# Patient Record
Sex: Female | Born: 1935 | Race: White | Hispanic: No | Smoking: Never smoker
Health system: Southern US, Community
[De-identification: ages and names within clinical notes are randomized; demographics above are authoritative.]

## PROBLEM LIST (undated history)

## (undated) DIAGNOSIS — M810 Age-related osteoporosis without current pathological fracture: Secondary | ICD-10-CM

## (undated) DIAGNOSIS — K219 Gastro-esophageal reflux disease without esophagitis: Secondary | ICD-10-CM

## (undated) DIAGNOSIS — Z974 Presence of external hearing-aid: Secondary | ICD-10-CM

## (undated) DIAGNOSIS — I5031 Acute diastolic (congestive) heart failure: Secondary | ICD-10-CM

## (undated) DIAGNOSIS — N289 Disorder of kidney and ureter, unspecified: Secondary | ICD-10-CM

## (undated) DIAGNOSIS — R42 Dizziness and giddiness: Secondary | ICD-10-CM

## (undated) DIAGNOSIS — H5316 Psychophysical visual disturbances: Secondary | ICD-10-CM

## (undated) DIAGNOSIS — C55 Malignant neoplasm of uterus, part unspecified: Secondary | ICD-10-CM

## (undated) DIAGNOSIS — I10 Essential (primary) hypertension: Secondary | ICD-10-CM

## (undated) DIAGNOSIS — Z9889 Other specified postprocedural states: Secondary | ICD-10-CM

## (undated) DIAGNOSIS — H3530 Unspecified macular degeneration: Secondary | ICD-10-CM

## (undated) DIAGNOSIS — H409 Unspecified glaucoma: Secondary | ICD-10-CM

## (undated) DIAGNOSIS — M4150 Other secondary scoliosis, site unspecified: Secondary | ICD-10-CM

## (undated) DIAGNOSIS — E875 Hyperkalemia: Secondary | ICD-10-CM

## (undated) DIAGNOSIS — I773 Arterial fibromuscular dysplasia: Secondary | ICD-10-CM

## (undated) DIAGNOSIS — E7800 Pure hypercholesterolemia, unspecified: Secondary | ICD-10-CM

## (undated) HISTORY — DX: Hyperkalemia: E87.5

## (undated) HISTORY — DX: Disorder of kidney and ureter, unspecified: N28.9

## (undated) HISTORY — DX: Unspecified macular degeneration: H35.30

## (undated) HISTORY — DX: Pure hypercholesterolemia, unspecified: E78.00

## (undated) HISTORY — DX: Other secondary scoliosis, site unspecified: M41.50

## (undated) HISTORY — DX: Age-related osteoporosis without current pathological fracture: M81.0

## (undated) HISTORY — PX: TOTAL ABDOMINAL HYSTERECTOMY W/ BILATERAL SALPINGOOPHORECTOMY: SHX83

## (undated) HISTORY — PX: REFRACTIVE SURGERY: SHX103

## (undated) HISTORY — DX: Malignant neoplasm of uterus, part unspecified: C55

## (undated) HISTORY — DX: Unspecified glaucoma: H40.9

## (undated) HISTORY — DX: Gastro-esophageal reflux disease without esophagitis: K21.9

## (undated) HISTORY — DX: Essential (primary) hypertension: I10

## (undated) HISTORY — DX: Other specified postprocedural states: Z98.890

## (undated) HISTORY — DX: Hypercalcemia: E83.52

## (undated) HISTORY — DX: Dizziness and giddiness: R42

## (undated) HISTORY — PX: CARDIAC CATHETERIZATION: SHX172

## (undated) HISTORY — DX: Acute diastolic (congestive) heart failure: I50.31

## (undated) HISTORY — DX: Arterial fibromuscular dysplasia: I77.3

---

## 2001-04-02 ENCOUNTER — Encounter: Admission: RE | Admit: 2001-04-02 | Discharge: 2001-04-02 | Payer: Self-pay | Admitting: Internal Medicine

## 2001-04-02 ENCOUNTER — Encounter: Payer: Self-pay | Admitting: Internal Medicine

## 2002-04-10 ENCOUNTER — Encounter: Payer: Self-pay | Admitting: Internal Medicine

## 2002-04-10 ENCOUNTER — Encounter: Admission: RE | Admit: 2002-04-10 | Discharge: 2002-04-10 | Payer: Self-pay | Admitting: Internal Medicine

## 2002-05-28 DIAGNOSIS — Z9889 Other specified postprocedural states: Secondary | ICD-10-CM

## 2002-05-28 HISTORY — DX: Other specified postprocedural states: Z98.890

## 2002-05-29 ENCOUNTER — Ambulatory Visit (HOSPITAL_COMMUNITY): Admission: RE | Admit: 2002-05-29 | Discharge: 2002-05-29 | Payer: Self-pay | Admitting: Cardiology

## 2002-05-29 ENCOUNTER — Encounter: Payer: Self-pay | Admitting: Cardiology

## 2002-06-11 ENCOUNTER — Ambulatory Visit (HOSPITAL_COMMUNITY): Admission: RE | Admit: 2002-06-11 | Discharge: 2002-06-11 | Payer: Self-pay | Admitting: Cardiology

## 2003-04-15 ENCOUNTER — Encounter: Payer: Self-pay | Admitting: Internal Medicine

## 2003-04-15 ENCOUNTER — Encounter: Admission: RE | Admit: 2003-04-15 | Discharge: 2003-04-15 | Payer: Self-pay | Admitting: *Deleted

## 2004-04-14 ENCOUNTER — Ambulatory Visit (HOSPITAL_COMMUNITY): Admission: RE | Admit: 2004-04-14 | Discharge: 2004-04-14 | Payer: Self-pay | Admitting: Neurosurgery

## 2004-06-17 ENCOUNTER — Encounter: Admission: RE | Admit: 2004-06-17 | Discharge: 2004-06-17 | Payer: Self-pay | Admitting: Internal Medicine

## 2004-10-11 ENCOUNTER — Ambulatory Visit: Payer: Self-pay | Admitting: Internal Medicine

## 2004-10-28 ENCOUNTER — Ambulatory Visit: Payer: Self-pay | Admitting: Internal Medicine

## 2005-06-20 ENCOUNTER — Encounter: Admission: RE | Admit: 2005-06-20 | Discharge: 2005-06-20 | Payer: Self-pay | Admitting: Internal Medicine

## 2005-10-17 ENCOUNTER — Ambulatory Visit: Payer: Self-pay | Admitting: Internal Medicine

## 2005-10-28 ENCOUNTER — Ambulatory Visit: Payer: Self-pay | Admitting: Internal Medicine

## 2006-06-30 ENCOUNTER — Ambulatory Visit (HOSPITAL_COMMUNITY): Admission: RE | Admit: 2006-06-30 | Discharge: 2006-06-30 | Payer: Self-pay | Admitting: Neurosurgery

## 2006-07-04 ENCOUNTER — Encounter: Admission: RE | Admit: 2006-07-04 | Discharge: 2006-07-04 | Payer: Self-pay | Admitting: Internal Medicine

## 2006-08-09 ENCOUNTER — Emergency Department: Payer: Self-pay | Admitting: Emergency Medicine

## 2006-08-09 ENCOUNTER — Other Ambulatory Visit: Payer: Self-pay

## 2006-08-23 ENCOUNTER — Ambulatory Visit: Payer: Self-pay | Admitting: Internal Medicine

## 2006-09-13 ENCOUNTER — Encounter: Payer: Self-pay | Admitting: Cardiology

## 2007-04-03 ENCOUNTER — Ambulatory Visit: Payer: Self-pay | Admitting: Gynecologic Oncology

## 2007-04-11 ENCOUNTER — Encounter: Payer: Self-pay | Admitting: Cardiology

## 2007-04-12 ENCOUNTER — Ambulatory Visit: Payer: Self-pay | Admitting: Obstetrics and Gynecology

## 2007-04-12 ENCOUNTER — Ambulatory Visit: Payer: Self-pay | Admitting: Internal Medicine

## 2007-04-13 ENCOUNTER — Inpatient Hospital Stay: Payer: Self-pay | Admitting: Obstetrics and Gynecology

## 2007-05-01 ENCOUNTER — Ambulatory Visit: Payer: Self-pay | Admitting: Gynecologic Oncology

## 2007-05-01 ENCOUNTER — Ambulatory Visit: Payer: Self-pay | Admitting: Radiation Oncology

## 2007-05-29 ENCOUNTER — Ambulatory Visit: Payer: Self-pay | Admitting: Radiation Oncology

## 2007-05-29 ENCOUNTER — Ambulatory Visit: Payer: Self-pay | Admitting: Gynecologic Oncology

## 2007-06-29 ENCOUNTER — Ambulatory Visit: Payer: Self-pay | Admitting: Radiation Oncology

## 2007-07-09 ENCOUNTER — Encounter: Admission: RE | Admit: 2007-07-09 | Discharge: 2007-07-09 | Payer: Self-pay | Admitting: Internal Medicine

## 2007-09-25 ENCOUNTER — Encounter: Payer: Self-pay | Admitting: Cardiology

## 2007-12-11 ENCOUNTER — Ambulatory Visit: Payer: Self-pay | Admitting: Radiation Oncology

## 2007-12-30 ENCOUNTER — Ambulatory Visit: Payer: Self-pay | Admitting: Radiation Oncology

## 2008-02-27 ENCOUNTER — Ambulatory Visit: Payer: Self-pay | Admitting: Gynecologic Oncology

## 2008-03-20 ENCOUNTER — Ambulatory Visit: Payer: Self-pay | Admitting: Obstetrics and Gynecology

## 2008-04-01 ENCOUNTER — Ambulatory Visit: Payer: Self-pay | Admitting: Obstetrics and Gynecology

## 2008-04-28 ENCOUNTER — Ambulatory Visit: Payer: Self-pay | Admitting: Radiation Oncology

## 2008-05-26 ENCOUNTER — Ambulatory Visit: Payer: Self-pay | Admitting: Radiation Oncology

## 2008-05-28 ENCOUNTER — Ambulatory Visit: Payer: Self-pay | Admitting: Radiation Oncology

## 2008-07-16 ENCOUNTER — Ambulatory Visit: Payer: Self-pay | Admitting: Obstetrics and Gynecology

## 2008-07-17 ENCOUNTER — Encounter: Admission: RE | Admit: 2008-07-17 | Discharge: 2008-07-17 | Payer: Self-pay | Admitting: Internal Medicine

## 2008-07-31 ENCOUNTER — Ambulatory Visit: Payer: Self-pay | Admitting: Internal Medicine

## 2008-09-25 ENCOUNTER — Encounter: Payer: Self-pay | Admitting: Cardiology

## 2008-10-16 ENCOUNTER — Encounter: Payer: Self-pay | Admitting: Cardiology

## 2008-10-17 ENCOUNTER — Encounter: Payer: Self-pay | Admitting: Cardiology

## 2008-10-21 ENCOUNTER — Encounter: Payer: Self-pay | Admitting: Cardiology

## 2008-12-11 ENCOUNTER — Ambulatory Visit: Payer: Self-pay | Admitting: Internal Medicine

## 2008-12-18 ENCOUNTER — Ambulatory Visit: Payer: Self-pay | Admitting: Internal Medicine

## 2009-02-17 ENCOUNTER — Encounter: Admission: RE | Admit: 2009-02-17 | Discharge: 2009-02-17 | Payer: Self-pay | Admitting: Neurosurgery

## 2009-03-19 ENCOUNTER — Ambulatory Visit: Payer: Self-pay | Admitting: Obstetrics and Gynecology

## 2009-06-17 ENCOUNTER — Encounter (INDEPENDENT_AMBULATORY_CARE_PROVIDER_SITE_OTHER): Payer: Self-pay | Admitting: *Deleted

## 2009-06-17 LAB — CONVERTED CEMR LAB
Albumin: 4.1 g/dL
Alkaline Phosphatase: 57 units/L
BUN: 34 mg/dL
CO2: 26.4 meq/L
Calcium: 9.9 mg/dL
Cholesterol: 221 mg/dL
Creatinine, Ser: 1.3 mg/dL
HCT: 39.9 %
Hemoglobin: 12.8 g/dL
MCV: 97.8 fL
Platelets: 210 10*3/uL
RBC: 4.08 M/uL
RDW: 13.6 %
Sodium: 139.8 meq/L
Total Bilirubin: 0.8 mg/dL
Total Protein: 7.2 g/dL

## 2009-06-19 ENCOUNTER — Encounter: Payer: Self-pay | Admitting: Cardiology

## 2009-06-23 ENCOUNTER — Other Ambulatory Visit: Payer: Self-pay | Admitting: Internal Medicine

## 2009-07-06 ENCOUNTER — Encounter: Payer: Self-pay | Admitting: Cardiology

## 2009-07-07 ENCOUNTER — Ambulatory Visit: Payer: Self-pay | Admitting: Cardiology

## 2009-07-07 DIAGNOSIS — R0602 Shortness of breath: Secondary | ICD-10-CM | POA: Insufficient documentation

## 2009-07-07 DIAGNOSIS — I5032 Chronic diastolic (congestive) heart failure: Secondary | ICD-10-CM

## 2009-07-08 ENCOUNTER — Encounter (INDEPENDENT_AMBULATORY_CARE_PROVIDER_SITE_OTHER): Payer: Self-pay | Admitting: *Deleted

## 2009-07-09 ENCOUNTER — Encounter: Payer: Self-pay | Admitting: Cardiology

## 2009-07-10 ENCOUNTER — Encounter: Payer: Self-pay | Admitting: Cardiology

## 2009-07-14 ENCOUNTER — Encounter: Payer: Self-pay | Admitting: Cardiology

## 2009-07-14 ENCOUNTER — Ambulatory Visit: Payer: Self-pay

## 2009-07-19 ENCOUNTER — Emergency Department: Payer: Self-pay | Admitting: Emergency Medicine

## 2009-07-20 ENCOUNTER — Telehealth (INDEPENDENT_AMBULATORY_CARE_PROVIDER_SITE_OTHER): Payer: Self-pay | Admitting: *Deleted

## 2009-07-22 ENCOUNTER — Ambulatory Visit: Payer: Self-pay | Admitting: Internal Medicine

## 2009-07-23 ENCOUNTER — Telehealth (INDEPENDENT_AMBULATORY_CARE_PROVIDER_SITE_OTHER): Payer: Self-pay | Admitting: *Deleted

## 2009-07-27 ENCOUNTER — Encounter: Payer: Self-pay | Admitting: Cardiology

## 2009-07-27 ENCOUNTER — Telehealth: Payer: Self-pay | Admitting: Cardiology

## 2009-08-05 ENCOUNTER — Telehealth (INDEPENDENT_AMBULATORY_CARE_PROVIDER_SITE_OTHER): Payer: Self-pay | Admitting: *Deleted

## 2009-08-16 ENCOUNTER — Encounter: Payer: Self-pay | Admitting: Cardiology

## 2009-09-02 ENCOUNTER — Telehealth (INDEPENDENT_AMBULATORY_CARE_PROVIDER_SITE_OTHER): Payer: Self-pay | Admitting: Physician Assistant

## 2009-09-08 ENCOUNTER — Ambulatory Visit: Payer: Self-pay | Admitting: Cardiovascular Disease

## 2009-09-08 ENCOUNTER — Telehealth: Payer: Self-pay | Admitting: Cardiology

## 2009-09-08 DIAGNOSIS — R079 Chest pain, unspecified: Secondary | ICD-10-CM

## 2009-09-16 ENCOUNTER — Ambulatory Visit: Payer: Self-pay

## 2009-09-16 ENCOUNTER — Encounter: Payer: Self-pay | Admitting: Cardiovascular Disease

## 2009-09-16 ENCOUNTER — Ambulatory Visit: Payer: Self-pay | Admitting: Cardiology

## 2009-09-17 ENCOUNTER — Telehealth: Payer: Self-pay | Admitting: Cardiology

## 2009-09-17 ENCOUNTER — Encounter: Payer: Self-pay | Admitting: Cardiology

## 2009-09-22 ENCOUNTER — Ambulatory Visit: Payer: Self-pay | Admitting: Cardiology

## 2009-09-22 DIAGNOSIS — E7800 Pure hypercholesterolemia, unspecified: Secondary | ICD-10-CM

## 2009-10-30 ENCOUNTER — Ambulatory Visit: Payer: Self-pay | Admitting: Cardiology

## 2009-10-30 DIAGNOSIS — R609 Edema, unspecified: Secondary | ICD-10-CM | POA: Insufficient documentation

## 2009-11-03 ENCOUNTER — Ambulatory Visit: Payer: Self-pay

## 2009-11-03 ENCOUNTER — Encounter: Payer: Self-pay | Admitting: Cardiology

## 2009-11-05 ENCOUNTER — Encounter: Payer: Self-pay | Admitting: Cardiology

## 2009-11-05 ENCOUNTER — Ambulatory Visit: Payer: Self-pay | Admitting: Cardiology

## 2009-11-09 LAB — CONVERTED CEMR LAB
BUN: 44 mg/dL — ABNORMAL HIGH (ref 6–23)
CO2: 22 meq/L (ref 19–32)
Calcium: 10.1 mg/dL (ref 8.4–10.5)
Glucose, Bld: 102 mg/dL — ABNORMAL HIGH (ref 70–99)
Sodium: 134 meq/L — ABNORMAL LOW (ref 135–145)

## 2009-11-24 ENCOUNTER — Ambulatory Visit: Payer: Self-pay | Admitting: Cardiology

## 2009-11-30 LAB — CONVERTED CEMR LAB
CO2: 17 meq/L — ABNORMAL LOW (ref 19–32)
Calcium: 10.3 mg/dL (ref 8.4–10.5)
Creatinine, Ser: 1.37 mg/dL — ABNORMAL HIGH (ref 0.40–1.20)
Glucose, Bld: 88 mg/dL (ref 70–99)
Sodium: 139 meq/L (ref 135–145)

## 2010-01-12 ENCOUNTER — Encounter: Payer: Self-pay | Admitting: Cardiovascular Disease

## 2010-02-01 ENCOUNTER — Encounter: Payer: Self-pay | Admitting: Cardiovascular Disease

## 2010-02-01 LAB — CONVERTED CEMR LAB
CO2: 22 meq/L
Calcium: 9.7 mg/dL
Creatinine, Ser: 1.32 mg/dL
Glucose, Bld: 87 mg/dL
Sodium: 137 meq/L

## 2010-02-03 ENCOUNTER — Ambulatory Visit: Payer: Self-pay | Admitting: Cardiovascular Disease

## 2010-02-04 ENCOUNTER — Telehealth: Payer: Self-pay | Admitting: Cardiovascular Disease

## 2010-02-05 ENCOUNTER — Encounter: Payer: Self-pay | Admitting: Cardiovascular Disease

## 2010-03-16 ENCOUNTER — Telehealth: Payer: Self-pay | Admitting: Cardiovascular Disease

## 2010-05-18 ENCOUNTER — Other Ambulatory Visit: Payer: Self-pay | Admitting: Internal Medicine

## 2010-05-20 ENCOUNTER — Ambulatory Visit: Payer: Self-pay | Admitting: Internal Medicine

## 2010-06-01 ENCOUNTER — Other Ambulatory Visit: Payer: Self-pay | Admitting: Internal Medicine

## 2010-06-04 ENCOUNTER — Ambulatory Visit: Payer: Self-pay | Admitting: Surgery

## 2010-06-08 ENCOUNTER — Encounter: Payer: Self-pay | Admitting: Cardiovascular Disease

## 2010-06-15 ENCOUNTER — Encounter: Payer: Self-pay | Admitting: Cardiovascular Disease

## 2010-06-16 ENCOUNTER — Ambulatory Visit: Payer: Self-pay | Admitting: Cardiovascular Disease

## 2010-09-02 ENCOUNTER — Ambulatory Visit: Payer: Self-pay | Admitting: Obstetrics and Gynecology

## 2010-10-12 ENCOUNTER — Ambulatory Visit: Payer: Self-pay | Admitting: Nephrology

## 2010-12-20 ENCOUNTER — Encounter: Payer: Self-pay | Admitting: Neurosurgery

## 2010-12-28 NOTE — Progress Notes (Signed)
Summary: BP PROBLEMS  Phone Note Call from Patient Call back at Home Phone [phone]   Caller: SELF Call For: GOLLAN Summary of Call: PT IS HAVING BP PROBLEMS-LOSARTIN HAS REPLACED AVAPRO-KIDNEY DR UPPED FUROSEMIDE TO 40 MG IN THE MORNING-TAKING BYSTOLLIC 2 X A DAY-BP STARTED 629/52 AND PULSE 66 NOW IT IS 154/73 WITH A PULSE OF 58 Initial call taken by: Harlon Flor,  March 16, 2010 10:41 AM  Follow-up for Phone Call        pt instructed to use hydralazine as needed as previously ordered.  pt will take a 1/2 tab now and will call back if BP has not improved (but only after taking a full pill).  Follow-up by: Charlena Cross, RN, BSN,  March 16, 2010 11:01 AM

## 2010-12-28 NOTE — Progress Notes (Signed)
Summary: MEDS  Phone Note Call from Patient Call back at Home Phone [phone]   Caller: HUSBAND Call For: [facility] Summary of Call: QUESTIONS ABOUT MEDS THAT WERE PICKED UP YESTERDAY #[phone] Initial call taken by: Harlon Flor,  February 04, 2010 8:41 AM  Follow-up for Phone Call        pts husband called to clarify medications- instructed pt that there was no generic for Avapro so she was placed on cozaar (losartan) 100 mg daily.  Pt was under the impression that she would be taking 50 mg twice a day, instructed pt that she was able to cut medication in half if that would be better for her.  Instructed her on dosing instructions for hydralazine as they did not wait after appt yesterday.  will call pt back later once we rec labs from kidney MD in regards to lasix dose. Follow-up by: Charlena Cross, RN, BSN,  February 04, 2010 9:46 AM

## 2010-12-28 NOTE — Assessment & Plan Note (Signed)
Summary: rov   Visit Type:  rov Referring Provider:  Shirlee Latch Primary Provider:  Dale   CC:  here due to insurance not wanting to pay for avapro. had a cold for 2 weeks and took lots of zinc cough drops about a week. Jill Castro  History of Present Illness: 77 yo WF with history of HTN and diastolic CHF presents for followup.  catheterization in 2003 showing luminal irregularities, negative stress test in October 2010 with no ischemia, echocardiogram August 2010 showing normal systolic function, mild to moderate tricuspid regurgitation, mildly elevated right ventricular systolic pressures consistent with mild pulmonary hypertension, ejection fraction 60%.  She states that she had a terrible fall late last year and call significant injury to her right lower leg below the knee with severe bruising. She's been able to do some treadmill though continues to have pain in the lower leg as well as the right ankle.  Her blood pressures have been mildly elevated with systolics in the 140s to 150s. She checks them 4 times a day with her husband's help.  Labs (8/10): LDL 130, HDL 66, TSH normal, creatinine 1.3  Current Problems (verified): 1)  Edema  (ICD-782.3) 2)  Hyperlipidemia-mixed  (ICD-272.4) 3)  Chest Pain  (ICD-786.50) 4)  Hypertension, Unspecified  (ICD-401.9) 5)  Diastolic Heart Failure, Chronic  (ICD-428.32) 6)  Shortness of Breath  (ICD-786.05)  Current Medications (verified): 1)  Dorzolamide Hcl 2 % Soln (Dorzolamide Hcl) .Jill Castro.. 1 Drop in Each Eye Morning & Evening 2)  Travatan Z 0.004 % Soln (Travoprost) .Jill Castro.. 1 Drop in Each Eye At Bedtime 3)  Omeprazole 20 Mg Tbec (Omeprazole) .Jill Castro.. 1-2 Tab Once Daily 4)  Avapro 150 Mg Tabs (Irbesartan) .Jill Castro.. 1 Tab Two Times A Day 5)  Furosemide 20 Mg Tabs (Furosemide) .... 2 By Mouth Once Daily 6)  Multivitamins   Tabs (Multiple Vitamin) .Jill Castro.. 1 Tab Once Daily 7)  Aspirin 81 Mg Tbec (Aspirin) .... Take One Tablet By Mouth Daily 8)  Vitamin C 500 Mg  Tabs (Ascorbic Acid) .Jill Castro.. 1 Tab Once Daily 9)  Vitamin D 400 Unit Caps (Cholecalciferol) .Jill Castro.. 1 Cap Once Daily 10)  B-Complex/b-12  Tabs (B Complex Vitamins) .Jill Castro.. 1 Tab Once Daily 11)  Magnesium 500 Mg Tabs (Magnesium) .Jill Castro.. 1 Tab Once Daily 12)  Glucosamine-Chondroitin 1500-1200 Mg/60ml Liqd (Glucosamine-Chondroitin) .Jill Castro.. 1 Tab  Once Daily 13)  Docusate Sodium 100 Mg Caps (Docusate Sodium) .Jill Castro.. 1 Cap At Bedtime 14)  Bystolic 2.5 Mg Tabs (Nebivolol Hcl) .Jill Castro.. 1 Tab By Mouth  Two Times A Day 15)  Simvastatin 10 Mg Tabs (Simvastatin) .... Take One Tablet By Mouth Daily At Bedtime 16)  Miralax  Powd (Polyethylene Glycol 3350) .... Once Daily 17)  Vicodin 5-500 Mg Tabs (Hydrocodone-Acetaminophen) .Jill Castro.. 1 Tablet Every 4-6 Hours As Needed For Pain (From Hemmroid Procedure)  Allergies (verified): 1)  ! Pcn  Past History:  Past Medical History: Last updated: 09/22/2009 1.  LHC (7/03): luminal irregularities only in the coronaries.  EF 55%.  There was some irregularity in the right renal artery suggestive of possible fibromuscular dysplasia.   Myoview in 5/08 showed no ischemia or infarction.  Lexiscan myoview at [facility] (10/10) showed EF 55%, normal wall motion, no evidence for ischemia or infarction.  2.  Diastolic CHF: Echo (8/10) with EF 60-65%, mild LVH, mild to moderate TR, PASP 40 mmHg.  3.  HTN 4.  Dizziness thought to be side effect of Norvasc.  5.  Hypercalcemia on HCTZ 6.  Scoliosis with chronic  back pain.  7.  GERD 8.  Uterine cancer s/p hysterectomy in 2008 9.  Hyperkalemia  Past Surgical History: Last updated: 07/07/2009 noncontributory  Family History: Last updated: 07/07/2009 noncontributory  Social History: Last updated: 07/07/2009 Lives with husband in [place].  Nonsmoker, no ETOH.    Review of Systems       The patient complains of peripheral edema and difficulty walking.  The patient denies fever, weight loss, weight gain, vision loss, decreased hearing, hoarseness,  chest pain, syncope, prolonged cough, abdominal pain, incontinence, muscle weakness, depression, and enlarged lymph nodes.         right leg pain,   Vital Signs:  Patient profile:   77 year old female Height:      65 inches Weight:      151.25 pounds BMI:     25.26 Pulse rate:   64 / minute Pulse rhythm:   regular BP sitting:   172 / 68  (left arm) Cuff size:   regular  Vitals Entered By: Mercer Pod (February 03, 2010 11:52 AM)  Physical Exam  General:  Elderly woman in no apparent distress, neck is supple with no JVP or carotid bruits, heart sounds are regular with normal S1-S2 and no murmurs appreciated, lungs are clear to auscultation with no wheezes or rales, abdominal exam is benign, no significant lower extremity edema, neurologic exam is grossly nonfocal, skin is warm and dry   Impression & Recommendations:  Problem # 1:  HYPERTENSION, UNSPECIFIED (ICD-401.9) Blood pressure continues to be elevated with systolics in the 140s to 150s. Most notably it is elevated in the morning. We have started hydralazine for her to take in the evenings. We have suggested that she try 25 mg tablet, one half a dose in the evening to start and titrate this up to a full tablet for blood pressure. She can also take this during the daytime if her systolics are greater than 150 at rest.  She has had problems with amlodipine with dizziness and has had hypercalcemia with HCTZ. We will change her Avapro to losartan 100 mg daily as she states her insurance company wants her to try a generic.  The following medications were removed from the medication list:    Avapro 150 Mg Tabs (Irbesartan) .Jill Castro... 1 tab two times a day Her updated medication list for this problem includes:    Furosemide 20 Mg Tabs (Furosemide) .Jill Castro... 2 by mouth once daily    Aspirin 81 Mg Tbec (Aspirin) .Jill Castro... Take one tablet by mouth daily    Bystolic 2.5 Mg Tabs (Nebivolol hcl) .Jill Castro... 1 tab by mouth  two times a day    Hydralazine  Hcl 25 Mg Tabs (Hydralazine hcl) .Jill Castro... Take one tablet by mouth two times a day as needed for hypertension    Cozaar 100 Mg Tabs (Losartan potassium) .Jill Castro... 1 tab by mouth every morning  Problem # 2:  HYPERLIPIDEMIA-MIXED (ICD-272.4) She is currently taking simvastatin and tolerating this well. in October 2010, total cholesterol was 160, LDL 72 , HDL greater than 60.She also takes aspirin 81 mg daily.  Her updated medication list for this problem includes:    Simvastatin 10 Mg Tabs (Simvastatin) .Jill Castro... Take one tablet by mouth daily at bedtime  Problem # 3:  DIASTOLIC HEART FAILURE, CHRONIC (ICD-428.32) She does have a history of diastolic dysfunction and mild pulmonary hypertension. She is currently on Lasix 20 mg b.i.d. She had lab work recently by her primary care physician and we will try  to evaluate this his visit is faxed to our office. Currently with no signs of heart failure.  The following medications were removed from the medication list:    Avapro 150 Mg Tabs (Irbesartan) .Jill Castro... 1 tab two times a day Her updated medication list for this problem includes:    Furosemide 20 Mg Tabs (Furosemide) .Jill Castro... 2 by mouth once daily    Aspirin 81 Mg Tbec (Aspirin) .Jill Castro... Take one tablet by mouth daily    Bystolic 2.5 Mg Tabs (Nebivolol hcl) .Jill Castro... 1 tab by mouth  two times a day    Cozaar 100 Mg Tabs (Losartan potassium) .Jill Castro... 1 tab by mouth every morning  Patient Instructions: 1)  Your physician recommends that you schedule a follow-up appointment in: 6 months 2)  Your physician has recommended you make the following change in your medication: stop avapro, start losartan 100 mg every morning, start hydralazine 25 mg twice a day as needed for hypertenison Prescriptions: COZAAR 100 MG TABS (LOSARTAN POTASSIUM) 1 tab by mouth every morning  #30 x 3   Entered by:   Charlena Cross, RN, BSN   Authorized by:   Dossie Arbour MD   Signed by:   Charlena Cross, RN, BSN on 02/03/2010   Method used:    Electronically to        [facility] [address] (retail)       [address] [place], [place]  [address]       Ph: [phone]       Fax: [phone]   RxID:   [id number] HYDRALAZINE HCL 25 MG TABS (HYDRALAZINE HCL) Take one tablet by mouth two times a day as needed for hypertension  #60 x 3   Entered by:   Charlena Cross, RN, BSN   Authorized by:   Dossie Arbour MD   Signed by:   Charlena Cross, RN, BSN on 02/03/2010   Method used:   Electronically to        [facility] [address] (retail)       [address] [place], [place]  [address]       Ph: [phone]       Fax: [phone]   RxID:   [id number]

## 2010-12-28 NOTE — Progress Notes (Signed)
Summary: Office Visit  Office Visit   Imported By: Frazier Butt Chriscoe 06/16/2010 14:20:15  _____________________________________________________________________  External Attachment:    Type:   Image     Comment:   External Document

## 2010-12-28 NOTE — Miscellaneous (Signed)
Summary: labs from [facility]  Clinical Lists Changes  Observations: Added new observation of CALCIUM: 9.7 mg/dL (81/19/1478 29:56) Added new observation of CO2 PLSM/SER: 22 meq/L (02/01/2010 12:51) Added new observation of CL SERUM: 101 meq/L (02/01/2010 12:51) Added new observation of K SERUM: 3.9 meq/L (02/01/2010 12:51) Added new observation of NA: 137 meq/L (02/01/2010 12:51) Added new observation of CREATININE: 1.32 mg/dL (21/30/8657 84:69) Added new observation of BUN: 34 mg/dL (62/95/2841 32:44) Added new observation of BG RANDOM: 87 mg/dL (11/30/7251 66:44)      -  Date:  02/01/2010    BG Random: 87    BUN: 34    Creatinine: 1.32    Sodium: 137    Potassium: 3.9    Chloride: 101    CO2 Total: 22    Calcium: 9.7

## 2010-12-28 NOTE — Assessment & Plan Note (Signed)
Summary: ROV/GLC   Visit Type:  Follow-up Referring Provider:  Shirlee Latch Primary Provider:  Dale [place]  CC:  c/o swelling in legs and feet and ankles..  History of Present Illness: 77 yo WF with history of HTN and diastolic CHF presents for followup.   Ms. Gudgel has been doing very well. She presents her blood pressures over the past several weeks and they are under excellent control with systolics typically in the 120s to 130s. She has no dizzy spells. She is working out on her treadmill 10 minutes per day though does have chronic back pain. She does have edema in her left lower extremity and has been taking diuretic daily. She was taking 40 mg daily her creatinine climbed. Since she cut back on her diuretic, her creatinine has improved to 1.4 last week.  catheterization in 2003 showing luminal irregularities,   negative stress test in October 2010 with no ischemia,   echocardiogram August 2010 showing normal systolic function, mild to moderate tricuspid regurgitation, mildly elevated right ventricular systolic pressures consistent with mild pulmonary hypertension, ejection fraction 60%.  Labs (8/10): LDL 130, HDL 66, TSH normal, creatinine 1.3  Current Medications (verified): 1)  Dorzolamide Hcl 2 % Soln (Dorzolamide Hcl) .Marland Kitchen.. 1 Drop in Each Eye Morning & Evening 2)  Travatan Z 0.004 % Soln (Travoprost) .Marland Kitchen.. 1 Drop in Each Eye At Bedtime 3)  Omeprazole 20 Mg Tbec (Omeprazole) .Marland Kitchen.. 1-2 Tab Once Daily 4)  Furosemide 20 Mg Tabs (Furosemide) .... 2 By Mouth Once Daily 5)  Multivitamins   Tabs (Multiple Vitamin) .Marland Kitchen.. 1 Tab Once Daily 6)  Aspirin 81 Mg Tbec (Aspirin) .... Take One Tablet By Mouth Daily 7)  Vitamin C 500 Mg Tabs (Ascorbic Acid) .Marland Kitchen.. 1 Tab Once Daily 8)  Vitamin D 400 Unit Caps (Cholecalciferol) .Marland Kitchen.. 1 Cap Once Daily 9)  B-Complex/b-12  Tabs (B Complex Vitamins) .Marland Kitchen.. 1 Tab Once Daily 10)  Magnesium 500 Mg Tabs (Magnesium) .Marland Kitchen.. 1 Tab Once Daily 11)   Glucosamine-Chondroitin 1500-1200 Mg/67ml Liqd (Glucosamine-Chondroitin) .Marland Kitchen.. 1 Tab  Once Daily 12)  Bystolic 2.5 Mg Tabs (Nebivolol Hcl) .Marland Kitchen.. 1 Tab By Mouth  Two Times A Day 13)  Simvastatin 10 Mg Tabs (Simvastatin) .... Take One Tablet By Mouth Daily At Bedtime 14)  Hydralazine Hcl 25 Mg Tabs (Hydralazine Hcl) .... Take One Tablet By Mouth Two Times A Day As Needed For Hypertension 15)  Cozaar 50 Mg Tabs (Losartan Potassium) .Marland Kitchen.. 1 Once Daily  Allergies (verified): 1)  ! Pcn  Past History:  Past Medical History: Last updated: 09/22/2009 1.  LHC (7/03): luminal irregularities only in the coronaries.  EF 55%.  There was some irregularity in the right renal artery suggestive of possible fibromuscular dysplasia.   Myoview in 5/08 showed no ischemia or infarction.  Lexiscan myoview at [facility] (10/10) showed EF 55%, normal wall motion, no evidence for ischemia or infarction.  2.  Diastolic CHF: Echo (8/10) with EF 60-65%, mild LVH, mild to moderate TR, PASP 40 mmHg.  3.  HTN 4.  Dizziness thought to be side effect of Norvasc.  5.  Hypercalcemia on HCTZ 6.  Scoliosis with chronic back pain.  7.  GERD 8.  Uterine cancer s/p hysterectomy in 2008 9.  Hyperkalemia  Past Surgical History: Last updated: 07/07/2009 noncontributory  Family History: Last updated: 07/07/2009 noncontributory  Social History: Last updated: 07/07/2009 Lives with husband in [place].  Nonsmoker, no ETOH.    Review of Systems       The patient  complains of difficulty walking.  The patient denies fever, weight loss, weight gain, vision loss, decreased hearing, hoarseness, chest pain, syncope, dyspnea on exertion, peripheral edema, prolonged cough, abdominal pain, incontinence, muscle weakness, depression, and enlarged lymph nodes.    Vital Signs:  Patient profile:   77 year old female Height:      65 inches Weight:      144 pounds Pulse rate:   61 / minute BP sitting:   176 / 102  (right arm) Cuff size:    regular  Vitals Entered By: Bishop Dublin, CMA (June 16, 2010 10:38 AM)  Physical Exam  General:  Well developed, well nourished, in no acute distress. Head:  normocephalic and atraumatic Neck:  Neck supple, no JVD. No masses, thyromegaly or abnormal cervical nodes. Lungs:  Clear bilaterally to auscultation and percussion. Heart:  Non-displaced PMI, chest non-tender; regular rate and rhythm, S1, S2 without murmurs, rubs or gallops. Carotid upstroke normal, no bruit.  Pedals normal pulses. No edema, no varicosities. Abdomen:  Bowel sounds positive; abdomen soft and non-tender without masses Msk:  Back normal, normal gait. Muscle strength and tone normal. Pulses:  pulses normal in all 4 extremities Extremities:  No clubbing or cyanosis. Neurologic:  Alert and oriented x 3. Skin:  Intact without lesions or rashes. Psych:  Normal affect.   Impression & Recommendations:  Problem # 1:  HYPERTENSION, UNSPECIFIED (ICD-401.9) blood pressure is well controlled on Cozaar and low-dose beta blocker. She does take hydralazine p.r.n. for hypertension with systolics greater than 140. No changes made to his regimen.  Her updated medication list for this problem includes:    Furosemide 20 Mg Tabs (Furosemide) .Marland Kitchen... 2 by mouth once daily    Aspirin 81 Mg Tbec (Aspirin) .Marland Kitchen... Take one tablet by mouth daily    Bystolic 2.5 Mg Tabs (Nebivolol hcl) .Marland Kitchen... 1 tab by mouth  two times a day    Hydralazine Hcl 25 Mg Tabs (Hydralazine hcl) .Marland Kitchen... Take one tablet by mouth two times a day as needed for hypertension    Cozaar 50 Mg Tabs (Losartan potassium) .Marland Kitchen... 1 once daily  Problem # 2:  EDEMA (ICD-782.3) on echocardiogram, she does have a history of mild pulmonary hypertension. I have suggested that she could take her Lasix as needed for shortness of breath or edema. On Lasix daily, her creatinine is borderline elevated.  Problem # 3:  DIASTOLIC HEART FAILURE, CHRONIC (ICD-428.32) well-controlled with Lasix  and good blood pressure control. No signs of heart failure. No further medication changes made.  Her updated medication list for this problem includes:    Furosemide 20 Mg Tabs (Furosemide) .Marland Kitchen... 2 by mouth once daily    Aspirin 81 Mg Tbec (Aspirin) .Marland Kitchen... Take one tablet by mouth daily    Bystolic 2.5 Mg Tabs (Nebivolol hcl) .Marland Kitchen... 1 tab by mouth  two times a day    Cozaar 50 Mg Tabs (Losartan potassium) .Marland Kitchen... 1 once daily  Orders: EKG w/ Interpretation (93000)  Patient Instructions: 1)  Your physician recommends that you continue on your current medications as directed. Please refer to the Current Medication list given to you today. 2)  Your physician wants you to follow-up in:   1 year You will receive a reminder letter in the mail two months in advance. If you don't receive a letter, please call our office to schedule the follow-up appointment.  Appended Document: ROV/GLC EKG shows normal sinus rhythm with rate 65 beats per minute, no significant ST or T  wave changes.

## 2011-01-06 ENCOUNTER — Telehealth: Payer: Self-pay | Admitting: Cardiovascular Disease

## 2011-01-13 NOTE — Progress Notes (Signed)
Summary: BP running higher than usual  Phone Note Call from Patient Call back at [facility] Phone [phone]   Caller: Self Call For: Derrell Milanes Summary of Call: BP is running higher than usual in the am-pt has been taking medication in the afternoon.  Pt has been taking 1/2 of a Losartan and would like to know if she should increase the dosage. Initial call taken by: Harlon Flor,  January 06, 2011 10:27 AM  Follow-up for Phone Call        Spoke to pt, she states her BP has been running higher lately.  Today it was 138/70 HR63 after taking am dose of losartan 100mg  1/2 tablet. Later BP was 158/79 HR 60, pt took as needed dose of Hydralazine 25mg  and BP still remains 151/75 HR 59. Informed pt that she can take another 1/2 tablet of losartan since Rx reads that she can have 1/2 - 1 tablet once daily. Advised pt to monitor BP after doing so, and pt will take Losartan 100mg  1 tablet once daily and still continue to take Hydralazine as needed. Pt will call me on Monday after documenting BP's through weekend to notify with any changes. If BP still remains elevated will see if Dr. Mariah Milling wants to see pt or if he wants to change any medications. Pt ok with this. Follow-up by: Lanny Hurst RN,  January 06, 2011 11:43 AM

## 2011-04-15 NOTE — Cardiovascular Report (Signed)
[place]. [facility]  Patient:    TALISE, SLIGH Visit Number: [id number] MRN: [id number]          Service Type: CAT Location: [facility] [id number] Attending Physician:  Ronaldo Miyamoto Dictated by:   Arturo Morton Riley Kill, M.D. [facility] Proc. Date: 05/29/02 Admit Date:  05/29/2002 Discharge Date: 05/29/2002   CC:         Dale Groveton, M.D., [facility], [place]. Jens Som, M.D. [facility]  Arnell Sieving, M.D.   Cardiac Catheterization  INDICATIONS:  Ms. Castronova is a 77 year old [place] who presents with an episode of rapid heart rate in the emergency room.  She was subsequently seen by Dr. Lorin Picket and underwent an echocardiogram that was done as a stress study. The baseline echocardiogram read by Dr. Valetta Close revealed normal LV systolic function, peak stress images revealed anterior, anteroseptal, and inferoseptal hypokinesis.  Apparently an echocardiogram done in February of 2002 which revealed mild to moderate TR.  She was seen by Dr. Jens Som and subsequently referred for diagnostic cardiac catheterization.  PROCEDURE: 1. Left heart catheterization. 2. Selective coronary arteriography. 3. Selective left ventriculography. 4. Distal aortography without runoff.  DESCRIPTION OF PROCEDURE:  The procedure was performed from the right femoral artery using 6 French catheters.  She tolerated the procedure without complications.  Prior to starting we obtained an ISTAT creatinine which was 1.2.  We therefore, tried to conserve on contrast use.  Multiple images were obtained.  We eventually went into the review area and reviewed the studies with her husband.  She was taken to the holding area in satisfactory clinical condition.  HEMODYNAMIC DATA: 1. Central aortic pressure 150/76. 2. Left ventricle 158/18. 3. No gradient on pullback across the aortic valve.  ANGIOGRAPHIC DATA: 1. Ventriculography was performed in the RAO  projection.  Because of    ventricular ectope, it was difficult to calculate ejection fraction,    however, systolic function appeared to be well preserved with an ejection    fraction in excess of 55%.  No segmental abnormalities or contraction were    identified. The aortic leaflets appeared to open well.  There did not    appear to be significant mitral regurgitation. 2. Distal aortography revealed marked tortuosity of the aorta.  There was mild    luminal irregularities of the aorta and also the right iliac.  No high    grade areas of stenosis were noted.  The right renal artery does    demonstrate lumpy bumpy distal irregularity, and this would raise the    question of fibromuscular dysplasia.  I did review this with Dr. Jens Som. 3. The left main coronary artery was free of critical disease. 4. The left anterior descending artery coursed to the apex.  It was a large    caliber vessel that was more or less smooth throughout its entire course.    There was one major diagonal branch.  The LAD proper appeared to be free of    significant disease. 5. The circumflex provided a marginal branch which bifurcated and then an    AV circumflex which provided a codominant system for the inferior wall.    There was perhaps 20% narrowing in the distal circumflex system. 6. The right coronary artery was a large caliber vessel that provided a    posterior descending and small posterolateral system.  There was perhaps    20% mid narrowing, but no high grade areas of disease.  CONCLUSION: 1. Normal  left ventricular function. 2. No critical disease of the coronary arterial tree, other than the minor    luminal irregularities as noted above. 3. Lumpy bumpy irregularity of the distal right renal artery, question    fibromuscular dysplasia. 4. Borderline abnormal chest x-ray with question of basal nodule suggesting    a repeat study.  PLAN: 1. I have asked the patient to return to Dr. Jens Som.  I  have also asked the    patients husband to try to obtain a copy of their old x-ray and possibly    bring this to [facility] to review with Dr. Lyman Bishop. 2. Follow up with Madolyn Frieze. Jens Som, M.D. [facility] 3. Consideration may be given to getting urinary studies to rule out    theochromocytoma. 4. Further plans per Dr. Jens Som. Dictated by:   Arturo Morton Riley Kill, M.D. [facility] Attending Physician:  Ronaldo Miyamoto DD:  05/29/02 TD:  06/02/02 Job: [id number]

## 2011-05-09 ENCOUNTER — Encounter: Payer: Self-pay | Admitting: Cardiovascular Disease

## 2011-05-17 ENCOUNTER — Ambulatory Visit: Payer: Self-pay | Admitting: Physical Medicine and Rehabilitation

## 2011-05-30 ENCOUNTER — Encounter: Payer: Self-pay | Admitting: Cardiovascular Disease

## 2011-05-30 ENCOUNTER — Ambulatory Visit (INDEPENDENT_AMBULATORY_CARE_PROVIDER_SITE_OTHER): Payer: Medicare Other | Admitting: Cardiovascular Disease

## 2011-05-30 DIAGNOSIS — I5032 Chronic diastolic (congestive) heart failure: Secondary | ICD-10-CM

## 2011-05-30 DIAGNOSIS — R0602 Shortness of breath: Secondary | ICD-10-CM

## 2011-05-30 DIAGNOSIS — I10 Essential (primary) hypertension: Secondary | ICD-10-CM

## 2011-05-30 DIAGNOSIS — I509 Heart failure, unspecified: Secondary | ICD-10-CM

## 2011-05-30 DIAGNOSIS — E785 Hyperlipidemia, unspecified: Secondary | ICD-10-CM

## 2011-05-30 DIAGNOSIS — R609 Edema, unspecified: Secondary | ICD-10-CM

## 2011-05-30 MED ORDER — HYDRALAZINE HCL 25 MG PO TABS: 25.0000 mg | ORAL_TABLET | Freq: Two times a day (BID) | ORAL | Status: DC

## 2011-05-30 NOTE — Assessment & Plan Note (Signed)
We have suggested that she start hydralazine 25 mg b.i.d. For her blood pressure. Her husband is very concerned about hypertension and underlying renal dysfunction. They can take hydralazine on a regular basis as well as as needed. We could increase the losartan to 100 mg daily. There was some discussion between the patient and Mr. Beaudoin as to whether the renal physician had decreased the losartan. We will keep it at 50 mg for now.

## 2011-05-30 NOTE — Assessment & Plan Note (Signed)
Cholesterol is at goal on the current lipid regimen. No changes to the medications were made.  

## 2011-05-30 NOTE — Assessment & Plan Note (Signed)
No signs of diastolic dysfunction. She appears euvolemic. Lungs are clear, no significant shortness of breath. Continue Lasix daily.

## 2011-05-30 NOTE — Progress Notes (Signed)
   [name], female    DOB: 08/27/1927, 77 y.o.   MRN: [id number]  HPI Comments: 77 yo WF with history of HTN and diastolic CHF presents for followup.  She has severe scoliosis and chronic back pain and requires pain medication.   catheterization in 2003 showing luminal irregularities, negative stress test in October 2010 with no ischemia, echocardiogram August 2010 showing normal systolic function, mild to moderate tricuspid regurgitation, mildly elevated right ventricular systolic pressures consistent with mild pulmonary hypertension, ejection fraction 60%.   She reports that her back continues to bother her to a significant degree. She has not been able to exercise secondary to her back. She reports her blood pressures meticulously, 5 times per day. Recent blood pressures have shown occasional 140 and 150 systolic. Prior to that, pressures were typically in the 120s to 130s. She is uncertain if this is secondary to increased pain recently  EKG shows normal sinus rhythm with rate 63 beats per minute, first degree AV block, no significant ST or T wave changes. LVH by voltage criteria        Review of Systems  Constitutional: Negative.   HENT: Negative.   Eyes: Negative.   Respiratory: Negative.   Cardiovascular: Negative.   Gastrointestinal: Negative.   Musculoskeletal: Positive for back pain.  Skin: Negative.   Neurological: Negative.   Hematological: Negative.   Psychiatric/Behavioral: Negative.   All other systems reviewed and are negative.    BP 142/90  Pulse 63  Ht 5\' 3"  (1.6 m)  Wt 146 lb (66.225 kg)  BMI 25.86 kg/m2  Physical Exam  Nursing note and vitals reviewed. Constitutional: She is oriented to person, place, and time. She appears well-developed and well-nourished.  HENT:  Head: Normocephalic.  Nose: Nose normal.  Mouth/Throat: Oropharynx is clear and moist.  Eyes: Conjunctivae are normal. Pupils are equal, round, and reactive to light.    Neck: Normal range of motion. Neck supple. No JVD present.  Cardiovascular: Normal rate, regular rhythm, S1 normal, S2 normal, normal heart sounds and intact distal pulses.  Exam reveals no gallop and no friction rub.   No murmur heard.      Nonpitting edema, worse on the left below the knee  Pulmonary/Chest: Effort normal and breath sounds normal. No respiratory distress. She has no wheezes. She has no rales. She exhibits no tenderness.  Abdominal: Soft. Bowel sounds are normal. She exhibits no distension. There is no tenderness.  Musculoskeletal: Normal range of motion. She exhibits no edema and no tenderness.       Severe scoliosis  Lymphadenopathy:    She has no cervical adenopathy.  Neurological: She is alert and oriented to person, place, and time. Coordination normal.  Skin: Skin is warm and dry. No rash noted. No erythema.  Psychiatric: She has a normal mood and affect. Her behavior is normal. Judgment and thought content normal.         Assessment and Plan

## 2011-05-30 NOTE — Assessment & Plan Note (Signed)
Edema on the left greater than the right. Likely secondary to venous insufficiency. We have recommended TED hose if the edema gets worse.

## 2011-05-30 NOTE — Patient Instructions (Signed)
You are doing well. Please start hydralazine 25 mg twice a day. Hold the hydralazine for blood pressure less than 125. Please call us if you have new issues that need to be addressed before your next appt.  We will call you for a follow up Appt. In 6 months

## 2011-06-30 ENCOUNTER — Telehealth: Payer: Self-pay

## 2011-06-30 DIAGNOSIS — I10 Essential (primary) hypertension: Secondary | ICD-10-CM

## 2011-06-30 MED ORDER — HYDRALAZINE HCL 25 MG PO TABS: 25.0000 mg | ORAL_TABLET | Freq: Two times a day (BID) | ORAL | Status: DC

## 2011-06-30 NOTE — Telephone Encounter (Signed)
Refill sent on hydralazine hcl 25 mg take one tablet twice a day.

## 2011-08-05 ENCOUNTER — Other Ambulatory Visit: Payer: Self-pay | Admitting: *Deleted

## 2011-08-05 MED ORDER — LOSARTAN POTASSIUM 50 MG PO TABS: 50.0000 mg | ORAL_TABLET | Freq: Every day | ORAL | Status: DC

## 2011-08-18 ENCOUNTER — Ambulatory Visit: Payer: Self-pay | Admitting: Internal Medicine

## 2011-08-29 ENCOUNTER — Other Ambulatory Visit: Payer: Self-pay | Admitting: *Deleted

## 2011-08-29 MED ORDER — NEBIVOLOL HCL 2.5 MG PO TABS: 2.5000 mg | ORAL_TABLET | Freq: Two times a day (BID) | ORAL | Status: DC

## 2011-09-05 ENCOUNTER — Telehealth: Payer: Self-pay

## 2011-09-05 MED ORDER — LOSARTAN POTASSIUM 50 MG PO TABS: 50.0000 mg | ORAL_TABLET | Freq: Every day | ORAL | Status: DC

## 2011-09-05 NOTE — Telephone Encounter (Signed)
Refill sent for losartan potassium 50 mg take one tablet daily. 

## 2011-09-23 ENCOUNTER — Telehealth: Payer: Self-pay | Admitting: *Deleted

## 2011-09-23 NOTE — Telephone Encounter (Signed)
Error

## 2011-10-03 ENCOUNTER — Telehealth: Payer: Self-pay

## 2011-10-03 NOTE — Telephone Encounter (Signed)
The patient called and stated renal physician states patient can now increase the losartan to 100 mg one tablet daily now.  Per Dr. Mariah Milling okay to send in new Rx for losartan 100 mg one tablet daily.

## 2011-10-05 ENCOUNTER — Telehealth: Payer: Self-pay

## 2011-10-05 MED ORDER — NEBIVOLOL HCL 2.5 MG PO TABS: 2.5000 mg | ORAL_TABLET | Freq: Two times a day (BID) | ORAL | Status: DC

## 2011-10-05 NOTE — Telephone Encounter (Signed)
Refill sent for bystolic 2.5 mg take one tablet bid.

## 2011-10-07 ENCOUNTER — Telehealth: Payer: Self-pay | Admitting: *Deleted

## 2011-10-07 NOTE — Telephone Encounter (Signed)
Pt's husband called stating that pt's BP for past 2 weeks has been very elevated, normally in 160s-170 systolic. After she takes AM BP meds it will go only as low as 140, never below. Medlist is up to date, I told pt per last ov note that pt can take hydralazine 25mg  PRN along with BID. So atleast 2 more doses per day for SBP >140. Advised pt try this over weekend and call me back Monday with results. If still high, then will see if Dr. Mariah Milling wants to incr dose of any BP meds prior to her f/u on 11/23. Pt and husband ok with this.

## 2011-10-10 ENCOUNTER — Telehealth: Payer: Self-pay | Admitting: *Deleted

## 2011-10-10 NOTE — Telephone Encounter (Signed)
Please see phone note 11/9. Pt's husband Leonette Most) called back this AM stating after adding extra hydralazine 50mg  for SBP >140, her BP has not changed much. Please review BP meds and advise. She is taking up to max of hydralazine 50mg  QID (regular BID) takes additional for SBP >140. Husband says Saturday SBP 160, Sun <150, today 150, took hydralazine at 11am, 1 hr later SBP still 148. He wants to know what pt needs to do.

## 2011-10-10 NOTE — Telephone Encounter (Signed)
We could try clonidine 0.1 mg BID She could start a 1/2 tab BID This is not as needed. It would have to be on a regular basis. If BP runs low, would decrease hydralazine to lower dose (after after BP runs lower)

## 2011-10-11 MED ORDER — CLONIDINE HCL 0.1 MG PO TABS: 0.1000 mg | ORAL_TABLET | Freq: Two times a day (BID) | ORAL | Status: DC

## 2011-10-11 NOTE — Telephone Encounter (Signed)
Spoke to [place], notified of msg below. Pt will start Clonidine 0.1 BID (1/2 BID for first week), she will continue Hydralazine 25mg  BID and also PRN for continued BP >140 (no more than 4 times/day). Pt will monitor BP in meantime and will call if too low. Pt will bring BP numbers to her ov 11/23.

## 2011-10-21 ENCOUNTER — Encounter: Payer: Self-pay | Admitting: Cardiovascular Disease

## 2011-10-21 ENCOUNTER — Ambulatory Visit (INDEPENDENT_AMBULATORY_CARE_PROVIDER_SITE_OTHER): Payer: Medicare Other | Admitting: Cardiovascular Disease

## 2011-10-21 DIAGNOSIS — I158 Other secondary hypertension: Secondary | ICD-10-CM

## 2011-10-21 DIAGNOSIS — I10 Essential (primary) hypertension: Secondary | ICD-10-CM

## 2011-10-21 DIAGNOSIS — I5032 Chronic diastolic (congestive) heart failure: Secondary | ICD-10-CM

## 2011-10-21 DIAGNOSIS — E785 Hyperlipidemia, unspecified: Secondary | ICD-10-CM

## 2011-10-21 DIAGNOSIS — R609 Edema, unspecified: Secondary | ICD-10-CM

## 2011-10-21 DIAGNOSIS — I509 Heart failure, unspecified: Secondary | ICD-10-CM

## 2011-10-21 NOTE — Assessment & Plan Note (Signed)
Blood pressures have  been drifting higher. They would like to move away from hydralazine which has frequent dosing. We will try clonidine 0.1 b.i.d. Cut in 1/2 to start with slow titration upwards to a full pill as needed. The hope would be to titrate up the clonidine and wean off the hydralazine for ease of use. If she has side effects on the clonidine, we could increase the hydralazine to 50 mg q.i.d..

## 2011-10-21 NOTE — Patient Instructions (Signed)
You are doing well. Please try the clonidine 1/2 pil twice a day Monitor blood pressure. If blood pressure is still high after one week, increase the clonidine to a full pill.  If the blood pressure is well controlled on a 1/2 clonidine, then stop the hydralazine, monitor blood pressure If the blood pressure increases with hydralazine then go to a full clonidine pill  Please call us if you have new issues that need to be addressed before your next appt.  The office will contact you for a follow up Appt. In 6 months

## 2011-10-21 NOTE — Assessment & Plan Note (Signed)
Edema of the left lower extremity is likely secondary to venous insufficiency. We have suggested she be cautious with the diuretic intake is possibly no more than every other day.

## 2011-10-21 NOTE — Assessment & Plan Note (Signed)
We have suggested she continue on her simvastatin. Do not have a recent lipid panel for our review.

## 2011-10-21 NOTE — Progress Notes (Signed)
[name], female    DOB: 12/07/1926, 77 y.o.   MRN: [id number]  HPI Comments: 77 yo WF with history of HTN and diastolic CHF presents for followup.  She has severe scoliosis and chronic back pain and requires pain medication.   She reports that she was doing very well but continues to be in chronic pain from her back. She was tried on pain medications prescribed by the pain clinic who started having hallucinations and now she takes Tylenol q.i.d. With other pain rubs. Her blood pressure has been increasing recently and she is concerned with systolic pressures in the 160 range. Uncertain if this is secondary to pain though as her pain is chronic and her blood pressure was well-controlled previously, she and her husband believe they need medication titration.  She continues to take Lasix at least 5 times per week for edema of the left lower extremity. No significant edema of the right lower extremity. Uncertain if the Lasix helps with the edema apart from to a mild extent.  catheterization in 2003 showing luminal irregularities,  negative stress test in October 2010 with no ischemia, echocardiogram August 2010 showing normal systolic function, mild to moderate tricuspid regurgitation, mildly elevated right ventricular systolic pressures consistent with mild pulmonary hypertension, ejection fraction 60%.  Blood work from July 2012 shows creatinine 1.67, BUN 32  EKG shows normal sinus rhythm with rate 67 beats per minute,  no significant ST or T wave changes. LVH by voltage criteria      Outpatient Encounter Prescriptions as of 10/21/2011  Medication Sig Dispense Refill  . aspirin 81 MG EC tablet Take 81 mg by mouth daily.        . B Complex Vitamins (PA B-COMPLEX WITH B-12) TABS Take 1 tablet by mouth daily.        . Cholecalciferol (VITAMIN D) 400 UNITS capsule Take 400 Units by mouth daily.        . dorzolamide (TRUSOPT) 2 % ophthalmic solution Place 1 drop into both  eyes 2 (two) times daily in the am and at bedtime..        . furosemide (LASIX) 20 MG tablet Take 40 mg by mouth daily. 4 to 5 times a week      . Glucosamine-Chondroitin 1500-1200 MG/30ML LIQD Take 1 tablet by mouth daily.        . hydrALAZINE (APRESOLINE) 25 MG tablet Take 25 mg by mouth 4 (four) times daily.        Marland Kitchen losartan (COZAAR) 100 MG tablet Take 1 tablet (100 mg total) by mouth daily.      . magnesium gluconate (MAGONATE) 500 MG tablet Take 500 mg by mouth daily.        . multivitamin (THERAGRAN) per tablet Take 1 tablet by mouth daily.        . nebivolol (BYSTOLIC) 2.5 MG tablet Take 1 tablet (2.5 mg total) by mouth 2 (two) times daily.  60 tablet  0  . omeprazole (PRILOSEC OTC) 20 MG tablet Take 20 mg by mouth daily.        . Polyethylene Glycol 3350 (MIRALAX PO) Take by mouth at bedtime.        . simvastatin (ZOCOR) 10 MG tablet Take 10 mg by mouth at bedtime.        . travoprost, benzalkonium, (TRAVATAN) 0.004 % ophthalmic solution Place 1 drop into both eyes at bedtime.        . vitamin C (ASCORBIC ACID) 500 MG  tablet Take 500 mg by mouth daily.           Review of Systems  Constitutional: Negative.   HENT: Negative.   Eyes: Negative.   Respiratory: Negative.   Cardiovascular: Negative.   Gastrointestinal: Negative.   Musculoskeletal: Positive for back pain.  Skin: Negative.   Neurological: Negative.   Hematological: Negative.   Psychiatric/Behavioral: Negative.   All other systems reviewed and are negative.    BP 138/82  Ht 5\' 5"  (1.651 m)  Wt 146 lb 1.9 oz (66.28 kg)  BMI 24.32 kg/m2  Physical Exam  Nursing note and vitals reviewed. Constitutional: She is oriented to person, place, and time. She appears well-developed and well-nourished.  HENT:  Head: Normocephalic.  Nose: Nose normal.  Mouth/Throat: Oropharynx is clear and moist.  Eyes: Conjunctivae are normal. Pupils are equal, round, and reactive to light.  Neck: Normal range of motion. Neck supple.  No JVD present.  Cardiovascular: Normal rate, regular rhythm, S1 normal, S2 normal, normal heart sounds and intact distal pulses.  Exam reveals no gallop and no friction rub.   No murmur heard.      Nonpitting edema, worse on the left below the knee  Pulmonary/Chest: Effort normal and breath sounds normal. No respiratory distress. She has no wheezes. She has no rales. She exhibits no tenderness.  Abdominal: Soft. Bowel sounds are normal. She exhibits no distension. There is no tenderness.  Musculoskeletal: Normal range of motion. She exhibits no edema and no tenderness.       Severe scoliosis and kyphosis  Lymphadenopathy:    She has no cervical adenopathy.  Neurological: She is alert and oriented to person, place, and time. Coordination normal.  Skin: Skin is warm and dry. No rash noted. No erythema.  Psychiatric: She has a normal mood and affect. Her behavior is normal. Judgment and thought content normal.         Assessment and Plan

## 2011-10-21 NOTE — Assessment & Plan Note (Signed)
Lungs are clear, clinically he does not appear to have fluid overload. We have suggested she use Lasix no more than every other day. Creatinine earlier this year showed elevation with a climb in her BUN concerning for hypovolemia.

## 2011-10-24 ENCOUNTER — Telehealth: Payer: Self-pay

## 2011-10-24 MED ORDER — HYDRALAZINE HCL 25 MG PO TABS: 25.0000 mg | ORAL_TABLET | Freq: Four times a day (QID) | ORAL | Status: DC

## 2011-10-24 NOTE — Telephone Encounter (Signed)
New Rx sent for hydralazine HCL 25 mg take one tablet four times a day.

## 2011-10-24 NOTE — Telephone Encounter (Signed)
Notified Pharmacist Molly Maduro) of new Rx.

## 2011-11-04 ENCOUNTER — Telehealth: Payer: Self-pay | Admitting: *Deleted

## 2011-11-04 NOTE — Telephone Encounter (Signed)
Pt's son dropped off pt's BP readings. She recently saw Dr. Thedore Mins and had wt gain of 5 lbs due to fluid in both legs,he had her resume lasix (need to clarify w/ son how often--this is per BP note); per Thedore Mins her creat much improved, will request these labs. She is now taking Clonidine 0.1 full tablet BID, still needing hydralazine 25 QID (per last note plan was to back off hydralazine once she was taking full dose of clonidine.) She also takes bystolic 2.5 bid and losartan 100mg  qd.  Very detailed list, in summary: BP every AM ranging 140-156/75-85; PM running better at 120s-130s/60s; some high during day 150s-160s SBP 2-5pm. HR ranging low 60s-75 usually. Do you want her to incr bystolic to 5 so not having to cut in half, and possibly incr clonidine so she can come off of hydralazine? Please advise.

## 2011-11-07 ENCOUNTER — Telehealth: Payer: Self-pay

## 2011-11-07 NOTE — Telephone Encounter (Signed)
Lets do one change at a time. Lets increase clonidine to 0.2 mg BID and hold hydralazine If BP runs high, increase bystolic to 5 mg daily

## 2011-11-07 NOTE — Telephone Encounter (Signed)
Notified patient per Dr. Mariah Milling need to take clonidine 0.2 mg bid, hold the hydralazine. If blood pressure runs high then she will increase the bystolic to 5 mg daily.

## 2011-11-07 NOTE — Telephone Encounter (Signed)
Patient will take clonidine 0.2 mg bid and if blood pressure high she will take bystolic 5 mg daily.

## 2011-11-08 ENCOUNTER — Telehealth: Payer: Self-pay

## 2011-11-08 ENCOUNTER — Other Ambulatory Visit: Payer: Self-pay | Admitting: Cardiology

## 2011-11-08 MED ORDER — CLONIDINE HCL 0.2 MG PO TABS: 0.2000 mg | ORAL_TABLET | Freq: Two times a day (BID) | ORAL | Status: DC

## 2011-11-08 NOTE — Telephone Encounter (Signed)
New Rx sent for clonidine 0.2 mg take one tablet bid.

## 2011-11-17 ENCOUNTER — Telehealth: Payer: Self-pay | Admitting: *Deleted

## 2011-11-17 NOTE — Telephone Encounter (Signed)
Pt's husband dropped off BP numbers earlier this week after pt incr clonidine to 0.2mg  daily. Long list, BPs still high in AM 150-160 SBP, throughout daytime BP varies sometimes 118-120s, other times back up at 150-160s. Pt currently taking Bystolic 2.5 BID, per Dr. Mariah Milling, pt advised to change this to 5mg  in evening to help with high AM pressures. Husband will have pt do this and will send BP recordings in 1 week.

## 2011-12-02 ENCOUNTER — Telehealth: Payer: Self-pay

## 2011-12-02 MED ORDER — NEBIVOLOL HCL 5 MG PO TABS: ORAL_TABLET | ORAL | Status: DC

## 2011-12-02 NOTE — Telephone Encounter (Signed)
Refill sent for bystolic 

## 2012-02-02 ENCOUNTER — Telehealth: Payer: Self-pay | Admitting: Cardiovascular Disease

## 2012-02-02 NOTE — Telephone Encounter (Signed)
Pt husband calling states that her BP is running high @ 9:00- 138/69, 172/87,  Feet cold, numbness in arms, no chest pain, stomach ache this am

## 2012-02-02 NOTE — Telephone Encounter (Signed)
Talked to pt's spouse.  He says that the patient is cold and her feet have been cold since Dr. Mariah Milling changed her blood pressure meds.  In addition he states that her blood pressure is high today at 138/69.  Advised pt to call PCP and schedule appt.

## 2012-02-08 ENCOUNTER — Other Ambulatory Visit: Payer: Self-pay | Admitting: Cardiovascular Disease

## 2012-03-27 ENCOUNTER — Other Ambulatory Visit: Payer: Self-pay | Admitting: Cardiovascular Disease

## 2012-03-27 NOTE — Telephone Encounter (Signed)
Refilled Losartan

## 2012-04-19 ENCOUNTER — Encounter: Payer: Self-pay | Admitting: Cardiovascular Disease

## 2012-04-19 ENCOUNTER — Ambulatory Visit (INDEPENDENT_AMBULATORY_CARE_PROVIDER_SITE_OTHER): Payer: Medicare Other | Admitting: Cardiovascular Disease

## 2012-04-19 VITALS — BP 130/70 | HR 61 | Ht 64.0 in | Wt 150.5 lb

## 2012-04-19 DIAGNOSIS — R609 Edema, unspecified: Secondary | ICD-10-CM

## 2012-04-19 DIAGNOSIS — E785 Hyperlipidemia, unspecified: Secondary | ICD-10-CM

## 2012-04-19 DIAGNOSIS — I5032 Chronic diastolic (congestive) heart failure: Secondary | ICD-10-CM

## 2012-04-19 DIAGNOSIS — I509 Heart failure, unspecified: Secondary | ICD-10-CM

## 2012-04-19 DIAGNOSIS — I10 Essential (primary) hypertension: Secondary | ICD-10-CM

## 2012-04-19 NOTE — Patient Instructions (Signed)
You are doing well. No medication changes were made.  Please call us if you have new issues that need to be addressed before your next appt.  Your physician wants you to follow-up in: 6 months.  You will receive a reminder letter in the mail two months in advance. If you don't receive a letter, please call our office to schedule the follow-up appointment.   

## 2012-04-19 NOTE — Assessment & Plan Note (Signed)
We have suggested she stay on her statin. 

## 2012-04-19 NOTE — Assessment & Plan Note (Signed)
Blood pressure has been relatively well controlled on her current medication regimen. No changes made.

## 2012-04-19 NOTE — Assessment & Plan Note (Signed)
No significant shortness of breath. She appears relatively euvolemic. We have encouraged her to stay on her Lasix, holding a dose as needed as she is currently doing.

## 2012-04-19 NOTE — Assessment & Plan Note (Signed)
Left greater than right lower from edema, likely secondary to venous insufficiency. Currently wearing compression hose

## 2012-04-19 NOTE — Progress Notes (Signed)
[name], female    DOB: 1927/11/09, 78 y.o.   MRN: [id number]  HPI Comments: 78 yo WF with history of HTN and diastolic CHF presents for followup.  She has severe scoliosis and chronic back pain and requires pain medication.   She reports that she was doing very well but continues to be in chronic pain from her back.  She has been dealing with an eye problem, macular degeneration. She is scheduled to have a shot to the eye in the near future. She continues to take Lasix 40 mg daily. She reports renal function has been relatively stable. She takes hydralazine twice a day, sometimes 3 times a day. Blood pressure has been relatively well controlled with rare systolic pressure of 150, mostly 120-140.  catheterization in 2003 showing luminal irregularities,  negative stress test in October 2010 with no ischemia, echocardiogram August 2010 showing normal systolic function, mild to moderate tricuspid regurgitation, mildly elevated right ventricular systolic pressures consistent with mild pulmonary hypertension, ejection fraction 60%.  EKG shows normal sinus rhythm with rate 61 beats per minute,  no significant ST or T wave changes. LVH by voltage criteria      Outpatient Encounter Prescriptions as of 04/19/2012  Medication Sig Dispense Refill  . aspirin 81 MG EC tablet Take 81 mg by mouth daily.        . B Complex Vitamins (PA B-COMPLEX WITH B-12) TABS Take 1 tablet by mouth daily.        . brimonidine (ALPHAGAN P) 0.1 % SOLN Place 1 drop into the left eye 2 (two) times daily.      . Cholecalciferol (VITAMIN D) 400 UNITS capsule Take 400 Units by mouth daily.        . dorzolamide (TRUSOPT) 2 % ophthalmic solution Place 1 drop into both eyes 2 (two) times daily in the am and at bedtime..        . furosemide (LASIX) 20 MG tablet Take 40 mg by mouth daily.       . Glucosamine-Chondroitin 1500-1200 MG/30ML LIQD Take 1 tablet by mouth daily.        . hydrALAZINE (APRESOLINE) 25  MG tablet Take 25 mg by mouth 4 (four) times daily as needed.      Marland Kitchen losartan (COZAAR) 100 MG tablet TAKE 1 TABLET DAILY  30 tablet  5  . magnesium gluconate (MAGONATE) 500 MG tablet Take 500 mg by mouth daily.        . multivitamin (THERAGRAN) per tablet Take 1 tablet by mouth daily.        . nebivolol (BYSTOLIC) 5 MG tablet Take 2.5 mg am & 5 mg in the pm       . omeprazole (PRILOSEC OTC) 20 MG tablet Take 20 mg by mouth 2 (two) times daily.       . Polyethylene Glycol 3350 (MIRALAX PO) Take by mouth at bedtime.        . simvastatin (ZOCOR) 10 MG tablet Take 10 mg by mouth at bedtime.        . travoprost, benzalkonium, (TRAVATAN) 0.004 % ophthalmic solution Place 1 drop into both eyes at bedtime.        . vitamin C (ASCORBIC ACID) 500 MG tablet Take 500 mg by mouth daily.        Marland Kitchen DISCONTD: cloNIDine (CATAPRES) 0.2 MG tablet TAKE 1 TABLET  BY MOUTH 2 TIMES DAILY.  60 tablet  2   Review of Systems  Constitutional: Negative.  HENT: Negative.   Eyes: Negative.   Respiratory: Negative.   Cardiovascular: Negative.   Gastrointestinal: Negative.   Musculoskeletal: Positive for back pain.  Skin: Negative.   Neurological: Negative.   Hematological: Negative.   Psychiatric/Behavioral: Negative.   All other systems reviewed and are negative.    BP 130/70  Pulse 61  Ht 5\' 4"  (1.626 m)  Wt 150 lb 8 oz (68.266 kg)  BMI 25.83 kg/m2  Physical Exam  Nursing note and vitals reviewed. Constitutional: She is oriented to person, place, and time. She appears well-developed and well-nourished.  HENT:  Head: Normocephalic.  Nose: Nose normal.  Mouth/Throat: Oropharynx is clear and moist.  Eyes: Conjunctivae are normal. Pupils are equal, round, and reactive to light.  Neck: Normal range of motion. Neck supple. No JVD present.  Cardiovascular: Normal rate, regular rhythm, S1 normal, S2 normal, normal heart sounds and intact distal pulses.  Exam reveals no gallop and no friction rub.   No murmur  heard.      Nonpitting edema, worse on the left below the knee  Pulmonary/Chest: Effort normal and breath sounds normal. No respiratory distress. She has no wheezes. She has no rales. She exhibits no tenderness.  Abdominal: Soft. Bowel sounds are normal. She exhibits no distension. There is no tenderness.  Musculoskeletal: Normal range of motion. She exhibits no edema and no tenderness.       Severe scoliosis and kyphosis  Lymphadenopathy:    She has no cervical adenopathy.  Neurological: She is alert and oriented to person, place, and time. Coordination normal.  Skin: Skin is warm and dry. No rash noted. No erythema.  Psychiatric: She has a normal mood and affect. Her behavior is normal. Judgment and thought content normal.         Assessment and Plan

## 2012-05-29 ENCOUNTER — Other Ambulatory Visit: Payer: Self-pay | Admitting: *Deleted

## 2012-05-29 MED ORDER — NEBIVOLOL HCL 5 MG PO TABS: ORAL_TABLET | ORAL | Status: DC

## 2012-05-29 NOTE — Telephone Encounter (Signed)
Refilled Bystolic

## 2012-06-05 ENCOUNTER — Telehealth: Payer: Self-pay | Admitting: Cardiovascular Disease

## 2012-06-05 NOTE — Telephone Encounter (Signed)
Pain started in her back and has moved to the front around her heart left side. BP is 169/86 HR 66 159/82 Hr 59. No vomiting, sweating or SOB.

## 2012-06-05 NOTE — Telephone Encounter (Signed)
Pt says she awoke this morning with back pain in upper back that radiated to left shoulder.  BP=147/83, 159/82 She took her meds this am as usual Pain has now radiated around to the front left chest Denies sob She describes discomfort as a "catch" in her chest.  She feels if she turns a certain way, it can change the pain. I had her hold while I discussed with Dr. Mariah Milling  Discussed with Dr. Mariah Milling.  He feels, in the presence of a negative cath in 2003 and negative stress test in 2010, the pain she is having is most likely r/t her chronic back pain/musculoskeletal causes.  He asks that we offer to draw stat cardiac enzymes if pt is concerned.    I explained this to pt and gave her reassurance that her pain is most likely r/t back/musculoskeletal issues versus cardiac cause.  I offered to have her come in for stat cardiac enzymes to give her some reassurance.  She denies labs at this time and is willing to continue to monitor the pain.  If the symptoms get worse/change in any way she will call us back ASAP.  She asks if we should increase her BP meds.  I advised against this at this time, since the readings she is getting are while she is in more pain than usual. I advised her to continue to monitor BP BID and keep a log of these.  She will call us with results in a few days.

## 2012-06-26 ENCOUNTER — Other Ambulatory Visit: Payer: Self-pay | Admitting: Cardiovascular Disease

## 2012-07-05 ENCOUNTER — Telehealth: Payer: Self-pay

## 2012-07-05 NOTE — Telephone Encounter (Signed)
Not bad, Some numbers up a little She could either continue to watch numbers, Or increase bystolic to 5 mg BID Or increase hydralazine to 50 mg as needed, continue 25 mg otherwise

## 2012-07-05 NOTE — Telephone Encounter (Signed)
Pt dropped off BP log for Dr. Mariah Milling to review: 145/70 157/75 150/72 130/64 128/66 152/80 112/52 156/80 152/79 160/78 124/66

## 2012-07-06 ENCOUNTER — Other Ambulatory Visit: Payer: Self-pay

## 2012-07-06 MED ORDER — NEBIVOLOL HCL 5 MG PO TABS: 5.0000 mg | ORAL_TABLET | Freq: Two times a day (BID) | ORAL | Status: DC

## 2012-07-06 NOTE — Telephone Encounter (Signed)
Pt informed. She wishes to try increasing Bystolic to 5 mg BID. She will continue to keep log of BPs and will drop this off next week after increasing Bystolic.

## 2012-07-19 ENCOUNTER — Other Ambulatory Visit: Payer: Self-pay | Admitting: Cardiovascular Disease

## 2012-07-19 MED ORDER — NEBIVOLOL HCL 5 MG PO TABS: 5.0000 mg | ORAL_TABLET | Freq: Two times a day (BID) | ORAL | Status: DC

## 2012-07-19 NOTE — Telephone Encounter (Signed)
Refilled Bystolic

## 2012-07-19 NOTE — Telephone Encounter (Signed)
Pt takes 5mg  (2) TIMES DAILY

## 2012-09-24 ENCOUNTER — Other Ambulatory Visit: Payer: Self-pay | Admitting: Cardiovascular Disease

## 2012-09-25 ENCOUNTER — Other Ambulatory Visit: Payer: Self-pay | Admitting: *Deleted

## 2012-09-25 MED ORDER — LOSARTAN POTASSIUM 100 MG PO TABS: 100.0000 mg | ORAL_TABLET | Freq: Every day | ORAL | Status: DC

## 2012-09-25 NOTE — Telephone Encounter (Signed)
Refilled Losartan

## 2012-10-16 ENCOUNTER — Ambulatory Visit (INDEPENDENT_AMBULATORY_CARE_PROVIDER_SITE_OTHER): Payer: Medicare Other | Admitting: Cardiovascular Disease

## 2012-10-16 ENCOUNTER — Encounter: Payer: Self-pay | Admitting: Cardiovascular Disease

## 2012-10-16 VITALS — BP 158/72 | HR 63 | Ht 64.0 in | Wt 156.0 lb

## 2012-10-16 DIAGNOSIS — I10 Essential (primary) hypertension: Secondary | ICD-10-CM

## 2012-10-16 DIAGNOSIS — R609 Edema, unspecified: Secondary | ICD-10-CM

## 2012-10-16 MED ORDER — HYDRALAZINE HCL 50 MG PO TABS: 50.0000 mg | ORAL_TABLET | Freq: Four times a day (QID) | ORAL | Status: DC

## 2012-10-16 MED ORDER — FUROSEMIDE 40 MG PO TABS: 40.0000 mg | ORAL_TABLET | Freq: Every day | ORAL | Status: DC

## 2012-10-16 NOTE — Assessment & Plan Note (Signed)
For her blood pressure which continues to run high, we have suggested she slowly increase her hydralazine up to 50 mg 4 times a day if needed. She continues to have systolic pressure in the 140, 150 range, rarely if ever less than 120. 

## 2012-10-16 NOTE — Patient Instructions (Addendum)
You are doing well. Please increase the hydralazine up to 50 mg four times a day for high blood pressure  Talk with Dr. Lorin Picket about lidoderm patch  Please call us if you have new issues that need to be addressed before your next appt.  Your physician wants you to follow-up in: 6 months.  You will receive a reminder letter in the mail two months in advance. If you don't receive a letter, please call our office to schedule the follow-up appointment.

## 2012-10-16 NOTE — Assessment & Plan Note (Deleted)
For her blood pressure which continues to run high, we have suggested she slowly increase her hydralazine up to 50 mg 4 times a day if needed. She continues to have systolic pressure in the 140, 811 range, rarely if ever less than 120.

## 2012-10-16 NOTE — Progress Notes (Signed)
[name], female    DOB: 1927/10/11, 78 y.o.   MRN: [id number]  HPI Comments: 78 yo WF with history of HTN and diastolic CHF, chronic lower extremity edema, presents for followup.  She has severe scoliosis and chronic back pain and requires pain medication.   She reports that she was doing very well but continues to be in chronic pain from her back, Also dealing with an eye problem, macular degeneration. She had evaluation for her back at [facility]. No surgery or shots was recommended. She continues to take Lasix 40 mg daily, presumably for lower extremity edema, history of diastolic CHF. Creatinine and BUN have been elevated. She is followed by Dr. Thedore Mins. Previous creatinine 1.8, BUN 41 , which is above her baseline She takes hydralazine 3 times a day, Blood pressure has been elevated with systolic pressure typically 140-150, occasional 130.    catheterization in 2003 showing luminal irregularities,  negative stress test in October 2010 with no ischemia, echocardiogram August 2010 showing normal systolic function, mild to moderate tricuspid regurgitation, mildly elevated right ventricular systolic pressures consistent with mild pulmonary hypertension, ejection fraction 60%.  EKG shows normal sinus rhythm with rate   63 beats per minute,  no significant ST or T wave changes. LVH by voltage criteria      Outpatient Encounter Prescriptions as of 10/16/2012  Medication Sig Dispense Refill  . aspirin 81 MG EC tablet Take 81 mg by mouth daily.        . B Complex Vitamins (PA B-COMPLEX WITH B-12) TABS Take 1 tablet by mouth daily.        . brimonidine (ALPHAGAN P) 0.1 % SOLN Place 1 drop into the left eye 2 (two) times daily.      . Calcium Carbonate-Vitamin D (CALCIUM-VITAMIN D) 600-200 MG-UNIT CAPS Take by mouth. Takes 1/2 tablet daily.      . dorzolamide (TRUSOPT) 2 % ophthalmic solution Place 1 drop into both eyes 2 (two) times daily in the am and at bedtime..        .  furosemide (LASIX) 40 MG tablet Take 1 tablet (40 mg total) by mouth daily.  30 tablet  11  . Glucosamine-Chondroitin 1500-1200 MG/30ML LIQD Take by mouth. Takes 1/2 tablet daily.      . hydrALAZINE (APRESOLINE) 50 MG tablet Take 1 tablet (50 mg total) by mouth 4 (four) times daily.  120 tablet  11  . losartan (COZAAR) 100 MG tablet Take 1 tablet (100 mg total) by mouth daily.  30 tablet  5  . magnesium gluconate (MAGONATE) 500 MG tablet Take 500 mg by mouth daily.        . multivitamin (THERAGRAN) per tablet Take 1 tablet by mouth daily.        . nebivolol (BYSTOLIC) 5 MG tablet Take 1 tablet (5 mg total) by mouth 2 (two) times daily.  45 tablet  5  . NON FORMULARY Lantanoprost eye drops takes one drop both eyes daily.      Marland Kitchen omeprazole (PRILOSEC OTC) 20 MG tablet Take 20 mg by mouth 2 (two) times daily.       . Polyethylene Glycol 3350 (MIRALAX PO) Take by mouth at bedtime.        . simvastatin (ZOCOR) 10 MG tablet Take 10 mg by mouth at bedtime.        . vitamin C (ASCORBIC ACID) 500 MG tablet Take 500 mg by mouth daily.  Review of Systems  Constitutional: Negative.   HENT: Negative.   Eyes: Negative.   Respiratory: Negative.   Cardiovascular: Positive for leg swelling.  Gastrointestinal: Negative.   Musculoskeletal: Positive for back pain.  Skin: Negative.   Neurological: Negative.   Hematological: Negative.   Psychiatric/Behavioral: Negative.   All other systems reviewed and are negative.    BP 158/72  Pulse 63  Ht 5\' 4"  (1.626 m)  Wt 156 lb (70.761 kg)  BMI 26.78 kg/m2  Physical Exam  Nursing note and vitals reviewed. Constitutional: She is oriented to person, place, and time. She appears well-developed and well-nourished.  HENT:  Head: Normocephalic.  Nose: Nose normal.  Mouth/Throat: Oropharynx is clear and moist.  Eyes: Conjunctivae normal are normal. Pupils are equal, round, and reactive to light.  Neck: Normal range of motion. Neck supple. No JVD  present.  Cardiovascular: Normal rate, regular rhythm, S1 normal, S2 normal, normal heart sounds and intact distal pulses.  Exam reveals no gallop and no friction rub.   No murmur heard.      Nonpitting edema, worse on the left below the knee  Pulmonary/Chest: Effort normal and breath sounds normal. No respiratory distress. She has no wheezes. She has no rales. She exhibits no tenderness.  Abdominal: Soft. Bowel sounds are normal. She exhibits no distension. There is no tenderness.  Musculoskeletal: Normal range of motion. She exhibits no edema and no tenderness.       Severe scoliosis and kyphosis  Lymphadenopathy:    She has no cervical adenopathy.  Neurological: She is alert and oriented to person, place, and time. Coordination normal.  Skin: Skin is warm and dry. No rash noted. No erythema.  Psychiatric: She has a normal mood and affect. Her behavior is normal. Judgment and thought content normal.         Assessment and Plan

## 2012-10-16 NOTE — Assessment & Plan Note (Addendum)
Suspect edema is secondary to venous insufficiency or lymphedema, less likely active diastolic heart failure. With Lasix 40 mg daily, creatinine and BUN have been climbing. We did suggest decreasing the Lasix to 20 mg daily. Her husband believes elevated BUN and creatinine is because she did not drink anything from 8:00 the night before. I suspect elevated renal function and low GFR is from underlying kidney disease from chronic hypertension, unable to exclude mild dehydration.

## 2012-10-17 ENCOUNTER — Telehealth: Payer: Self-pay | Admitting: *Deleted

## 2012-10-17 NOTE — Telephone Encounter (Signed)
Date:  BP:  HR: 10/01/12 145/73  65 10/01/12 128/69  70 10/01/12 161/75  63 10/02/12 159/73  64 10/02/12 139/73  74 10/03/12 135/71  63 10/03/12 159/79  67 10/04/12 116/54  67 10/04/12 162/79  66 10/05/12 161/77  66 10/05/12 141/73  61 10/06/12 140/71  63 10/06/12 166/78  63  10/07/12 125/60  66 10/07/12 156/77  60 10/08/12 127/65  70 10/08/12 167/80  68 10/10/12 147/72  66 10/10/12 171/86  68 10/12/12 156/84  70 10/12/12 159/80  71

## 2012-10-18 ENCOUNTER — Ambulatory Visit (INDEPENDENT_AMBULATORY_CARE_PROVIDER_SITE_OTHER): Payer: Medicare Other | Admitting: Internal Medicine

## 2012-10-18 ENCOUNTER — Encounter: Payer: Self-pay | Admitting: Internal Medicine

## 2012-10-18 VITALS — BP 153/87 | HR 59 | Temp 97.8°F | Ht 65.0 in | Wt 156.0 lb

## 2012-10-18 DIAGNOSIS — C541 Malignant neoplasm of endometrium: Secondary | ICD-10-CM

## 2012-10-18 DIAGNOSIS — Z139 Encounter for screening, unspecified: Secondary | ICD-10-CM

## 2012-10-18 DIAGNOSIS — I10 Essential (primary) hypertension: Secondary | ICD-10-CM

## 2012-10-18 DIAGNOSIS — M549 Dorsalgia, unspecified: Secondary | ICD-10-CM | POA: Insufficient documentation

## 2012-10-18 DIAGNOSIS — E785 Hyperlipidemia, unspecified: Secondary | ICD-10-CM

## 2012-10-18 DIAGNOSIS — N183 Chronic kidney disease, stage 3 unspecified: Secondary | ICD-10-CM

## 2012-10-18 DIAGNOSIS — E7800 Pure hypercholesterolemia, unspecified: Secondary | ICD-10-CM

## 2012-10-18 DIAGNOSIS — M810 Age-related osteoporosis without current pathological fracture: Secondary | ICD-10-CM | POA: Insufficient documentation

## 2012-10-18 DIAGNOSIS — E875 Hyperkalemia: Secondary | ICD-10-CM

## 2012-10-18 DIAGNOSIS — G8929 Other chronic pain: Secondary | ICD-10-CM

## 2012-10-18 DIAGNOSIS — R079 Chest pain, unspecified: Secondary | ICD-10-CM

## 2012-10-18 DIAGNOSIS — C549 Malignant neoplasm of corpus uteri, unspecified: Secondary | ICD-10-CM

## 2012-10-18 DIAGNOSIS — Z8542 Personal history of malignant neoplasm of other parts of uterus: Secondary | ICD-10-CM | POA: Insufficient documentation

## 2012-10-18 MED ORDER — LIDOCAINE 5 % EX PTCH: 1.0000 | MEDICATED_PATCH | CUTANEOUS | Status: DC

## 2012-10-18 NOTE — Patient Instructions (Addendum)
It was nice seeing you today.  I am glad things are stable.  We will try the lidoderm patch - as we discussed.  Let me know if any problems.

## 2012-10-18 NOTE — Assessment & Plan Note (Signed)
Just saw Dr Mariah Milling 10/16/12.  Recommended increasing hydralazine to 50mg  qid.

## 2012-10-18 NOTE — Assessment & Plan Note (Signed)
Previous vitamin D level wnl.  She desire no further medication.  Unable to take bisphosphonates secondary to her chronic kidney disease.  Follow.

## 2012-10-18 NOTE — Assessment & Plan Note (Signed)
Sees Dr Channing Mutters.  Has scoliosis.  Not felt to be a surgical candidate.  Unable to take antiinflammatories secondary to her renal insufficiency and GI problems.  No able to tolerate narcotics.  Had hallucinations with tramadol.  Saw Dr Yves Dill.

## 2012-10-18 NOTE — Assessment & Plan Note (Signed)
Being followed by Dr Mariah Milling.  No chest pain now.  Stress test 09/16/09 revealed normal LV funciton with no evidence of ischemia.  Continue risk factor modification.

## 2012-10-18 NOTE — Assessment & Plan Note (Signed)
Most recent checks have been wnl.  She can maintain a normal range if she watches her diet.

## 2012-10-18 NOTE — Assessment & Plan Note (Signed)
Right kidney smaller than left.  Has mild proteinuria.  Being followed by [facility].  Cr varies between 1.4 and 1.8.

## 2012-10-18 NOTE — Assessment & Plan Note (Signed)
On simvastatin.  Low cholesterol diet.  Check lipid panel and liver function with next labs.

## 2012-10-18 NOTE — Assessment & Plan Note (Signed)
Continues follow up with dr Feliberto Gottron.  Last seen 02/22/12.

## 2012-10-21 ENCOUNTER — Encounter: Payer: Self-pay | Admitting: Internal Medicine

## 2012-10-21 NOTE — Assessment & Plan Note (Signed)
Saw Dr Mariah Milling yesterday.  He increased the hydralazine to 50mg  qid.  Follow pressures and metabolic panel.

## 2012-10-21 NOTE — Assessment & Plan Note (Signed)
Followed by Dr Singh.  Cr has been stable at 1.4-1.8.  Follow.    

## 2012-10-21 NOTE — Progress Notes (Signed)
Subjective:    [name], female    DOB: 05-08-1927, 78 y.o.   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  She states she is still having increased back pain.  This is her main complaint.  She saw Dr Mariah Milling yesterday and he suggested her trying pain patches.  She is unable to take narcotic pain meds, antiinflammatories and did not tolerate ultram.  Tylenol does not control her pain.  She has seen surgery and is not a surgical candidate.  Dr Mariah Milling increased her hydralazine yesterday.  She just started the increased dose last night.  Blood pressures have been averaging 140-150 systolic readings.  No chest pain or tightness.   Past Medical History  Diagnosis Date  . S/P cardiac catheterization 05/2002    Luminal irregularities only in the coronaries. EF 55%. There was some irregulatrity in the right renal artery suggestive of possible fibromuscular dysplasia. Myoview in 5/08 showed no ischemia or infarction. Lexiscan myoview at [facility] (10/10) showed EF 55%, normal wall motion, no evience ofor ischemia or infarction.  . Diastolic CHF, acute     Echo (4/09) with EF 60-65%, mild LVH, mild to moderate TR, PASP 40 mmHg.  Marland Kitchen HTN (hypertension)   . Dizziness     Thought to be side effect of Norvasc  . Hypercalcemia     On HCTZ  . Scoliosis associated with other condition     Chronic back pain  . GERD (gastroesophageal reflux disease)   . Uterine cancer     S/P hypsterectomy in 2008  . Hyperkalemia   . Macular degeneration   . Renal insufficiency   . Hypercholesterolemia   . Osteoporosis   . Renal fibromuscular dysplasia   . Glaucoma     Review of Systems Patient denies any headache, lightheadedness or dizziness.  No sinus or allergy symptoms.  No chest pain,  tightness or palpitations.  No increased shortness of breath, cough or congestion.  No nausea or vomiting.  No abdominal pain or cramping.  No bowel change, such as diarrhea, constipation, BRBPR or melana.  No urine change.        Objective:   Physical Exam Filed Vitals:   10/18/12 0832  BP: 153/87  Pulse: 59  Temp: 97.8 F (73.16 C)   78 year old female in no acute distress.   HEENT:  Nares - clear.  OP- without lesions or erythema.  NECK:  Supple, nontender.  No audiblebruit.   HEART:  Appears to be regular. LUNGS:  Without crackles or wheezing audible.  Respirations even and unlabored.   RADIAL PULSE:  Equal bilaterally.  BREASTS:  No nipple discharge.  Could not appreciate any distinct nodule or axillary adenopathy.   ABDOMEN:  Soft, nontender.  Audible abdominal bruit.   EXTREMITIES:  No increased edema to be present.  Stable.                    Assessment & Plan:  CARDIOVASCULAR.  Sees Dr Mariah Milling.  09/16/09 stress test revealed normal LV function with no evidence of ischemia.    GI.  Colonoscopy 06/04/10 revealed marked tortuosity.  90% of the colon was evaluated.  Did not identify the cecum. No polyps.  Did have diverticulosis.     ABDOMINAL BRUIT.  Previous aortic ultrasound negative  for aneurysm.  Follow.   HISTORY OF RENAL CYSTS.  Followed by nephrology.   HEALTH MAINTENANCE.  Physical today.  Gets her pelvics and paps through GYN (Dr Feliberto Gottron).  GI as above.  Schedule her mammogram.

## 2012-10-21 NOTE — Assessment & Plan Note (Signed)
Followed by Dr Feliberto Gottron.

## 2012-10-21 NOTE — Assessment & Plan Note (Signed)
Has known scoliosis.  Has seen surgery.  Not a surgical candidate.  Unable to take narcotic pain meds, antiinflammatories and is unable to tolerate tramadol.   Will try Lidoderm patches as directed.

## 2012-10-21 NOTE — Assessment & Plan Note (Signed)
Remains on simvastatin.  Low cholesterol diet.  Check lipid panel and liver function with next labs.   

## 2012-10-21 NOTE — Assessment & Plan Note (Signed)
Previous vitamin D level wnl.  Unable to take bisphosphonates.  Follow.   

## 2012-10-21 NOTE — Assessment & Plan Note (Signed)
If she watches her diet, potassium stays wnl.  Follow.    

## 2012-10-24 ENCOUNTER — Ambulatory Visit: Payer: Self-pay | Admitting: Internal Medicine

## 2012-10-29 ENCOUNTER — Telehealth: Payer: Self-pay

## 2012-10-29 NOTE — Telephone Encounter (Signed)
Pt dropped off BP readings: 136/73 153/73 140/70 130/63 152/79 143/69 149/70 161/77 157/79 127/58 158/79 124/57 159/86 162/83 173/84 159/77 126/59 152/69 166/81 133/64 132/70 157/77 160/73 125/67 175/80 HR=57-76

## 2012-10-30 NOTE — Telephone Encounter (Signed)
Blood pressures are up and down. In general, I think I would continue to monitor this. Hesitate to add medications as some pressure numbers are low.

## 2012-10-31 NOTE — Telephone Encounter (Signed)
Pt informed Understanding verb 

## 2012-11-01 ENCOUNTER — Other Ambulatory Visit (INDEPENDENT_AMBULATORY_CARE_PROVIDER_SITE_OTHER): Payer: 59

## 2012-11-01 DIAGNOSIS — E7800 Pure hypercholesterolemia, unspecified: Secondary | ICD-10-CM

## 2012-11-01 DIAGNOSIS — I10 Essential (primary) hypertension: Secondary | ICD-10-CM

## 2012-11-01 LAB — LIPID PANEL
HDL: 68.1 mg/dL (ref >39.00)
Total CHOL/HDL Ratio: 2
Triglycerides: 124 mg/dL (ref 0.0–149.0)
VLDL: 24.8 mg/dL (ref 0.0–40.0)

## 2012-11-01 LAB — BASIC METABOLIC PANEL
CO2: 25 mEq/L (ref 19–32)
Glucose, Bld: 118 mg/dL — ABNORMAL HIGH (ref 70–99)
Potassium: 4.6 mEq/L (ref 3.5–5.1)
Sodium: 137 mEq/L (ref 135–145)

## 2012-11-01 LAB — HEPATIC FUNCTION PANEL
AST: 24 U/L (ref 0–37)
Bilirubin, Direct: 0 mg/dL (ref 0.0–0.3)
Total Bilirubin: 0.6 mg/dL (ref 0.3–1.2)

## 2012-11-02 ENCOUNTER — Telehealth: Payer: Self-pay | Admitting: Internal Medicine

## 2012-11-02 NOTE — Telephone Encounter (Signed)
I have called for prior auth sheet to be faxed to [place] the number I called is [phone].  I spoke with Jill Castro at [facility],  Reasons why Lidocaine is needed per Dr. Lorin Picket  1. She is unable to take narcotic pain meds doesn't tolerate 2. Has chronic kidney diseases unable to take antiinflammatory meds. 3. Does not tolerate tramedol 4. Tylenol does not control Case ID is [id number]

## 2012-11-02 NOTE — Telephone Encounter (Signed)
Refill request for lidocaine 5% pat Qty:30 Sig: apply 1 patch daily on 12 hours off 12 hours.

## 2012-11-12 ENCOUNTER — Encounter: Payer: Self-pay | Admitting: Internal Medicine

## 2012-11-13 ENCOUNTER — Telehealth: Payer: Self-pay

## 2012-11-13 NOTE — Telephone Encounter (Signed)
Pt dropped off more BP readings.  These range from 117/64-168/65 HR=57-79 Detailed BP log on your desk

## 2012-11-14 NOTE — Telephone Encounter (Signed)
What am I supposed to do with this. If you can show I will do it.

## 2012-11-15 ENCOUNTER — Telehealth: Payer: Self-pay | Admitting: Cardiovascular Disease

## 2012-11-15 NOTE — Telephone Encounter (Signed)
Pt's husband informed. Understanding verb

## 2012-11-15 NOTE — Telephone Encounter (Signed)
I reassured husband we did receive these readings and Dr. Mariah Milling is aware. I explained we would contact pt and husband when he looks at these and give his response.   Husband says he is concerned b/c BPs are not "going below 150 mm hg systolic" on some days and he is concerned about her "kidneys". I reassured husband I would try to have Dr. Mariah Milling address before leaving office today. Understanding verb. He asks that I call his cell phone [phone]

## 2012-11-15 NOTE — Telephone Encounter (Signed)
I discussed most recent BP readings with Dr. Mariah Milling, who advised to instruct pt to increase hydralazine to 75 mg QID but to take hydralazine 50 mg if SBP less than 150 mm hg. I will call husband to advise.

## 2012-11-15 NOTE — Telephone Encounter (Signed)
Pt's spouse called to see if Dr. Mariah Milling read the blood pressure documentation they dropped off a couple of weeks ago and would like to know if he feels there needs to be any changes to her care.  Please call to advise.

## 2012-11-19 NOTE — Telephone Encounter (Signed)
I have called the number to ask for an appeal, they state that we need to fax over to [phone] the patient demographic, providers information, the name of medication strength and the reason why patient needs this medication.

## 2012-11-26 NOTE — Telephone Encounter (Signed)
[name] from [employer] called back asking that we return his call at [phone] he states he has a few questions in regards to this prior authorization.

## 2012-11-26 NOTE — Telephone Encounter (Signed)
Received another Prior authorization request sheet, Dr. Lorin Picket signed and I have faxed to them at [phone].

## 2012-11-27 NOTE — Telephone Encounter (Signed)
Called back to speak with Jill Castro, They have know idea who he is. Patient was denied they need more information and would like a (letter of necessity) sent to [facility], [id number], [place] [address] Dr name [phone] Case [id number] Drug name Reason drug perscription

## 2012-11-28 ENCOUNTER — Other Ambulatory Visit: Payer: Self-pay | Admitting: Internal Medicine

## 2012-11-28 NOTE — Telephone Encounter (Signed)
Form placed on Jill Castro's desk.

## 2012-11-29 ENCOUNTER — Other Ambulatory Visit: Payer: Self-pay | Admitting: Internal Medicine

## 2012-11-29 ENCOUNTER — Other Ambulatory Visit: Payer: Self-pay | Admitting: *Deleted

## 2012-11-29 ENCOUNTER — Telehealth: Payer: Self-pay | Admitting: Cardiovascular Disease

## 2012-11-29 MED ORDER — SIMVASTATIN 10 MG PO TABS: 10.0000 mg | ORAL_TABLET | Freq: Every day | ORAL | Status: DC

## 2012-11-29 NOTE — Telephone Encounter (Signed)
Pt husband called stating pt BP readings have been high today. Pt meds were increased week ago. Pt has taken 2 1/2 hydralazines total since 7:00 am. Pt has been experiencing shoulder pain also dont think related to BP  161/80 156/74 156/80 152/73 159/79 150/78 178/84

## 2012-11-29 NOTE — Telephone Encounter (Signed)
Filled script for Zocor 10 mg

## 2012-11-29 NOTE — Telephone Encounter (Signed)
Pt's husband says he is concerned about pt's BP being elevated  Says at 2 pm today it was 178/84, then 2:30= 159/81, 3pm=160/77, now=165/80, HR=78 BPM Pt has taken a total of 4 hydralazine today (as prescribed) Husband mentions pt being in a fair amount of pain in Right shoulder, says it is worse first thing in am and pt has a hard time raising right arm without having "excrutiating pain" This began to bother her x 2 nights ago I explained the pain she is having could also be contributing to elevated BP and should try Tylenol for pain She has not contacted her PCP about the shoulder pain I advised husband to do this I had him hold while I contacted PCP office and made appt with NP tomm at 0915 Husband is aware and will take pt to appt tomm We will see how BP is after pain is controlled He will try tylenol and ice/heat tonight

## 2012-11-30 ENCOUNTER — Ambulatory Visit (INDEPENDENT_AMBULATORY_CARE_PROVIDER_SITE_OTHER): Payer: 59 | Admitting: Adult Health

## 2012-11-30 ENCOUNTER — Encounter: Payer: Self-pay | Admitting: Adult Health

## 2012-11-30 VITALS — BP 140/84 | HR 62 | Temp 98.2°F | Ht 64.0 in | Wt 150.0 lb

## 2012-11-30 DIAGNOSIS — I10 Essential (primary) hypertension: Secondary | ICD-10-CM

## 2012-11-30 DIAGNOSIS — M25511 Pain in right shoulder: Secondary | ICD-10-CM | POA: Insufficient documentation

## 2012-11-30 DIAGNOSIS — M25519 Pain in unspecified shoulder: Secondary | ICD-10-CM

## 2012-11-30 NOTE — Patient Instructions (Addendum)
  For your right shoulder pain:  Try to stay off that side during the night when you sleep - lying on that side seems to aggravate your symptoms.  Take tylenol as needed to help with pain. Remember not to exceed 3,200 mg in one day. This is the recommended maximum dose of anyone over the age of 69.  Apply ice alternating with heat for 20 minutes at a time for 3-4 times daily as needed.  Biofreeze may also provide some relief. Do not use heat with the Biofreeze.  If your pain does not improve then we will need to get an xray.

## 2012-11-30 NOTE — Assessment & Plan Note (Addendum)
Patient is followed by Dr. Mariah Milling. Recent adjustment of hydralazine. Patient presents with 7-10 daily readings of b/p beginning ~ at 5am through 11pm.  Discussed that pain can increase her systolic pressure as well as the anticipation of it being elevated. B/P during visit today was 140/84. Suggested, perhaps, to check b/p once daily after she takes her medications and is sitting for 5 minutes. Patient has an older b/p machine which she states has never been checked for acuracy.

## 2012-11-30 NOTE — Assessment & Plan Note (Addendum)
Suspect arthritis. Also severe scoliosis causes muscle compensation which could definitely be contributing to her pain. Pain improved today since she did not sleep on her right side. Tylenol for pain. Do not exceed 3200 mg/day. Alternate with ice and heat for 20 minutes 3-4 times daily. Biofreeze to joint area. Do not apply heat with the biofreeze. Continue to stay off of the right shoulder during sleeping. Consider xray of right shoulder if no resolution of symptoms. Note, greater than 25 minutes was spent with patient in face to face communication.

## 2012-11-30 NOTE — Progress Notes (Signed)
Subjective:    [name], female    DOB: 02-01-1927, 79 y.o.   MRN: [id number]  HPI  Patient is a pleasant 79 y/o female with multiple medical problems including HTN, HLD, diastolic HF, CKD, scoliosis, osteoporosis who presents to clinic with c/o right shoulder pain which began ~ 1 week ago. Pt reports pain intensified yesterday. Patient denies any activity which may have contributed to this pain. She denies any falls. She reports sleeping mainly on her right side. Patient feels pain is improved this morning since trying to stay off of that side during the night. Patient does not tolerate pain medication well. She reports episodes of hallucinations but does not remember with which med.  Patient also presents with a list of numerous, daily blood pressure readings. Reports recent change in her hydralazine as instructed by Dr. Mariah Milling.   Current Outpatient Prescriptions on File Prior to Visit  Medication Sig Dispense Refill  . aspirin 81 MG EC tablet Take 81 mg by mouth daily.        . B Complex Vitamins (PA B-COMPLEX WITH B-12) TABS Take 1 tablet by mouth daily.        . brimonidine (ALPHAGAN P) 0.1 % SOLN Place 1 drop into the left eye 2 (two) times daily.      . Calcium Carbonate-Vitamin D (CALCIUM-VITAMIN D) 600-200 MG-UNIT CAPS Take by mouth. Takes 1/2 tablet daily.      . dorzolamide-timolol (COSOPT) 22.3-6.8 MG/ML ophthalmic solution       . furosemide (LASIX) 40 MG tablet Take 1 tablet (40 mg total) by mouth daily.  30 tablet  11  . Glucosamine-Chondroitin 1500-1200 MG/30ML LIQD Take by mouth. Takes 1/2 tablet daily.      . hydrALAZINE (APRESOLINE) 50 MG tablet Take 50 mg by mouth 4 (four) times daily. Patient takes 75 mg      . latanoprost (XALATAN) 0.005 % ophthalmic solution       . losartan (COZAAR) 100 MG tablet Take 1 tablet (100 mg total) by mouth daily.  30 tablet  5  . magnesium gluconate (MAGONATE) 500 MG tablet Take 500 mg by mouth daily.        . nebivolol  (BYSTOLIC) 5 MG tablet Take 1 tablet (5 mg total) by mouth 2 (two) times daily.  45 tablet  5  . omeprazole (PRILOSEC OTC) 20 MG tablet Take 20 mg by mouth 2 (two) times daily.       Marland Kitchen omeprazole (PRILOSEC) 20 MG capsule       . Polyethylene Glycol 3350 (MIRALAX PO) Take by mouth at bedtime.        . simvastatin (ZOCOR) 10 MG tablet Take 1 tablet (10 mg total) by mouth at bedtime.  90 tablet  3  . vitamin C (ASCORBIC ACID) 500 MG tablet Take 500 mg by mouth daily.        Marland Kitchen lidocaine (LIDODERM) 5 % Place 1 patch onto the skin daily. Remove & Discard patch within 12 hours or as directed by MD.  prn  30 patch  0  . multivitamin (THERAGRAN) per tablet Take 1 tablet by mouth daily.        . NON FORMULARY Lantanoprost eye drops takes one drop both eyes daily.         Review of Systems  Constitutional: Negative.   HENT: Negative.   Respiratory: Negative.   Cardiovascular: Negative.   Musculoskeletal:       Pain in right shoulder. Pain worse  when lying on her right side.      BP 140/84  Pulse 62  Temp 98.2 F (36.8 C) (Oral)  Ht 5\' 4"  (1.626 m)  Wt 150 lb (68.04 kg)  BMI 25.75 kg/m2  SpO2 97%   Objective:   Physical Exam  Constitutional: She is oriented to person, place, and time. She appears well-developed and well-nourished. No distress.  Cardiovascular: Normal rate, regular rhythm and normal heart sounds.   No murmur heard. Pulmonary/Chest: Effort normal and breath sounds normal. She has no wheezes. She has no rales.  Abdominal: Soft. Bowel sounds are normal.  Musculoskeletal: She exhibits no edema and no tenderness.       Able to abduct and adduct right arm without pain. No swelling noted in right shoulder. Crepitus with abduction of right arm. Scoliosis and kyphosis.  Neurological: She is alert and oriented to person, place, and time.  Skin: Skin is warm and dry. No rash noted.  Psychiatric: She has a normal mood and affect. Her behavior is normal. Thought content normal.            Assessment & Plan:

## 2012-12-03 ENCOUNTER — Telehealth: Payer: Self-pay | Admitting: *Deleted

## 2012-12-03 NOTE — Telephone Encounter (Signed)
Called patient to notify her that her insurance is denying pain patches. Patient not at home, information left with patient's husband.

## 2012-12-17 ENCOUNTER — Other Ambulatory Visit: Payer: Self-pay | Admitting: Cardiovascular Disease

## 2012-12-17 NOTE — Telephone Encounter (Signed)
Refilled Bystolic

## 2013-01-13 ENCOUNTER — Other Ambulatory Visit: Payer: Self-pay | Admitting: Cardiovascular Disease

## 2013-03-15 ENCOUNTER — Encounter: Payer: Self-pay | Admitting: Internal Medicine

## 2013-03-15 DIAGNOSIS — K219 Gastro-esophageal reflux disease without esophagitis: Secondary | ICD-10-CM | POA: Insufficient documentation

## 2013-03-22 ENCOUNTER — Telehealth: Payer: Self-pay | Admitting: *Deleted

## 2013-03-22 NOTE — Telephone Encounter (Signed)
Called [phone] for prior authorization on the Omeprazole 20 mg, form is being faxed over now

## 2013-03-25 ENCOUNTER — Telehealth: Payer: Self-pay | Admitting: *Deleted

## 2013-03-25 NOTE — Telephone Encounter (Signed)
Prior Authorization Criteria question have been faxed over

## 2013-03-27 ENCOUNTER — Telehealth: Payer: Self-pay | Admitting: *Deleted

## 2013-03-27 ENCOUNTER — Other Ambulatory Visit: Payer: Self-pay | Admitting: Cardiovascular Disease

## 2013-03-27 NOTE — Telephone Encounter (Signed)
Refilled Losartan sent to [facility].

## 2013-03-27 NOTE — Telephone Encounter (Signed)
Received a Notice of Denial for the Omeprazole Capsule

## 2013-04-01 ENCOUNTER — Other Ambulatory Visit: Payer: Self-pay | Admitting: *Deleted

## 2013-04-01 MED ORDER — OMEPRAZOLE MAGNESIUM 20 MG PO TBEC: 20.0000 mg | DELAYED_RELEASE_TABLET | Freq: Every day | ORAL | Status: DC

## 2013-04-01 NOTE — Telephone Encounter (Signed)
Spoke with pt & husband. I notified them that her ins company denied the Omeprazole 20mg  BID, & pt informed to take Omeprazole 20mg  daily & add Zantac 150mg  before supper. I sent in RX for Omeprazole daily electronically

## 2013-04-15 ENCOUNTER — Ambulatory Visit (INDEPENDENT_AMBULATORY_CARE_PROVIDER_SITE_OTHER): Payer: 59 | Admitting: Cardiovascular Disease

## 2013-04-15 ENCOUNTER — Encounter: Payer: Self-pay | Admitting: Cardiovascular Disease

## 2013-04-15 VITALS — BP 160/80 | HR 60 | Resp 16 | Ht 64.0 in | Wt 160.0 lb

## 2013-04-15 DIAGNOSIS — G8929 Other chronic pain: Secondary | ICD-10-CM

## 2013-04-15 DIAGNOSIS — I10 Essential (primary) hypertension: Secondary | ICD-10-CM

## 2013-04-15 DIAGNOSIS — R42 Dizziness and giddiness: Secondary | ICD-10-CM

## 2013-04-15 DIAGNOSIS — E785 Hyperlipidemia, unspecified: Secondary | ICD-10-CM

## 2013-04-15 DIAGNOSIS — I5032 Chronic diastolic (congestive) heart failure: Secondary | ICD-10-CM

## 2013-04-15 DIAGNOSIS — R609 Edema, unspecified: Secondary | ICD-10-CM

## 2013-04-15 DIAGNOSIS — M549 Dorsalgia, unspecified: Secondary | ICD-10-CM

## 2013-04-15 NOTE — Assessment & Plan Note (Signed)
Blood pressure significantly improved on simvastatin. Encouraged her to stay on the medication

## 2013-04-15 NOTE — Patient Instructions (Addendum)
You are doing well. No medication changes were made.  Please call us if you have new issues that need to be addressed before your next appt.  Your physician wants you to follow-up in: 6 months.  You will receive a reminder letter in the mail two months in advance. If you don't receive a letter, please call our office to schedule the follow-up appointment.   

## 2013-04-15 NOTE — Assessment & Plan Note (Signed)
Stable on today's visit, compression hose in place. Suspect dependent edema.

## 2013-04-15 NOTE — Assessment & Plan Note (Signed)
Within adequate range. No changes to her medications.

## 2013-04-15 NOTE — Progress Notes (Signed)
[name], female    DOB: 03-Feb-1927, 79 y.o.   MRN: [id number]  HPI Comments: 79 yo WF with history of HTN and diastolic CHF, chronic lower extremity edema, presents for followup.  She has severe scoliosis and chronic back pain and requires pain medication.   She reports that she was doing very well but continues to be in chronic pain from her back, Also dealing with an eye problem, macular degeneration.  She takes hydralazine 3 times a day, Blood pressure has been well-controlled  with systolic pressure typically 140-150, occasional 130.  Rare systolic pressure 160 She reports having an episode of weakness yesterday. She is starting various over-the-counter medications for pain   catheterization in 2003 showing luminal irregularities,  negative stress test in October 2010 with no ischemia, echocardiogram August 2010 showing normal systolic function, mild to moderate tricuspid regurgitation, mildly elevated right ventricular systolic pressures consistent with mild pulmonary hypertension, ejection fraction 60%.  EKG shows normal sinus rhythm with rate 60   beats per minute,  no significant ST or T wave changes. LVH by voltage criteria      Outpatient Encounter Prescriptions as of 04/15/2013  Medication Sig Dispense Refill  . aspirin 81 MG EC tablet Take 81 mg by mouth daily.        . B Complex Vitamins (PA B-COMPLEX WITH B-12) TABS Take 1 tablet by mouth daily.        . brimonidine (ALPHAGAN P) 0.1 % SOLN Place 1 drop into the left eye 2 (two) times daily.      Marland Kitchen BYSTOLIC 5 MG tablet TAKE 1 TABLET (5 MG TOTAL) BY MOUTH 2 (TWO) TIMES DAILY.  60 tablet  4  . Calcium Carbonate-Vitamin D (CALCIUM-VITAMIN D) 600-200 MG-UNIT CAPS Take by mouth. Takes 1/2 tablet daily.      . dorzolamide-timolol (COSOPT) 22.3-6.8 MG/ML ophthalmic solution       . furosemide (LASIX) 40 MG tablet Take 1 tablet (40 mg total) by mouth daily.  30 tablet  11  . Glucosamine-Chondroitin 1500-1200  MG/30ML LIQD Take by mouth. Takes 1/2 tablet daily.      . hydrALAZINE (APRESOLINE) 50 MG tablet Take 50 mg by mouth 3 (three) times daily.       Marland Kitchen latanoprost (XALATAN) 0.005 % ophthalmic solution       . lidocaine (LIDODERM) 5 % Place 1 patch onto the skin daily. Remove & Discard patch within 12 hours or as directed by MD.  prn  30 patch  0  . losartan (COZAAR) 100 MG tablet TAKE 1 TABLET BY MOUTH DAILY.  30 tablet  3  . magnesium gluconate (MAGONATE) 500 MG tablet Take 500 mg by mouth daily.        . multivitamin (THERAGRAN) per tablet Take 1 tablet by mouth daily.        . nebivolol (BYSTOLIC) 5 MG tablet Take 1 tablet (5 mg total) by mouth 2 (two) times daily.  45 tablet  5  . NON FORMULARY Lantanoprost eye drops takes one drop both eyes daily.      Marland Kitchen omeprazole (PRILOSEC OTC) 20 MG tablet Take 1 tablet (20 mg total) by mouth daily.  30 tablet  5  . omeprazole (PRILOSEC) 20 MG capsule       . Polyethylene Glycol 3350 (MIRALAX PO) Take by mouth at bedtime.        . simvastatin (ZOCOR) 10 MG tablet Take 1 tablet (10 mg total) by mouth at bedtime.  90 tablet  3  . vitamin C (ASCORBIC ACID) 500 MG tablet Take 500 mg by mouth daily.        . [DISCONTINUED] BYSTOLIC 5 MG tablet TAKE 1 TABLET (5 MG TOTAL) BY MOUTH 2 (TWO) TIMES DAILY.  60 tablet  4   No facility-administered encounter medications on file as of 04/15/2013.     Review of Systems  Constitutional: Negative.   HENT: Negative.   Eyes: Negative.   Respiratory: Negative.   Cardiovascular: Positive for leg swelling.  Gastrointestinal: Negative.   Musculoskeletal: Positive for back pain.  Skin: Negative.   Neurological: Negative.   Psychiatric/Behavioral: Negative.   All other systems reviewed and are negative.    BP 160/80  Ht 5\' 4"  (1.626 m)  Wt 160 lb (72.576 kg)  BMI 27.45 kg/m2  Physical Exam  Nursing note and vitals reviewed. Constitutional: She is oriented to person, place, and time. She appears well-developed and  well-nourished.  HENT:  Head: Normocephalic.  Nose: Nose normal.  Mouth/Throat: Oropharynx is clear and moist.  Eyes: Conjunctivae are normal. Pupils are equal, round, and reactive to light.  Neck: Normal range of motion. Neck supple. No JVD present.  Cardiovascular: Normal rate, regular rhythm, S1 normal, S2 normal, normal heart sounds and intact distal pulses.  Exam reveals no gallop and no friction rub.   No murmur heard. Nonpitting edema, worse on the left below the knee  Pulmonary/Chest: Effort normal and breath sounds normal. No respiratory distress. She has no wheezes. She has no rales. She exhibits no tenderness.  Abdominal: Soft. Bowel sounds are normal. She exhibits no distension. There is no tenderness.  Musculoskeletal: Normal range of motion. She exhibits no edema and no tenderness.  Severe scoliosis and kyphosis  Lymphadenopathy:    She has no cervical adenopathy.  Neurological: She is alert and oriented to person, place, and time. Coordination normal.  Skin: Skin is warm and dry. No rash noted. No erythema.  Psychiatric: She has a normal mood and affect. Her behavior is normal. Judgment and thought content normal.    Assessment and Plan

## 2013-04-15 NOTE — Assessment & Plan Note (Signed)
Currently using over-the-counter medications for pain including creams

## 2013-04-15 NOTE — Assessment & Plan Note (Signed)
No signs of heart failure on current regimen of diuretic

## 2013-04-17 ENCOUNTER — Ambulatory Visit (INDEPENDENT_AMBULATORY_CARE_PROVIDER_SITE_OTHER): Payer: 59 | Admitting: Internal Medicine

## 2013-04-17 ENCOUNTER — Encounter: Payer: Self-pay | Admitting: Internal Medicine

## 2013-04-17 VITALS — BP 122/68 | HR 60 | Temp 98.3°F | Ht 64.0 in | Wt 159.0 lb

## 2013-04-17 DIAGNOSIS — E875 Hyperkalemia: Secondary | ICD-10-CM

## 2013-04-17 DIAGNOSIS — R609 Edema, unspecified: Secondary | ICD-10-CM

## 2013-04-17 DIAGNOSIS — C541 Malignant neoplasm of endometrium: Secondary | ICD-10-CM

## 2013-04-17 DIAGNOSIS — C549 Malignant neoplasm of corpus uteri, unspecified: Secondary | ICD-10-CM

## 2013-04-17 DIAGNOSIS — M810 Age-related osteoporosis without current pathological fracture: Secondary | ICD-10-CM

## 2013-04-17 DIAGNOSIS — K219 Gastro-esophageal reflux disease without esophagitis: Secondary | ICD-10-CM

## 2013-04-17 DIAGNOSIS — M25519 Pain in unspecified shoulder: Secondary | ICD-10-CM

## 2013-04-17 DIAGNOSIS — E785 Hyperlipidemia, unspecified: Secondary | ICD-10-CM

## 2013-04-17 DIAGNOSIS — M549 Dorsalgia, unspecified: Secondary | ICD-10-CM

## 2013-04-17 DIAGNOSIS — G8929 Other chronic pain: Secondary | ICD-10-CM

## 2013-04-17 DIAGNOSIS — M25511 Pain in right shoulder: Secondary | ICD-10-CM

## 2013-04-17 DIAGNOSIS — I10 Essential (primary) hypertension: Secondary | ICD-10-CM

## 2013-04-17 DIAGNOSIS — N183 Chronic kidney disease, stage 3 unspecified: Secondary | ICD-10-CM

## 2013-04-17 NOTE — Assessment & Plan Note (Signed)
Followed by Dr Singh.  Cr has been stable at 1.4-1.8.  Follow.    

## 2013-04-17 NOTE — Assessment & Plan Note (Signed)
Improved with biofreeze.

## 2013-04-17 NOTE — Assessment & Plan Note (Signed)
Saw Dr Mariah Milling last week.  No changes made.  Blood pressure under good control.  Check metabolic panel.

## 2013-04-17 NOTE — Progress Notes (Signed)
Subjective:    [name], female    DOB: Mar 06, 1927, 79 y.o.   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  She states she is still having increased back pain.  This is her main complaint.  She saw Dr Mariah Milling last week.  Everything stable - cardiac.  She is unable to take narcotic pain meds, antiinflammatories and did not tolerate ultram.  Tylenol does not control her pain.  She has seen surgery and is not a surgical candidate.  Seeing opthalmology for her eyes.  S/p injections in her left eye.  Just evaluated last week.  Has f/u in 8 weeks.  Saw Dr Feliberto Gottron about one month ago.  States pap and stool test ok.  No acid reflux.  Biofreeze helps her shoulder.     Past Medical History  Diagnosis Date  . S/P cardiac catheterization 05/2002    Luminal irregularities only in the coronaries. EF 55%. There was some irregulatrity in the right renal artery suggestive of possible fibromuscular dysplasia. Myoview in 5/08 showed no ischemia or infarction. Lexiscan myoview at [facility] (10/10) showed EF 55%, normal wall motion, no evience ofor ischemia or infarction.  . Diastolic CHF, acute     Echo (3/08) with EF 60-65%, mild LVH, mild to moderate TR, PASP 40 mmHg.  Marland Kitchen HTN (hypertension)   . Dizziness     Thought to be side effect of Norvasc  . Hypercalcemia     On HCTZ  . Scoliosis associated with other condition     Chronic back pain  . GERD (gastroesophageal reflux disease)   . Uterine cancer     S/P hypsterectomy in 2008  . Hyperkalemia   . Macular degeneration   . Renal insufficiency   . Hypercholesterolemia   . Osteoporosis   . Renal fibromuscular dysplasia   . Glaucoma     Current Outpatient Prescriptions on File Prior to Visit  Medication Sig Dispense Refill  .  aspirin 81 MG EC tablet Take 81 mg by mouth daily.        . B Complex Vitamins (PA B-COMPLEX WITH B-12) TABS Take 1 tablet by mouth daily.        . brimonidine (ALPHAGAN P) 0.1 % SOLN Place 1 drop into the left eye 2 (two) times daily.      Marland Kitchen BYSTOLIC 5 MG tablet TAKE 1 TABLET (5 MG TOTAL) BY MOUTH 2 (TWO) TIMES DAILY.  60 tablet  4  . Calcium Carbonate-Vitamin D (CALCIUM-VITAMIN D) 600-200 MG-UNIT CAPS Take by mouth. Takes 1/2 tablet daily.      . dorzolamide-timolol (COSOPT) 22.3-6.8 MG/ML ophthalmic solution       . furosemide (LASIX) 40 MG tablet Take 1 tablet (40 mg total) by mouth daily.  30 tablet  11  . Glucosamine-Chondroitin 1500-1200 MG/30ML LIQD Take by mouth. Takes 1/2 tablet daily.      . hydrALAZINE (APRESOLINE) 50 MG tablet Take 50 mg by mouth 3 (three) times daily.       Marland Kitchen latanoprost (XALATAN) 0.005 % ophthalmic solution       . losartan (COZAAR) 100 MG tablet TAKE 1 TABLET BY MOUTH DAILY.  30 tablet  3  . magnesium gluconate (MAGONATE) 500 MG tablet Take 500 mg by mouth daily.        Marland Kitchen  multivitamin (THERAGRAN) per tablet Take 1 tablet by mouth daily.        . nebivolol (BYSTOLIC) 5 MG tablet Take 1 tablet (5 mg total) by mouth 2 (two) times daily.  45 tablet  5  . NON FORMULARY Lantanoprost eye drops takes one drop both eyes daily.      Marland Kitchen omeprazole (PRILOSEC OTC) 20 MG tablet Take 1 tablet (20 mg total) by mouth daily.  30 tablet  5  . Polyethylene Glycol 3350 (MIRALAX PO) Take by mouth at bedtime.        . simvastatin (ZOCOR) 10 MG tablet Take 1 tablet (10 mg total) by mouth at bedtime.  90 tablet  3  . vitamin C (ASCORBIC ACID) 500 MG tablet Take 500 mg by mouth daily.        Marland Kitchen lidocaine (LIDODERM) 5 % Place 1 patch onto the skin daily. Remove & Discard patch within 12 hours or as directed by MD.  prn  30 patch  0   No current facility-administered medications on file prior to visit.    Review of Systems Patient denies any headache, lightheadedness or dizziness.  No  sinus or allergy symptoms.  No chest pain, tightness or palpitations.  No increased shortness of breath, cough or congestion.  No nausea or vomiting.  No abdominal pain or cramping.  No bowel change, such as diarrhea, constipation, BRBPR or melana.  No urine change.  Still with back pain as outlined.  Shoulder better with biofreeze.  Takes tylenol prn.  No acid reflux.  Controlled.        Objective:   Physical Exam  Filed Vitals:   04/17/13 0803  BP: 122/68  Pulse: 60  Temp: 98.3 F (36.8 C)   Blood pressure recheck:  122/68, pulse 52  79 year old female in no acute distress.   HEENT:  Nares - clear.  OP- without lesions or erythema.  NECK:  Supple, nontender.  No audiblebruit.   HEART:  Appears to be regular. LUNGS:  Without crackles or wheezing audible.  Respirations even and unlabored.   RADIAL PULSE:  Equal bilaterally. ABDOMEN:  Soft, nontender.  Audible abdominal bruit.   EXTREMITIES:  No increased edema to be present.  (left > right).  Stable.                    Assessment & Plan:  CARDIOVASCULAR.  Sees Dr Mariah Milling.  09/16/09 stress test revealed normal LV function with no evidence of ischemia.  Just evaluated.  Stable.    GI.  Colonoscopy 06/04/10 revealed marked tortuosity.  90% of the colon was evaluated.  Did not identify the cecum. No polyps.  Did have diverticulosis.  Stable.    ABDOMINAL BRUIT.  Previous aortic ultrasound negative for aneurysm.  Follow.   HISTORY OF RENAL CYSTS.  Followed by nephrology.   HEALTH MAINTENANCE.  Physical 10/18/12.  Gets her pelvics and paps through GYN (Dr Feliberto Gottron).  Just had one month ago and states everything checked out fine.  GI as above.  Mammogram 10/24/12 - BiRADS II.

## 2013-04-17 NOTE — Assessment & Plan Note (Signed)
Followed by Dr Feliberto Gottron.  Just evaluated last month.  Pap ok per pt report.  Obtain records.

## 2013-04-17 NOTE — Assessment & Plan Note (Signed)
Stable.  Continue support hose.   

## 2013-04-17 NOTE — Assessment & Plan Note (Signed)
Previous vitamin D level wnl.  Unable to take bisphosphonates.  Follow.   

## 2013-04-17 NOTE — Assessment & Plan Note (Signed)
Controlled on current regimen.  Follow.  

## 2013-04-17 NOTE — Assessment & Plan Note (Signed)
If she watches her diet, potassium stays wnl.  Follow.    

## 2013-04-17 NOTE — Assessment & Plan Note (Signed)
Remains on simvastatin.  Low cholesterol diet.  Check lipid panel and liver function with next labs.   

## 2013-04-17 NOTE — Assessment & Plan Note (Signed)
Has known scoliosis.  Has seen surgery.  Not a surgical candidate.  Unable to take narcotic pain meds, antiinflammatories and is unable to tolerate tramadol.  Uses tylenol prn.

## 2013-04-26 ENCOUNTER — Other Ambulatory Visit: Payer: Self-pay | Admitting: Cardiovascular Disease

## 2013-04-26 NOTE — Telephone Encounter (Signed)
Refilled Losartan sent to [facility].

## 2013-05-09 ENCOUNTER — Other Ambulatory Visit (INDEPENDENT_AMBULATORY_CARE_PROVIDER_SITE_OTHER): Payer: Medicare Other

## 2013-05-09 ENCOUNTER — Encounter: Payer: Self-pay | Admitting: *Deleted

## 2013-05-09 DIAGNOSIS — I10 Essential (primary) hypertension: Secondary | ICD-10-CM

## 2013-05-09 DIAGNOSIS — E785 Hyperlipidemia, unspecified: Secondary | ICD-10-CM

## 2013-05-09 LAB — HEPATIC FUNCTION PANEL
ALT: 18 U/L (ref 0–35)
Total Bilirubin: 0.6 mg/dL (ref 0.3–1.2)
Total Protein: 7 g/dL (ref 6.0–8.3)

## 2013-05-09 LAB — BASIC METABOLIC PANEL
GFR: 32.39 mL/min — ABNORMAL LOW (ref >60.00)
Glucose, Bld: 91 mg/dL (ref 70–99)
Potassium: 4.6 mEq/L (ref 3.5–5.1)
Sodium: 137 mEq/L (ref 135–145)

## 2013-05-09 LAB — LIPID PANEL
HDL: 74.1 mg/dL (ref >39.00)
VLDL: 15.6 mg/dL (ref 0.0–40.0)

## 2013-05-09 LAB — TSH: TSH: 2.53 u[IU]/mL (ref 0.35–5.50)

## 2013-05-13 ENCOUNTER — Other Ambulatory Visit: Payer: Self-pay | Admitting: Cardiovascular Disease

## 2013-05-13 NOTE — Telephone Encounter (Signed)
Refilled Bystolic sent to [facility] pharmacy.

## 2013-06-14 ENCOUNTER — Other Ambulatory Visit: Payer: Self-pay | Admitting: Cardiovascular Disease

## 2013-06-14 NOTE — Telephone Encounter (Signed)
Refilled Bystolic sent to [facility] pharmacy.

## 2013-06-17 ENCOUNTER — Other Ambulatory Visit: Payer: Self-pay | Admitting: Cardiovascular Disease

## 2013-06-17 NOTE — Telephone Encounter (Signed)
Refilled Bystolic sent to [facility] pharmacy. 

## 2013-07-03 ENCOUNTER — Ambulatory Visit: Payer: Self-pay | Admitting: Ophthalmology

## 2013-07-03 HISTORY — PX: CATARACT EXTRACTION: SUR2

## 2013-08-26 ENCOUNTER — Other Ambulatory Visit: Payer: Self-pay | Admitting: Internal Medicine

## 2013-08-27 ENCOUNTER — Other Ambulatory Visit: Payer: Self-pay | Admitting: Cardiovascular Disease

## 2013-08-27 NOTE — Telephone Encounter (Signed)
Refilled Losartan sent to [facility].

## 2013-09-16 ENCOUNTER — Other Ambulatory Visit: Payer: Self-pay | Admitting: Cardiovascular Disease

## 2013-09-16 ENCOUNTER — Other Ambulatory Visit: Payer: Self-pay

## 2013-09-16 MED ORDER — NEBIVOLOL HCL 5 MG PO TABS: ORAL_TABLET | ORAL | Status: DC

## 2013-09-16 NOTE — Telephone Encounter (Signed)
Refill sent for bystolic 

## 2013-09-27 ENCOUNTER — Other Ambulatory Visit: Payer: Self-pay | Admitting: *Deleted

## 2013-09-27 ENCOUNTER — Other Ambulatory Visit: Payer: Self-pay | Admitting: Cardiovascular Disease

## 2013-09-27 MED ORDER — LOSARTAN POTASSIUM 100 MG PO TABS: ORAL_TABLET | ORAL | Status: DC

## 2013-09-27 NOTE — Telephone Encounter (Signed)
Requested Prescriptions   Signed Prescriptions Disp Refills  . losartan (COZAAR) 100 MG tablet 30 tablet 3    Sig: TAKE 1 TABLET BY MOUTH DAILY.    Authorizing Provider: Antonieta Iba    Ordering User: Kendrick Fries

## 2013-10-14 ENCOUNTER — Ambulatory Visit (INDEPENDENT_AMBULATORY_CARE_PROVIDER_SITE_OTHER): Payer: 59 | Admitting: Cardiovascular Disease

## 2013-10-14 ENCOUNTER — Encounter: Payer: Self-pay | Admitting: Cardiovascular Disease

## 2013-10-14 VITALS — BP 122/78 | HR 69 | Ht 65.0 in | Wt 155.5 lb

## 2013-10-14 DIAGNOSIS — I5032 Chronic diastolic (congestive) heart failure: Secondary | ICD-10-CM

## 2013-10-14 DIAGNOSIS — I10 Essential (primary) hypertension: Secondary | ICD-10-CM

## 2013-10-14 DIAGNOSIS — E785 Hyperlipidemia, unspecified: Secondary | ICD-10-CM

## 2013-10-14 NOTE — Assessment & Plan Note (Signed)
Encouraged her to stay on her simvastatin 

## 2013-10-14 NOTE — Assessment & Plan Note (Signed)
Blood pressure is well controlled on today's visit. No changes made to the medications. 

## 2013-10-14 NOTE — Progress Notes (Signed)
[name], female    DOB: 03-Nov-1927, 79 y.o.   MRN: [id number]  HPI Comments: Jill Castro is an 79 yo pleasant woman with history of HTN and diastolic CHF, chronic lower extremity edema, presents for followup.  She has severe scoliosis and chronic back pain and requires pain medication.   She reports that she was doing very well but continues to be in chronic pain from her back, Also dealing with an eye problem, macular degeneration.  She takes hydralazine 3 times a day, bystolic 5 mg BID with losartan daily. Blood pressure has been well-controlled  with systolic pressure typically 130-150,  She has minimal nonpitting leg edema. Tries to wear compression hose when she can. On Lasix daily, creatinine 1.6 with elevated BUN. She does have followup with renal.   catheterization in 2003 showing luminal irregularities,  negative stress test in October 2010 with no ischemia, echocardiogram August 2010 showing normal systolic function, mild to moderate tricuspid regurgitation, mildly elevated right ventricular systolic pressures consistent with mild pulmonary hypertension, ejection fraction 60%.  EKG shows normal sinus rhythm with rate 69   beats per minute,  no significant ST or T wave changes. LVH by voltage criteria      Outpatient Encounter Prescriptions as of 10/14/2013  Medication Sig  . aspirin 81 MG EC tablet Take 81 mg by mouth daily.    . B Complex Vitamins (PA B-COMPLEX WITH B-12) TABS Take 1 tablet by mouth daily.    . brimonidine (ALPHAGAN P) 0.1 % SOLN Place 1 drop into the left eye 2 (two) times daily.  Marland Kitchen BYSTOLIC 5 MG tablet TAKE 1 TABLET (5 MG TOTAL) BY MOUTH 2 (TWO) TIMES DAILY.  . Calcium Carbonate-Vitamin D (CALCIUM-VITAMIN D) 600-200 MG-UNIT CAPS Take by mouth. Takes 1/2 tablet daily.  . dorzolamide-timolol (COSOPT) 22.3-6.8 MG/ML ophthalmic solution Place 1 drop into the left eye 2 (two) times daily.   . furosemide (LASIX) 40 MG tablet Take 1 tablet  (40 mg total) by mouth daily.  . Glucosamine-Chondroitin 1500-1200 MG/30ML LIQD Take by mouth. Takes 1/2 tablet daily.  . hydrALAZINE (APRESOLINE) 50 MG tablet Take 50 mg by mouth 3 (three) times daily.   Marland Kitchen latanoprost (XALATAN) 0.005 % ophthalmic solution Place 1 drop into the left eye at bedtime.   . lidocaine (LIDODERM) 5 % Place 1 patch onto the skin daily. Remove & Discard patch within 12 hours or as directed by MD.  prn  . losartan (COZAAR) 100 MG tablet TAKE 1 TABLET BY MOUTH DAILY.  . magnesium gluconate (MAGONATE) 500 MG tablet Take 500 mg by mouth daily.    . multivitamin (THERAGRAN) per tablet Take 1 tablet by mouth daily.    Marland Kitchen omeprazole (PRILOSEC OTC) 20 MG tablet Take 1 tablet (20 mg total) by mouth daily.  . Polyethylene Glycol 3350 (MIRALAX PO) Take by mouth as needed.   . simvastatin (ZOCOR) 10 MG tablet TAKE 1 TABLET (10 MG TOTAL) BY MOUTH AT BEDTIME.  . vitamin C (ASCORBIC ACID) 500 MG tablet Take 500 mg by mouth daily.      Review of Systems  Constitutional: Negative.   HENT: Negative.   Eyes: Negative.   Respiratory: Negative.   Cardiovascular: Positive for leg swelling.  Gastrointestinal: Negative.   Endocrine: Negative.   Musculoskeletal: Positive for back pain.  Skin: Negative.   Allergic/Immunologic: Negative.   Neurological: Negative.   Hematological: Negative.   Psychiatric/Behavioral: Negative.   All other systems reviewed and are negative.  BP 122/78  Pulse 69  Ht 5\' 5"  (1.651 m)  Wt 155 lb 8 oz (70.534 kg)  BMI 25.88 kg/m2  Physical Exam  Nursing note and vitals reviewed. Constitutional: She is oriented to person, place, and time. She appears well-developed and well-nourished.  HENT:  Head: Normocephalic.  Nose: Nose normal.  Mouth/Throat: Oropharynx is clear and moist.  Eyes: Conjunctivae are normal. Pupils are equal, round, and reactive to light.  Neck: Normal range of motion. Neck supple. No JVD present.  Cardiovascular: Normal rate,  regular rhythm, S1 normal, S2 normal, normal heart sounds and intact distal pulses.  Exam reveals no gallop and no friction rub.   No murmur heard. Nonpitting edema, worse on the left below the knee  Pulmonary/Chest: Effort normal and breath sounds normal. No respiratory distress. She has no wheezes. She has no rales. She exhibits no tenderness.  Abdominal: Soft. Bowel sounds are normal. She exhibits no distension. There is no tenderness.  Musculoskeletal: Normal range of motion. She exhibits no edema and no tenderness.  Severe scoliosis and kyphosis  Lymphadenopathy:    She has no cervical adenopathy.  Neurological: She is alert and oriented to person, place, and time. Coordination normal.  Skin: Skin is warm and dry. No rash noted. No erythema.  Psychiatric: She has a normal mood and affect. Her behavior is normal. Judgment and thought content normal.    Assessment and Plan

## 2013-10-14 NOTE — Assessment & Plan Note (Signed)
She is doing very well overall, appears euvolemic, possibly mildly dehydrated. Creatinine and BUN are mildly above their baseline on last 2 lab work checks. We have suggested she take Lasix every other day. She has followup lab work in several weeks' time. Her edema is likely from venous insufficiency, not necessarily heart failure

## 2013-10-14 NOTE — Patient Instructions (Signed)
You are doing well. Pleas take the lasix every other day  Please call us if you have new issues that need to be addressed before your next appt.  Your physician wants you to follow-up in: 6 months.  You will receive a reminder letter in the mail two months in advance. If you don't receive a letter, please call our office to schedule the follow-up appointment.

## 2013-10-25 ENCOUNTER — Encounter: Payer: Self-pay | Admitting: Internal Medicine

## 2013-10-25 ENCOUNTER — Ambulatory Visit (INDEPENDENT_AMBULATORY_CARE_PROVIDER_SITE_OTHER): Payer: Medicare Other | Admitting: Internal Medicine

## 2013-10-25 VITALS — BP 100/80 | HR 64 | Temp 97.9°F | Ht 63.25 in | Wt 156.5 lb

## 2013-10-25 DIAGNOSIS — C549 Malignant neoplasm of corpus uteri, unspecified: Secondary | ICD-10-CM

## 2013-10-25 DIAGNOSIS — M549 Dorsalgia, unspecified: Secondary | ICD-10-CM

## 2013-10-25 DIAGNOSIS — G8929 Other chronic pain: Secondary | ICD-10-CM

## 2013-10-25 DIAGNOSIS — K219 Gastro-esophageal reflux disease without esophagitis: Secondary | ICD-10-CM

## 2013-10-25 DIAGNOSIS — E875 Hyperkalemia: Secondary | ICD-10-CM

## 2013-10-25 DIAGNOSIS — R609 Edema, unspecified: Secondary | ICD-10-CM

## 2013-10-25 DIAGNOSIS — Z1211 Encounter for screening for malignant neoplasm of colon: Secondary | ICD-10-CM

## 2013-10-25 DIAGNOSIS — I10 Essential (primary) hypertension: Secondary | ICD-10-CM

## 2013-10-25 DIAGNOSIS — M810 Age-related osteoporosis without current pathological fracture: Secondary | ICD-10-CM

## 2013-10-25 DIAGNOSIS — E785 Hyperlipidemia, unspecified: Secondary | ICD-10-CM

## 2013-10-25 DIAGNOSIS — C541 Malignant neoplasm of endometrium: Secondary | ICD-10-CM

## 2013-10-25 LAB — CBC WITH DIFFERENTIAL/PLATELET
Basophils Absolute: 0 10*3/uL (ref 0.0–0.1)
Eosinophils Absolute: 0.2 10*3/uL (ref 0.0–0.7)
HCT: 38.5 % (ref 36.0–46.0)
Hemoglobin: 12.8 g/dL (ref 12.0–15.0)
Lymphocytes Relative: 22.2 % (ref 12.0–46.0)
Lymphs Abs: 1.7 10*3/uL (ref 0.7–4.0)
MCV: 96.1 fl (ref 78.0–100.0)
Monocytes Relative: 8.4 % (ref 3.0–12.0)
Platelets: 214 10*3/uL (ref 150.0–400.0)
RDW: 13.4 % (ref 11.5–14.6)

## 2013-10-25 LAB — LIPID PANEL
Cholesterol: 152 mg/dL (ref 0–200)
HDL: 64.2 mg/dL (ref >39.00)
Total CHOL/HDL Ratio: 2
Triglycerides: 82 mg/dL (ref 0.0–149.0)
VLDL: 16.4 mg/dL (ref 0.0–40.0)

## 2013-10-25 LAB — HEPATIC FUNCTION PANEL
ALT: 19 U/L (ref 0–35)
AST: 24 U/L (ref 0–37)
Albumin: 4 g/dL (ref 3.5–5.2)
Total Protein: 7.3 g/dL (ref 6.0–8.3)

## 2013-10-25 LAB — BASIC METABOLIC PANEL
CO2: 24 mEq/L (ref 19–32)
Calcium: 9.8 mg/dL (ref 8.4–10.5)
Chloride: 106 mEq/L (ref 96–112)
Sodium: 136 mEq/L (ref 135–145)

## 2013-10-25 MED ORDER — TRAMADOL HCL 50 MG PO TABS: 50.0000 mg | ORAL_TABLET | Freq: Three times a day (TID) | ORAL | Status: DC | PRN

## 2013-10-25 NOTE — Progress Notes (Signed)
Pre-visit discussion using our clinic review tool. No additional management support is needed unless otherwise documented below in the visit note.  

## 2013-10-25 NOTE — Progress Notes (Signed)
Subjective:    [name], female    DOB: 11/15/1927, 79 y.o.   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   She states she is still having increased back pain.  This is her main complaint.  Now taking ultram.  Only taking daily prn.  Discussed that she could take it on a more regular basis if tolerates. Sees Dr Mariah Milling.  Everything stable - cardiac.   Tylenol does not control her pain.  She has been unable to take narcotic pain medication.  She has seen surgery and is not a surgical candidate.  Seeing opthalmology for her eyes.  S/p injections in her left eye.  Is s/p right cataract surgery.  Has done well.  Seeing Dr Feliberto Gottron.   States pap and stool test ok.  No acid reflux.  Dr Mariah Milling recently changed her lasix to qod.       Past Medical History  Diagnosis Date  . S/P cardiac catheterization 05/2002    Luminal irregularities only in the coronaries. EF 55%. There was some irregulatrity in the right renal artery suggestive of possible fibromuscular dysplasia. Myoview in 5/08 showed no ischemia or infarction. Lexiscan myoview at [facility] (10/10) showed EF 55%, normal wall motion, no evience ofor ischemia or infarction.  . Diastolic CHF, acute     Echo (8/65) with EF 60-65%, mild LVH, mild to moderate TR, PASP 40 mmHg.  Marland Kitchen HTN (hypertension)   . Dizziness     Thought to be side effect of Norvasc  . Hypercalcemia     On HCTZ  . Scoliosis associated with other condition     Chronic back pain  . GERD (gastroesophageal reflux disease)   . Uterine cancer     S/P hypsterectomy in 2008  . Hyperkalemia   . Macular degeneration   . Renal insufficiency   . Hypercholesterolemia   . Osteoporosis   . Renal fibromuscular dysplasia    . Glaucoma     Current Outpatient Prescriptions on File Prior to Visit  Medication Sig Dispense Refill  . aspirin 81 MG EC tablet Take 81 mg by mouth daily.        . B Complex Vitamins (PA B-COMPLEX WITH B-12) TABS Take 1 tablet by mouth daily.        . brimonidine (ALPHAGAN P) 0.1 % SOLN Place 1 drop into the left eye 2 (two) times daily.      Marland Kitchen BYSTOLIC 5 MG tablet TAKE 1 TABLET (5 MG TOTAL) BY MOUTH 2 (TWO) TIMES DAILY.  60 tablet  2  . Calcium Carbonate-Vitamin D (CALCIUM-VITAMIN D) 600-200 MG-UNIT CAPS Take by mouth. Takes 1/2 tablet daily.      . dorzolamide-timolol (COSOPT) 22.3-6.8 MG/ML ophthalmic solution Place 1 drop into the left eye 2 (two) times daily.       . furosemide (LASIX) 40 MG tablet Take 1 tablet (40 mg total) by mouth daily.  30 tablet  11  . Glucosamine-Chondroitin 1500-1200 MG/30ML LIQD Take by mouth. Takes 1/2 tablet daily.      . hydrALAZINE (APRESOLINE) 50 MG tablet Take 50 mg by mouth 3 (three) times daily.       Marland Kitchen latanoprost (XALATAN) 0.005 % ophthalmic solution Place 1 drop into the left eye at  bedtime.       Marland Kitchen losartan (COZAAR) 100 MG tablet TAKE 1 TABLET BY MOUTH DAILY.  30 tablet  3  . magnesium gluconate (MAGONATE) 500 MG tablet Take 500 mg by mouth daily.        . multivitamin (THERAGRAN) per tablet Take 1 tablet by mouth daily.        . Polyethylene Glycol 3350 (MIRALAX PO) Take by mouth as needed.       . simvastatin (ZOCOR) 10 MG tablet TAKE 1 TABLET (10 MG TOTAL) BY MOUTH AT BEDTIME.  90 tablet  1  . vitamin C (ASCORBIC ACID) 500 MG tablet Take 500 mg by mouth daily.         No current facility-administered medications on file prior to visit.    Review of Systems Patient denies any headache, lightheadedness or dizziness.  No sinus or allergy symptoms.  No chest pain, tightness or palpitations.  No increased shortness of breath, cough or congestion.  No nausea or vomiting.  No abdominal pain or cramping.  No bowel change, such as diarrhea,  constipation, BRBPR or melana.  No urine change.  Still with back pain as outlined.  Takes tylenol and ultram prn.  No acid reflux.  Controlled.   Following with opthalmology for her eyes.       Objective:   Physical Exam  Filed Vitals:   10/25/13 0840  BP: 100/80  Pulse: 64  Temp: 97.9 F (36.6 C)   Blood pressure recheck:  86/15  79 year old female in no acute distress.   HEENT:  Nares- clear.  Oropharynx - without lesions. NECK:  Supple.  Nontender.  No audible bruit.  HEART:  Appears to be regular. LUNGS:  No crackles or wheezing audible.  Respirations even and unlabored.  RADIAL PULSE:  Equal bilaterally.    BREASTS:  No nipple discharge or nipple retraction present.  Could not appreciate any distinct nodules or axillary adenopathy.  ABDOMEN:  Soft, nontender.  Bowel sounds present and normal.  No audible abdominal bruit.  GU: performed by gyn.  EXTREMITIES:  No increased edema present.  DP pulses palpable and equal bilaterally.           Assessment & Plan:  CARDIOVASCULAR.  Sees Dr Mariah Milling.  09/16/09 stress test revealed normal LV function with no evidence of ischemia.  Just evaluated.  Stable.  Lasix adjusted.   GI.  Colonoscopy 06/04/10 revealed marked tortuosity.  90% of the colon was evaluated.  Did not identify the cecum. No polyps.  Did have diverticulosis.  Stable.    ABDOMINAL BRUIT.  Previous aortic ultrasound negative for aneurysm.  Follow.   HISTORY OF RENAL CYSTS.  Followed by nephrology.   HEALTH MAINTENANCE.  Physical today.  Gets her pelvics and paps through GYN (Dr Architect.   GI as above.  Mammogram 10/24/12 - BiRADS II.  Schedule a f/u mammogram.  IFOB.

## 2013-10-26 LAB — VITAMIN D 25 HYDROXY (VIT D DEFICIENCY, FRACTURES): Vit D, 25-Hydroxy: 47 ng/mL (ref 30–89)

## 2013-10-27 ENCOUNTER — Encounter: Payer: Self-pay | Admitting: Internal Medicine

## 2013-10-27 NOTE — Assessment & Plan Note (Signed)
Has known scoliosis.  Has seen surgery.  Not a surgical candidate.  Unable to take narcotic pain meds, antiinflammatories.  Dr Thedore Mins started her on ultram.  She is tolerating now.  Uses tylenol prn.

## 2013-10-27 NOTE — Assessment & Plan Note (Signed)
If she watches her diet, potassium stays wnl.  Follow.    

## 2013-10-27 NOTE — Assessment & Plan Note (Signed)
Stable.  Continue support hose.   

## 2013-10-27 NOTE — Assessment & Plan Note (Signed)
Remains on simvastatin.  Low cholesterol diet.  Check lipid panel and liver function with next labs.   

## 2013-10-27 NOTE — Assessment & Plan Note (Signed)
Followed by Dr Schermerhorn.  Last Pap ok per pt report.   

## 2013-10-27 NOTE — Assessment & Plan Note (Signed)
Controlled on current regimen.  Follow.  

## 2013-10-27 NOTE — Assessment & Plan Note (Signed)
Followed by Dr Singh.  Cr has been stable at 1.4-1.8.  Follow.    

## 2013-10-27 NOTE — Assessment & Plan Note (Signed)
Sees Dr Mariah Milling.  Lasix adjusted.   Blood pressure appears to be under good control.  Check metabolic panel.

## 2013-10-27 NOTE — Assessment & Plan Note (Addendum)
Previous vitamin D level wnl. Recheck today.   Unable to take bisphosphonates.  Follow.

## 2013-10-28 ENCOUNTER — Encounter: Payer: Self-pay | Admitting: *Deleted

## 2013-10-29 ENCOUNTER — Encounter: Payer: Self-pay | Admitting: Internal Medicine

## 2013-10-30 ENCOUNTER — Ambulatory Visit: Payer: Self-pay | Admitting: Internal Medicine

## 2013-10-30 ENCOUNTER — Encounter: Payer: Self-pay | Admitting: Internal Medicine

## 2013-11-11 ENCOUNTER — Encounter: Payer: Self-pay | Admitting: Internal Medicine

## 2013-11-23 ENCOUNTER — Other Ambulatory Visit: Payer: Self-pay | Admitting: Internal Medicine

## 2013-11-27 ENCOUNTER — Other Ambulatory Visit: Payer: Self-pay | Admitting: Cardiovascular Disease

## 2013-11-27 ENCOUNTER — Other Ambulatory Visit: Payer: Self-pay | Admitting: *Deleted

## 2013-11-27 MED ORDER — HYDRALAZINE HCL 50 MG PO TABS: 50.0000 mg | ORAL_TABLET | Freq: Three times a day (TID) | ORAL | Status: DC

## 2013-11-27 NOTE — Telephone Encounter (Signed)
Requested Prescriptions   Signed Prescriptions Disp Refills  . hydrALAZINE (APRESOLINE) 50 MG tablet 90 tablet 3    Sig: Take 1 tablet (50 mg total) by mouth 3 (three) times daily.    Authorizing Provider: GOLLAN, TIMOTHY J    Ordering User: LOPEZ, MARINA C   

## 2014-01-12 ENCOUNTER — Other Ambulatory Visit: Payer: Self-pay | Admitting: Cardiovascular Disease

## 2014-01-23 ENCOUNTER — Other Ambulatory Visit: Payer: Self-pay | Admitting: Cardiovascular Disease

## 2014-01-30 ENCOUNTER — Other Ambulatory Visit: Payer: Self-pay | Admitting: Cardiovascular Disease

## 2014-02-21 ENCOUNTER — Other Ambulatory Visit: Payer: Self-pay | Admitting: Internal Medicine

## 2014-02-23 ENCOUNTER — Other Ambulatory Visit: Payer: Self-pay | Admitting: Internal Medicine

## 2014-04-14 ENCOUNTER — Ambulatory Visit (INDEPENDENT_AMBULATORY_CARE_PROVIDER_SITE_OTHER): Payer: 59 | Admitting: Cardiovascular Disease

## 2014-04-14 ENCOUNTER — Encounter: Payer: Self-pay | Admitting: Cardiovascular Disease

## 2014-04-14 VITALS — BP 130/86 | HR 66 | Ht 64.0 in | Wt 153.5 lb

## 2014-04-14 DIAGNOSIS — R609 Edema, unspecified: Secondary | ICD-10-CM

## 2014-04-14 DIAGNOSIS — N183 Chronic kidney disease, stage 3 unspecified: Secondary | ICD-10-CM

## 2014-04-14 DIAGNOSIS — I10 Essential (primary) hypertension: Secondary | ICD-10-CM

## 2014-04-14 DIAGNOSIS — E785 Hyperlipidemia, unspecified: Secondary | ICD-10-CM

## 2014-04-14 DIAGNOSIS — I5032 Chronic diastolic (congestive) heart failure: Secondary | ICD-10-CM

## 2014-04-14 NOTE — Assessment & Plan Note (Signed)
Blood pressure is well controlled on today's visit. No changes made to the medications. 

## 2014-04-14 NOTE — Assessment & Plan Note (Signed)
Cholesterol is at goal on the current lipid regimen. No changes to the medications were made.  

## 2014-04-14 NOTE — Progress Notes (Signed)
[name], female    DOB: [birth year], 80 y.o.   MRN: [id number]  HPI Comments: Ms. Diprima is an 72 to and and yo pleasant woman with history of HTN and diastolic CHF, chronic lower extremity edema (left leg in particular), presents for followup.   She has severe scoliosis and chronic back pain and requires pain medication.   She reports that she was doing very well but continues to be in chronic pain from her back, Also dealing with an eye problem, macular degeneration.  She takes hydralazine 3 times  day, bystolic 5 mg BID with losartan daily. Sometimes she misses her noon dose of hydralazine Blood pressure has been well-controlled  with systolic pressure typically 130-150,  Tries to wear compression hose when she can. Elevated creatinine in November 2014 also with elevated BUN   catheterization in 2003 showing luminal irregularities,  negative stress test in October 2010 with no ischemia, echocardiogram August 2010 showing normal systolic function, mild to moderate tricuspid regurgitation, mildly elevated right ventricular systolic pressures consistent with mild pulmonary hypertension, ejection fraction 60%.  EKG shows normal sinus rhythm with rate 66   beats per minute,  no significant ST or T wave changes. LVH by voltage criteria   Outpatient Encounter Prescriptions as of 04/14/2014  Medication Sig  . aspirin 81 MG EC tablet Take 81 mg by mouth daily.    . B Complex Vitamins (PA B-COMPLEX WITH B-12) TABS Take 1 tablet by mouth daily.    . brimonidine (ALPHAGAN P) 0.1 % SOLN Place 1 drop into the left eye 2 (two) times daily.  Marland Kitchen BYSTOLIC 5 MG tablet TAKE 1 TABLET (5 MG TOTAL) BY MOUTH 2 (TWO) TIMES DAILY.  . BYSTOLIC 5 MG tablet TAKE 1 TABLET  BY MOUTH 2 TIMES DAILY.  . Calcium Carbonate-Vitamin D (CALCIUM-VITAMIN D) 600-200 MG-UNIT CAPS Take by mouth. Takes 1/2 tablet daily.  . dorzolamide-timolol (COSOPT) 22.3-6.8 MG/ML ophthalmic solution Place 1 drop into the  left eye 2 (two) times daily.   . furosemide (LASIX) 40 MG tablet TAKE 1 TABLET (40 MG TOTAL) BY MOUTH DAILY.  Marland Kitchen Glucosamine-Chondroitin 1500-1200 MG/30ML LIQD Take by mouth. Takes 1/2 tablet daily.  . hydrALAZINE (APRESOLINE) 50 MG tablet Take 1 tablet (50 mg total) by mouth 3 (three) times daily.  Marland Kitchen latanoprost (XALATAN) 0.005 % ophthalmic solution Place 1 drop into the left eye at bedtime.   Marland Kitchen losartan (COZAAR) 100 MG tablet TAKE 1 TABLET BY MOUTH DAILY.  . magnesium gluconate (MAGONATE) 500 MG tablet Take 500 mg by mouth daily.    . multivitamin (THERAGRAN) per tablet Take 1 tablet by mouth daily.    Marland Kitchen omeprazole (PRILOSEC OTC) 20 MG tablet Take 20 mg by mouth 2 (two) times daily.  . Polyethylene Glycol 3350 (MIRALAX PO) Take by mouth as needed.   . simvastatin (ZOCOR) 10 MG tablet TAKE 1 TABLET (10 MG TOTAL) BY MOUTH AT BEDTIME.  . traMADol (ULTRAM) 50 MG tablet Take 1 tablet (50 mg total) by mouth every 8 (eight) hours as needed.  . vitamin C (ASCORBIC ACID) 500 MG tablet Take 500 mg by mouth daily.     Review of Systems  Constitutional: Negative.   HENT: Negative.   Eyes: Negative.   Respiratory: Negative.   Cardiovascular: Positive for leg swelling.       Left leg only  Gastrointestinal: Negative.   Endocrine: Negative.   Musculoskeletal: Positive for back pain.  Skin: Negative.   Allergic/Immunologic: Negative.  Neurological: Negative.   Hematological: Negative.   Psychiatric/Behavioral: Negative.   All other systems reviewed and are negative.   BP 130/86  Pulse 66  Ht 5\' 4"  (1.626 m)  Wt 153 lb 8 oz (69.627 kg)  BMI 26.34 kg/m2  Physical Exam  Nursing note and vitals reviewed. Constitutional: She is oriented to person, place, and time. She appears well-developed and well-nourished.  HENT:  Head: Normocephalic.  Nose: Nose normal.  Mouth/Throat: Oropharynx is clear and moist.  Eyes: Conjunctivae are normal. Pupils are equal, round, and reactive to light.  Neck:  Normal range of motion. Neck supple. No JVD present.  Cardiovascular: Normal rate, regular rhythm, S1 normal, S2 normal, normal heart sounds and intact distal pulses.  Exam reveals no gallop and no friction rub.   No murmur heard. Trace to 1 + edema of the left LE around the ankle  Pulmonary/Chest: Effort normal and breath sounds normal. No respiratory distress. She has no wheezes. She has no rales. She exhibits no tenderness.  Abdominal: Soft. Bowel sounds are normal. She exhibits no distension. There is no tenderness.  Musculoskeletal: Normal range of motion. She exhibits no edema and no tenderness.  Severe scoliosis and kyphosis  Lymphadenopathy:    She has no cervical adenopathy.  Neurological: She is alert and oriented to person, place, and time. Coordination normal.  Skin: Skin is warm and dry. No rash noted. No erythema.  Psychiatric: She has a normal mood and affect. Her behavior is normal. Judgment and thought content normal.    Assessment and Plan

## 2014-04-14 NOTE — Assessment & Plan Note (Addendum)
She appears relatively euvolemic. In fact with elevated creatinine and BUN in November 2014, this raises the concern for overdiuresis. Recommended she hold her Lasix next week prior to her visit with Dr. Nicki Reaper and a basic metabolic panel. It would be interesting to observe her renal function when she is hydrated.

## 2014-04-14 NOTE — Assessment & Plan Note (Signed)
Edema predominantly of the left lower extremity around the ankle. She does report having severe ankle injury many years ago. No edema the right leg

## 2014-04-14 NOTE — Patient Instructions (Signed)
You are doing well. No medication changes were made.  Please call us if you have new issues that need to be addressed before your next appt.  Your physician wants you to follow-up in: 12 months.  You will receive a reminder letter in the mail two months in advance. If you don't receive a letter, please call our office to schedule the follow-up appointment. 

## 2014-04-14 NOTE — Assessment & Plan Note (Signed)
Recommended she hold her Lasix for one week prior to her next basic metabolic panel with Dr. Nicki Reaper next week

## 2014-04-18 ENCOUNTER — Other Ambulatory Visit: Payer: Self-pay | Admitting: Cardiovascular Disease

## 2014-04-22 ENCOUNTER — Other Ambulatory Visit: Payer: Self-pay

## 2014-04-22 MED ORDER — NEBIVOLOL HCL 5 MG PO TABS: ORAL_TABLET | ORAL | Status: DC

## 2014-04-22 NOTE — Telephone Encounter (Signed)
Refill sent for bystolic 5 mg

## 2014-04-24 ENCOUNTER — Ambulatory Visit (INDEPENDENT_AMBULATORY_CARE_PROVIDER_SITE_OTHER): Payer: Medicare Other | Admitting: Internal Medicine

## 2014-04-24 ENCOUNTER — Encounter: Payer: Self-pay | Admitting: Internal Medicine

## 2014-04-24 VITALS — BP 148/80 | HR 66 | Temp 98.0°F | Resp 16 | Ht 64.0 in | Wt 153.0 lb

## 2014-04-24 DIAGNOSIS — I10 Essential (primary) hypertension: Secondary | ICD-10-CM

## 2014-04-24 DIAGNOSIS — M810 Age-related osteoporosis without current pathological fracture: Secondary | ICD-10-CM

## 2014-04-24 DIAGNOSIS — E875 Hyperkalemia: Secondary | ICD-10-CM

## 2014-04-24 DIAGNOSIS — C549 Malignant neoplasm of corpus uteri, unspecified: Secondary | ICD-10-CM

## 2014-04-24 DIAGNOSIS — Z23 Encounter for immunization: Secondary | ICD-10-CM

## 2014-04-24 DIAGNOSIS — C541 Malignant neoplasm of endometrium: Secondary | ICD-10-CM

## 2014-04-24 DIAGNOSIS — Z0000 Encounter for general adult medical examination without abnormal findings: Secondary | ICD-10-CM

## 2014-04-24 DIAGNOSIS — N183 Chronic kidney disease, stage 3 unspecified: Secondary | ICD-10-CM

## 2014-04-24 DIAGNOSIS — R609 Edema, unspecified: Secondary | ICD-10-CM

## 2014-04-24 DIAGNOSIS — M549 Dorsalgia, unspecified: Secondary | ICD-10-CM

## 2014-04-24 DIAGNOSIS — K219 Gastro-esophageal reflux disease without esophagitis: Secondary | ICD-10-CM

## 2014-04-24 DIAGNOSIS — G8929 Other chronic pain: Secondary | ICD-10-CM

## 2014-04-24 DIAGNOSIS — E785 Hyperlipidemia, unspecified: Secondary | ICD-10-CM

## 2014-04-24 DIAGNOSIS — I5032 Chronic diastolic (congestive) heart failure: Secondary | ICD-10-CM

## 2014-04-24 LAB — LIPID PANEL
CHOL/HDL RATIO: 3
Cholesterol: 168 mg/dL (ref 0–200)
HDL: 65.8 mg/dL (ref >39.00)
LDL CALC: 80 mg/dL (ref 0–99)
TRIGLYCERIDES: 110 mg/dL (ref 0.0–149.0)
VLDL: 22 mg/dL (ref 0.0–40.0)

## 2014-04-24 LAB — BASIC METABOLIC PANEL
BUN: 27 mg/dL — ABNORMAL HIGH (ref 6–23)
CO2: 23 meq/L (ref 19–32)
CREATININE: 1.6 mg/dL — AB (ref 0.4–1.2)
Calcium: 9.7 mg/dL (ref 8.4–10.5)
Chloride: 107 mEq/L (ref 96–112)
GFR: 33.02 mL/min — ABNORMAL LOW (ref >60.00)
Glucose, Bld: 92 mg/dL (ref 70–99)
POTASSIUM: 4.5 meq/L (ref 3.5–5.1)
Sodium: 139 mEq/L (ref 135–145)

## 2014-04-24 LAB — TSH: TSH: 1.99 u[IU]/mL (ref 0.35–4.50)

## 2014-04-24 LAB — HEPATIC FUNCTION PANEL
ALBUMIN: 4.2 g/dL (ref 3.5–5.2)
ALT: 17 U/L (ref 0–35)
AST: 24 U/L (ref 0–37)
Alkaline Phosphatase: 58 U/L (ref 39–117)
BILIRUBIN TOTAL: 0.7 mg/dL (ref 0.2–1.2)
Bilirubin, Direct: 0 mg/dL (ref 0.0–0.3)
Total Protein: 7.3 g/dL (ref 6.0–8.3)

## 2014-04-24 NOTE — Progress Notes (Signed)
Pre visit review using our clinic review tool, if applicable. No additional management support is needed unless otherwise documented below in the visit note. 

## 2014-04-25 LAB — VARICELLA ZOSTER ANTIBODY, IGG: VARICELLA IGG: 1595 {index} — AB (ref <135.00)

## 2014-04-27 ENCOUNTER — Encounter: Payer: Self-pay | Admitting: Internal Medicine

## 2014-04-27 NOTE — Assessment & Plan Note (Signed)
If she watches her diet, potassium stays wnl.  Follow.

## 2014-04-27 NOTE — Assessment & Plan Note (Signed)
Previous vitamin D level wnl.  Unable to take bisphosphonates.  Follow.

## 2014-04-27 NOTE — Assessment & Plan Note (Signed)
Sees Dr Rockey Situ.  Off lasix.   Blood pressure appears to be under good control.  Check metabolic panel.

## 2014-04-27 NOTE — Progress Notes (Signed)
Subjective:    [name], female    DOB: 12/08/1926, 80 y.o.   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  Has chronic back pain.  Stable.  Sees Dr Rockey Situ.  Everything stable - cardiac. He recently saw her and had her hold her lasix.  She is doing well off the medication.  Seeing opthalmology for her eyes.  S/p injections in her left eye.  Is s/p right cataract surgery.  Has done well.  Seeing Dr Ouida Sills.   States last pap and stool test ok.  No acid reflux.  She had questions about immunizations.       Past Medical History  Diagnosis Date  . S/P cardiac catheterization 05/2002    Luminal irregularities only in the coronaries. EF 55%. There was some irregulatrity in the right renal artery suggestive of possible fibromuscular dysplasia. Myoview in 5/08 showed no ischemia or infarction. Lexiscan myoview at [facility] (10/10) showed EF 55%, normal wall motion, no evience ofor ischemia or infarction.  . Diastolic CHF, acute     Echo (8/10) with EF 60-65%, mild LVH, mild to moderate TR, PASP 40 mmHg.  Marland Kitchen HTN (hypertension)   . Dizziness     Thought to be side effect of Norvasc  . Hypercalcemia     On HCTZ  . Scoliosis associated with other condition     Chronic back pain  . GERD (gastroesophageal reflux disease)   . Uterine cancer     S/P hypsterectomy in 2008  . Hyperkalemia   . Macular degeneration   . Renal insufficiency   . Hypercholesterolemia   . Osteoporosis   . Renal fibromuscular dysplasia   . Glaucoma     Current Outpatient Prescriptions on File Prior to Visit  Medication Sig Dispense Refill  . aspirin 81 MG EC tablet Take 81 mg by mouth daily.        . B Complex Vitamins (PA B-COMPLEX WITH B-12) TABS Take 1 tablet by mouth daily.        .  brimonidine (ALPHAGAN P) 0.1 % SOLN Place 1 drop into the left eye 2 (two) times daily.      . Calcium Carbonate-Vitamin D (CALCIUM-VITAMIN D) 600-200 MG-UNIT CAPS Take by mouth. Takes 1/2 tablet daily.      . dorzolamide-timolol (COSOPT) 22.3-6.8 MG/ML ophthalmic solution Place 1 drop into the left eye 2 (two) times daily.       . furosemide (LASIX) 40 MG tablet TAKE 1 TABLET (40 MG TOTAL) BY MOUTH DAILY.  90 tablet  10  . Glucosamine-Chondroitin 1500-1200 MG/30ML LIQD Take by mouth. Takes 1/2 tablet daily.      . hydrALAZINE (APRESOLINE) 50 MG tablet Take 1 tablet (50 mg total) by mouth 3 (three) times daily.  90 tablet  3  . latanoprost (XALATAN) 0.005 % ophthalmic solution Place 1 drop into the left eye at bedtime.       Marland Kitchen losartan (COZAAR) 100 MG tablet TAKE 1 TABLET BY MOUTH DAILY.  30 tablet  3  . magnesium gluconate (MAGONATE) 500 MG tablet Take 500 mg by mouth daily.        . multivitamin (THERAGRAN) per tablet Take 1 tablet by mouth daily.        . nebivolol (BYSTOLIC) 5 MG tablet TAKE 1 TABLET  BY  MOUTH 2 TIMES DAILY.  60 tablet  6  . omeprazole (PRILOSEC OTC) 20 MG tablet Take 20 mg by mouth 2 (two) times daily.      . Polyethylene Glycol 3350 (MIRALAX PO) Take by mouth as needed.       . simvastatin (ZOCOR) 10 MG tablet TAKE 1 TABLET (10 MG TOTAL) BY MOUTH AT BEDTIME.  90 tablet  1  . traMADol (ULTRAM) 50 MG tablet Take 1 tablet (50 mg total) by mouth every 8 (eight) hours as needed.  30 tablet  1  . vitamin C (ASCORBIC ACID) 500 MG tablet Take 500 mg by mouth daily.         No current facility-administered medications on file prior to visit.    Review of Systems Patient denies any headache, lightheadedness or dizziness.  No sinus or allergy symptoms.  No chest pain, tightness or palpitations.  No increased shortness of breath, cough or congestion.  No nausea or vomiting.  No abdominal pain or cramping.  No bowel change, such as diarrhea, constipation, BRBPR or melana.  No urine  change.  Still with back pain as outlined.  Stable.  No acid reflux.  Controlled.   Following with opthalmology for her eyes.  Off lasix since last week.       Objective:   Physical Exam  Filed Vitals:   04/24/14 0903  BP: 148/80  Pulse: 66  Temp: 98 F (36.7 C)  Resp: 87   80 year old female in no acute distress.   HEENT:  Nares- clear.  Oropharynx - without lesions. NECK:  Supple.  Nontender.  No audible bruit.  HEART:  Appears to be regular. LUNGS:  No crackles or wheezing audible.  Respirations even and unlabored.  RADIAL PULSE:  Equal bilaterally. ABDOMEN:  Soft, nontender.  Bowel sounds present and normal.  No audible abdominal bruit.  EXTREMITIES:  No increased edema present.  DP pulses palpable and equal bilaterally.           Assessment & Plan:  CARDIOVASCULAR.  Sees Dr Rockey Situ.  09/16/09 stress test revealed normal LV function with no evidence of ischemia.  Just evaluated.  Stable.  Off lasix.    GI.  Colonoscopy 06/04/10 revealed marked tortuosity.  90% of the colon was evaluated.  Did not identify the cecum. No polyps.  Did have diverticulosis.  Stable.    ABDOMINAL BRUIT.  Previous aortic ultrasound negative for aneurysm.  Follow.   HISTORY OF RENAL CYSTS.  Followed by nephrology.   HEALTH MAINTENANCE.  Physical last visit.  Gets her pelvics and paps through GYN (Dr Pharmacist, community.   GI as above.  Mammogram 10/30/13 - BiRADS I.  Prevnar given today.  Check varicella titer.    I spent 25 minutes with the patient and more than 50% of the time was spent in consultation regarding the above.

## 2014-04-27 NOTE — Assessment & Plan Note (Signed)
Has known scoliosis.  Has seen surgery.  Not a surgical candidate.  Unable to take narcotic pain meds, antiinflammatories.  Dr Candiss Norse started her on ultram.  She is tolerating now.  Uses tylenol prn.  Stable.

## 2014-04-27 NOTE — Assessment & Plan Note (Signed)
Followed by Dr Candiss Norse.  Cr has been stable at 1.4-1.8.  Follow.

## 2014-04-27 NOTE — Assessment & Plan Note (Signed)
Controlled on current regimen.  Follow.  

## 2014-04-27 NOTE — Assessment & Plan Note (Signed)
Stable.  Continue support hose.

## 2014-04-27 NOTE — Assessment & Plan Note (Signed)
Followed by Dr Ouida Sills.  Last Pap ok per pt report.

## 2014-04-27 NOTE — Assessment & Plan Note (Signed)
Remains on simvastatin.  Low cholesterol diet.  Check lipid panel and liver function with next labs.

## 2014-04-28 ENCOUNTER — Encounter: Payer: Self-pay | Admitting: Internal Medicine

## 2014-05-08 ENCOUNTER — Other Ambulatory Visit: Payer: Self-pay | Admitting: *Deleted

## 2014-05-08 MED ORDER — TRAMADOL HCL 50 MG PO TABS: 50.0000 mg | ORAL_TABLET | Freq: Two times a day (BID) | ORAL | Status: DC | PRN

## 2014-05-08 NOTE — Telephone Encounter (Signed)
Ok refill? 

## 2014-05-08 NOTE — Telephone Encounter (Signed)
Refilled tramadol #30 with one refill.  rx signed and on your desk.

## 2014-05-08 NOTE — Telephone Encounter (Signed)
Rx faxed to pharmacy  

## 2014-05-27 ENCOUNTER — Telehealth: Payer: Self-pay | Admitting: Internal Medicine

## 2014-05-27 NOTE — Telephone Encounter (Signed)
Patient Information:  Caller Name: [name]  Phone: [phone]  Patient: Jill Castro, Jill Castro  Gender: Female  DOB: 1927/06/06  Age: 80 Years  PCP: Einar Pheasant  Office Follow Up:  Does the office need to follow up with this patient?: No  Instructions For The Office: N/A  RN Note:  Patient states she was bitten by an insect on her left forearm 05/25/14. Patient states there was a spider "carcass" found near the bed. Patient states area of redness has become progressively larger. States blistered area developed in the center 05/27/14. States blistered area is approx. the size of a pencil eraser with surrounding redness approx. 2 inches in diameter. States area is itchy. Denies pain. Afebrile. Care advice given per guidelines. Call back parameters reviewed. Patient verbalizes understanding.  Symptoms  Reason For Call & Symptoms: Insect bite left forearm  Reviewed Health History In EMR: Yes  Reviewed Medications In EMR: Yes  Reviewed Allergies In EMR: Yes  Reviewed Surgeries / Procedures: Yes  Date of Onset of Symptoms: 05/25/2014  Treatments Tried: Hydrocortisone Cream  Treatments Tried Worked: No  Guideline(s) Used:  Pension scheme manager  Disposition Per Guideline:   See Within 3 Days in Office  Reason For Disposition Reached:   Bite starts to look bad (e.g., blister, purplish skin, ulcer)  Advice Given:  Local Treatment - Itchy Insect Bites  If the itch is severe, use 1% hydrocortisone cream. Apply 4 times a day until the itch is less severe, then switch to calamine lotion.  Call Back If:  You become worse.  Call Back If:  Abdominal pains or muscle spasms occur  Bite begins to look infected  You become worse  RN Overrode Recommendation:  Go To [facility]  No appts. available. Office is closing soon.

## 2014-05-27 NOTE — Telephone Encounter (Signed)
Noted.  Make sure pt was evaluated and doing ok.   Thanks.

## 2014-05-27 NOTE — Telephone Encounter (Signed)
Pt advised to go to [facility], FYI

## 2014-05-29 NOTE — Telephone Encounter (Signed)
Noted  

## 2014-05-29 NOTE — Telephone Encounter (Signed)
Spoke with spouse & he stated that she went to [facility] walk in & she was treated for a possible insect bite. Medication is really making a difference.

## 2014-06-02 ENCOUNTER — Other Ambulatory Visit: Payer: Self-pay | Admitting: Cardiovascular Disease

## 2014-07-09 ENCOUNTER — Encounter: Payer: Self-pay | Admitting: *Deleted

## 2014-07-09 DIAGNOSIS — M549 Dorsalgia, unspecified: Secondary | ICD-10-CM

## 2014-07-09 NOTE — Telephone Encounter (Signed)
Order placed for referral.  My chart message sent to pt.

## 2014-07-14 ENCOUNTER — Other Ambulatory Visit: Payer: Self-pay | Admitting: Cardiovascular Disease

## 2014-08-16 ENCOUNTER — Other Ambulatory Visit: Payer: Self-pay | Admitting: Internal Medicine

## 2014-08-23 ENCOUNTER — Other Ambulatory Visit: Payer: Self-pay | Admitting: Internal Medicine

## 2014-08-26 ENCOUNTER — Other Ambulatory Visit: Payer: Self-pay | Admitting: *Deleted

## 2014-08-26 MED ORDER — TRAMADOL HCL 50 MG PO TABS: 50.0000 mg | ORAL_TABLET | Freq: Two times a day (BID) | ORAL | Status: DC | PRN

## 2014-08-26 NOTE — Telephone Encounter (Signed)
Refill

## 2014-08-26 NOTE — Telephone Encounter (Signed)
Refilled tramadol #30 with one refill.  rx signed.

## 2014-08-26 NOTE — Telephone Encounter (Signed)
Faxed to [facility]	[place]

## 2014-08-28 ENCOUNTER — Other Ambulatory Visit: Payer: Self-pay | Admitting: Cardiovascular Disease

## 2014-10-27 ENCOUNTER — Encounter: Payer: Self-pay | Admitting: Internal Medicine

## 2014-10-27 ENCOUNTER — Telehealth: Payer: Self-pay | Admitting: *Deleted

## 2014-10-27 ENCOUNTER — Ambulatory Visit (INDEPENDENT_AMBULATORY_CARE_PROVIDER_SITE_OTHER): Payer: Medicare Other | Admitting: Internal Medicine

## 2014-10-27 VITALS — BP 120/80 | HR 66 | Temp 97.9°F | Ht 63.0 in | Wt 153.5 lb

## 2014-10-27 DIAGNOSIS — Z0000 Encounter for general adult medical examination without abnormal findings: Secondary | ICD-10-CM

## 2014-10-27 DIAGNOSIS — J3489 Other specified disorders of nose and nasal sinuses: Secondary | ICD-10-CM | POA: Insufficient documentation

## 2014-10-27 DIAGNOSIS — G8929 Other chronic pain: Secondary | ICD-10-CM

## 2014-10-27 DIAGNOSIS — C541 Malignant neoplasm of endometrium: Secondary | ICD-10-CM

## 2014-10-27 DIAGNOSIS — K2100 Gastro-esophageal reflux disease with esophagitis, without bleeding: Secondary | ICD-10-CM

## 2014-10-27 DIAGNOSIS — M549 Dorsalgia, unspecified: Secondary | ICD-10-CM

## 2014-10-27 DIAGNOSIS — I10 Essential (primary) hypertension: Secondary | ICD-10-CM

## 2014-10-27 DIAGNOSIS — N183 Chronic kidney disease, stage 3 unspecified: Secondary | ICD-10-CM

## 2014-10-27 DIAGNOSIS — E7800 Pure hypercholesterolemia, unspecified: Secondary | ICD-10-CM

## 2014-10-27 DIAGNOSIS — E875 Hyperkalemia: Secondary | ICD-10-CM

## 2014-10-27 NOTE — Progress Notes (Signed)
Subjective:    [name], female    DOB: 1927/06/06, 80 y.o.   MRN: [id number]  HPI The patient is here for annual Medicare wellness examination and management of other chronic and acute problems.   The risk factors are reflected in the social history.  She denies any tobacco use, alcohol use or illicit drug use.   The roster of all physicians providing medical care to patient - Dr Dorthula Nettles [facility]) Dr Toy Cookey (dentist) Dr Ouida Sills (gyn) Dr Rockey Situ (cardiology)  Activities of daily living:  The patient is 100% independent in all ADLs: dressing, toileting, feeding as well as independent mobility  Home safety : The patient has smoke detectors in the home. She wears seatbelts. There is no violence in the home.   There is no risks for hepatitis, STDs or HIV. There is no history of blood transfusion. She has no travel history to infectious disease endemic areas of the world.  The patient sees her dentist regularly.  Due to f/u next week. She has seen her eye doctor in the last month. She admits to hearing difficulty and has been evaluated by ENT.  Has hearing aids.  She does not  have excessive sun exposure.   Diet: the importance of a healthy diet is discussed. She tries to follow a low cholesterol/low potassium diet.    The benefits of regular aerobic exercise were discussed. She does not do regular exercise.  Is limited by her back.    Depression screen: there are no signs or vegative symptoms of depression- irritability, change in appetite, anhedonia, sadness/tearfullness.  Cognitive assessment: the patient manages all their financial and personal affairs and is actively engaged. They could relate day,date,year and events; recalled 1/3 objects at 3 minutes; performed clock-face test normally.  The following portions of the patient's history were reviewed and updated as appropriate: allergies, current medications, past family history, past medical  history,  past surgical history, past social history  and problem list.  Visual acuity was assessed - right 20/40, left 20/30 and bilater 20/20.  Body mass index was assessed and reviewed.   No recent falls.    During the course of the visit the patient was educated and counseled about appropriate screening and preventive services including : fall prevention , diabetes screening, nutrition counseling, colorectal cancer screening, and recommended immunizations.     Review of Systems Patient denies any headache, lightheadedness or dizziness.  Does report some cold symptoms.  Some hoarseness.  Increased drainage.  Husband has been sick.  No chest pain, tightness or palpitations.  No increased shortness of breath, cough or congestion.  No nausea or vomiting.  No acid reflux.   Controlled on prilosec.  No abdominal pain or cramping.  No bowel change, such as diarrhea, constipation, BRBPR or melana.  No urine change.   Still with back pain.  Is no longer taking tramadol.  Taking tylenol.  Blood pressure overall has been under reasonable control.  Elevated recently.  Taking her hydralazine everyday at lunch.  Blood pressure better.  Has not followed up with Dr Ouida Sills.  Did see Dr Tamala Julian for some rectal irritation.  Better now.       Objective:   Physical Exam Filed Vitals:   10/27/14 0830  BP: 120/80  Pulse: 66  Temp: 97.9 F (36.6 C)   Blood pressure recheck:  42/42  80 year old female in no acute distress.   HEENT:  Nares- clear.  Oropharynx - without  lesions. NECK:  Supple.  Nontender.  No audible bruit.  HEART:  Appears to be regular. LUNGS:  No crackles or wheezing audible.  Respirations even and unlabored.  RADIAL PULSE:  Equal bilaterally.    BREASTS:  No nipple discharge or nipple retraction present.  Could not appreciate any distinct nodules or axillary adenopathy.  ABDOMEN:  Soft, nontender.  Bowel sounds present and normal.  No audible abdominal bruit.  GU:  Not performed.     EXTREMITIES:  No increased edema present.  DP pulses palpable and equal bilaterally.          Assessment & Plan:  This is a routine wellness examination for this patient.  I reviewed all health maintenance protocols including mammography, colonoscopy, bone density.  Needed referrals placed. Age and diagnosis appropriate screening labs were ordered.  Her immunization history was reviewed.  Plans to get her shingles vaccine.  Does not feel she needs tetanus.  Declines.   Her current medications and allergies were reviewed and needed refills of her chronic medications were ordered. The plan for yearly health maintenance was discussed.     MEDICARE ATTESTATION I have personally reviewed:  The patient's medical and social history. The use of alcohol, tobacco and illicit drugs. The current medications and supplements. The patient's function ability including ADLs, fall risks, home safety risks, cognitive, and hearing and visual impairment.   Diet and physical activities. Evaluation for depression and mood disorders.    The patient's weight, height, BMI and visual acuity have been recorded in the chart.  I have made referrals, counseled and provided education to the patient based on review of the above.   Other problems addressed: Essential hypertension Blood pressure has been under reasonable control.  Elevated some recently.  Taking her medications regularly and on scheduled times.  Appears to be doing better.  Follow.  - Basic metabolic panel; Future  Gastroesophageal reflux disease with esophagitis Controlled on prilosec.  Follow.    Chronic kidney disease (CKD), stage III (moderate) Has been stable.  Check met b.  Continue f/u with nephrology.  Endometrial cancer Overdue f/u with Dr Ouida Sills.    Hypercholesterolemia Low cholesterol diet and exercise.  On simvastatin.   - Lipid panel; Future - Hepatic function panel; Future  Hyperkalemia Potassium has been wnl..  Follow.     Chronic back pain On tylenol.  Stable.  Desires no further intervention.    Drainage from nose Saline nasal spray.  nasacort nasal spray.  Follow.  Notify me if persistent.    HEALTH MAINTENANCE.  Physical today.  Gets her pelvics and paps through GYN.  Overdue appt.  Schedule.  Mammogram 10/30/13 -  Birads I.  Declines mammogram this year.

## 2014-10-27 NOTE — Telephone Encounter (Signed)
Pt is coming in tomorrow what labs and dx?  

## 2014-10-27 NOTE — Progress Notes (Signed)
Pre visit review using our clinic review tool, if applicable. No additional management support is needed unless otherwise documented below in the visit note. 

## 2014-10-27 NOTE — Telephone Encounter (Signed)
Order placed for labs.

## 2014-10-27 NOTE — Patient Instructions (Signed)
Saline nasal spray - flush nose at least 2-3x/day  Nasacort nasal spray - 2 sprays each nostril one time per day.  Do this in the evening.    Robitussin twice a day as needed.

## 2014-10-28 ENCOUNTER — Other Ambulatory Visit (INDEPENDENT_AMBULATORY_CARE_PROVIDER_SITE_OTHER): Payer: Medicare Other

## 2014-10-28 ENCOUNTER — Encounter: Payer: Self-pay | Admitting: Internal Medicine

## 2014-10-28 DIAGNOSIS — I10 Essential (primary) hypertension: Secondary | ICD-10-CM

## 2014-10-28 DIAGNOSIS — E7800 Pure hypercholesterolemia, unspecified: Secondary | ICD-10-CM

## 2014-10-28 DIAGNOSIS — N183 Chronic kidney disease, stage 3 unspecified: Secondary | ICD-10-CM

## 2014-10-28 LAB — HEPATIC FUNCTION PANEL
ALBUMIN: 4.1 g/dL (ref 3.5–5.2)
ALT: 18 U/L (ref 0–35)
AST: 24 U/L (ref 0–37)
Alkaline Phosphatase: 61 U/L (ref 39–117)
BILIRUBIN DIRECT: 0.1 mg/dL (ref 0.0–0.3)
Total Bilirubin: 0.6 mg/dL (ref 0.2–1.2)
Total Protein: 6.9 g/dL (ref 6.0–8.3)

## 2014-10-28 LAB — LIPID PANEL
CHOL/HDL RATIO: 3
Cholesterol: 170 mg/dL (ref 0–200)
HDL: 66.3 mg/dL (ref >39.00)
LDL Cholesterol: 81 mg/dL (ref 0–99)
NONHDL: 103.7
Triglycerides: 113 mg/dL (ref 0.0–149.0)
VLDL: 22.6 mg/dL (ref 0.0–40.0)

## 2014-10-28 LAB — CBC WITH DIFFERENTIAL/PLATELET
BASOS PCT: 0.5 % (ref 0.0–3.0)
Basophils Absolute: 0 10*3/uL (ref 0.0–0.1)
EOS ABS: 0.3 10*3/uL (ref 0.0–0.7)
Eosinophils Relative: 4.3 % (ref 0.0–5.0)
HEMATOCRIT: 37.7 % (ref 36.0–46.0)
HEMOGLOBIN: 12.4 g/dL (ref 12.0–15.0)
LYMPHS ABS: 2.1 10*3/uL (ref 0.7–4.0)
Lymphocytes Relative: 30.8 % (ref 12.0–46.0)
MCHC: 32.8 g/dL (ref 30.0–36.0)
MCV: 97.9 fl (ref 78.0–100.0)
Monocytes Absolute: 0.7 10*3/uL (ref 0.1–1.0)
Monocytes Relative: 10.2 % (ref 3.0–12.0)
NEUTROS ABS: 3.7 10*3/uL (ref 1.4–7.7)
Neutrophils Relative %: 54.2 % (ref 43.0–77.0)
Platelets: 215 10*3/uL (ref 150.0–400.0)
RBC: 3.85 Mil/uL — ABNORMAL LOW (ref 3.87–5.11)
RDW: 12.8 % (ref 11.5–15.5)
WBC: 6.8 10*3/uL (ref 4.0–10.5)

## 2014-10-28 LAB — BASIC METABOLIC PANEL
BUN: 29 mg/dL — ABNORMAL HIGH (ref 6–23)
CHLORIDE: 109 meq/L (ref 96–112)
CO2: 22 mEq/L (ref 19–32)
CREATININE: 1.4 mg/dL — AB (ref 0.4–1.2)
Calcium: 9.8 mg/dL (ref 8.4–10.5)
GFR: 38.88 mL/min — ABNORMAL LOW (ref >60.00)
Glucose, Bld: 97 mg/dL (ref 70–99)
Potassium: 4.6 mEq/L (ref 3.5–5.1)
Sodium: 138 mEq/L (ref 135–145)

## 2014-10-30 NOTE — Telephone Encounter (Signed)
Unread mychart message mailed to patient 

## 2014-11-18 ENCOUNTER — Ambulatory Visit (INDEPENDENT_AMBULATORY_CARE_PROVIDER_SITE_OTHER): Payer: Medicare Other | Admitting: Cardiovascular Disease

## 2014-11-18 ENCOUNTER — Encounter: Payer: Self-pay | Admitting: Cardiovascular Disease

## 2014-11-18 VITALS — BP 150/80 | HR 67 | Ht 64.0 in | Wt 154.5 lb

## 2014-11-18 DIAGNOSIS — I5032 Chronic diastolic (congestive) heart failure: Secondary | ICD-10-CM

## 2014-11-18 DIAGNOSIS — N183 Chronic kidney disease, stage 3 unspecified: Secondary | ICD-10-CM

## 2014-11-18 DIAGNOSIS — E7800 Pure hypercholesterolemia, unspecified: Secondary | ICD-10-CM

## 2014-11-18 DIAGNOSIS — I159 Secondary hypertension, unspecified: Secondary | ICD-10-CM

## 2014-11-18 DIAGNOSIS — R609 Edema, unspecified: Secondary | ICD-10-CM

## 2014-11-18 MED ORDER — HYDRALAZINE HCL 100 MG PO TABS: 100.0000 mg | ORAL_TABLET | Freq: Four times a day (QID) | ORAL | Status: DC

## 2014-11-18 NOTE — Assessment & Plan Note (Signed)
Blood pressure running high. We will increase hydralazine up to 100 mg 3 times a day Other option would be to increase bystolic up to 10 mg twice a day

## 2014-11-18 NOTE — Assessment & Plan Note (Signed)
Stable creatinine 1.4, Lasix when necessary

## 2014-11-18 NOTE — Assessment & Plan Note (Signed)
Encouraged her to stay on her simvastatin 10 mg daily

## 2014-11-18 NOTE — Patient Instructions (Addendum)
You are doing well. Please increase the hydralazine up to 100 mg 3 to 4 times a day as tolerated  Please monitor your blood pressure  Please call us if you have new issues that need to be addressed before your next appt.  Your physician wants you to follow-up in: 6 months.  You will receive a reminder letter in the mail two months in advance. If you don't receive a letter, please call our office to schedule the follow-up appointment.

## 2014-11-18 NOTE — Assessment & Plan Note (Signed)
Suggested she continue on her Lasix when necessary for worsening leg edema or shortness of breath. Stable creatinine 1.4

## 2014-11-18 NOTE — Progress Notes (Signed)
[name], female    DOB: [birth year], 80 y.o.   MRN: [id number]  HPI Comments: Jill Castro is an 80 yo pleasant woman with history of HTN and diastolic CHF, chronic lower extremity edema (left leg in particular), presents for followup of her hypertension   She has severe scoliosis and chronic back pain and requires pain medication.   She reports back pain has been worse recently. She's not a candidate for back surgery Continued eye problem, macular degeneration.  Blood pressure running high recently. She is taking hydralazine 4 times per day,  bystolic 5 mg BID with losartan daily.  She has been taking less Lasix. Creatinine 1.4, slightly improved from prior numbers. Husband reports weight is up slightly. She denies any shortness of breath or worsening leg swelling Systolic pressure 536 up to 160 on a regular basis Tries to wear compression hose when she can.  EKG shows normal sinus rhythm with rate 66 bpm, no significant ST or T-wave changes   catheterization in 2003 showing luminal irregularities,  negative stress test in October 2010 with no ischemia, echocardiogram August 2010 showing normal systolic function, mild to moderate tricuspid regurgitation, mildly elevated right ventricular systolic pressures consistent with mild pulmonary hypertension, ejection fraction 60%.  Allergies  Allergen Reactions  . Benicar [Olmesartan] Other (See Comments)    Lip swelling  . Celebrex [Celecoxib]   . Penicillins     Outpatient Encounter Prescriptions as of 78/22/2015  Medication Sig  . aspirin 81 MG EC tablet Take 81 mg by mouth daily.    . B Complex Vitamins (PA B-COMPLEX WITH B-12) TABS Take 1 tablet by mouth daily.    . brimonidine (ALPHAGAN P) 0.1 % SOLN Place 1 drop into the left eye 2 (two) times daily.  Marland Kitchen BYSTOLIC 5 MG tablet TAKE 1 TABLET  BY MOUTH 2 TIMES DAILY.  . Calcium Carbonate-Vitamin D (CALCIUM-VITAMIN D) 600-200 MG-UNIT CAPS Take by mouth. Takes 1/2  tablet daily.  . dorzolamide-timolol (COSOPT) 22.3-6.8 MG/ML ophthalmic solution Place 1 drop into the left eye 2 (two) times daily.   . furosemide (LASIX) 40 MG tablet TAKE 1 TABLET (40 MG TOTAL) BY MOUTH DAILY.  Marland Kitchen Glucosamine-Chondroitin 1500-1200 MG/30ML LIQD Take by mouth. Takes 1/2 tablet daily.  . hydrALAZINE (APRESOLINE) 50 MG tablet Take 1 tablet (50 mg total) by mouth 3 (three) times daily.  Marland Kitchen latanoprost (XALATAN) 0.005 % ophthalmic solution Place 1 drop into the left eye at bedtime.   Marland Kitchen losartan (COZAAR) 100 MG tablet TAKE 1 TABLET BY MOUTH DAILY.  . magnesium gluconate (MAGONATE) 500 MG tablet Take 500 mg by mouth daily.    . multivitamin (THERAGRAN) per tablet Take 1 tablet by mouth daily.    Marland Kitchen omeprazole (PRILOSEC) 20 MG capsule TAKE 1 CAPSULE  (20 MG TOTAL) BY MOUTH DAILY.  . Polyethylene Glycol 3350 (MIRALAX PO) Take by mouth as needed.   . simvastatin (ZOCOR) 10 MG tablet TAKE 1 TABLET (10 MG TOTAL) BY MOUTH AT BEDTIME.  . vitamin C (ASCORBIC ACID) 500 MG tablet Take 500 mg by mouth daily.    . [DISCONTINUED] traMADol (ULTRAM) 50 MG tablet Take 1 tablet (50 mg total) by mouth 2 (two) times daily as needed. (Patient not taking: Reported on 11/18/2014)    Past Medical History  Diagnosis Date  . S/P cardiac catheterization 05/2002    Luminal irregularities only in the coronaries. EF 55%. There was some irregulatrity in the right renal artery suggestive of possible fibromuscular dysplasia.  Myoview in 5/08 showed no ischemia or infarction. Lexiscan myoview at [facility] (10/10) showed EF 55%, normal wall motion, no evience ofor ischemia or infarction.  . Diastolic CHF, acute     Echo (8/10) with EF 60-65%, mild LVH, mild to moderate TR, PASP 40 mmHg.  Marland Kitchen HTN (hypertension)   . Dizziness     Thought to be side effect of Norvasc  . Hypercalcemia     On HCTZ  . Scoliosis associated with other condition     Chronic back pain  . GERD (gastroesophageal reflux disease)   . Uterine cancer      S/P hypsterectomy in 2008  . Hyperkalemia   . Macular degeneration   . Renal insufficiency   . Hypercholesterolemia   . Osteoporosis   . Renal fibromuscular dysplasia   . Glaucoma     Past Surgical History  Procedure Laterality Date  . Cardiac catheterization      Left  . Refractive surgery    . Total abdominal hysterectomy w/ bilateral salpingoophorectomy      stage I C well differentiated adenocarcinoma  . Cataract extraction Right     Social History  reports that she has never smoked. She has never used smokeless tobacco. She reports that she does not drink alcohol or use illicit drugs.  Family History family history includes Diabetes in her cousin; Heart disease in her father and mother; Parkinson's disease in her mother.    Review of Systems  Constitutional: Negative.   Eyes: Negative.   Respiratory: Negative.   Cardiovascular: Positive for leg swelling.       Left leg only  Gastrointestinal: Negative.   Musculoskeletal: Positive for back pain.  Allergic/Immunologic: Negative.   Neurological: Negative.   Hematological: Negative.   Psychiatric/Behavioral: Negative.   All other systems reviewed and are negative.   BP 150/80 mmHg  Pulse 67  Ht 5\' 4"  (1.626 m)  Wt 154 lb 8 oz (70.081 kg)  BMI 26.51 kg/m2  Physical Exam  Constitutional: She is oriented to person, place, and time. She appears well-developed and well-nourished.  Frail  HENT:  Head: Normocephalic.  Nose: Nose normal.  Mouth/Throat: Oropharynx is clear and moist.  Eyes: Conjunctivae are normal. Pupils are equal, round, and reactive to light.  Neck: Normal range of motion. Neck supple. No JVD present.  Cardiovascular: Normal rate, regular rhythm, S1 normal, S2 normal, normal heart sounds and intact distal pulses.  Exam reveals no gallop and no friction rub.   No murmur heard. Nonpitting edema to below the knee, compression hose in place  Pulmonary/Chest: Effort normal and breath sounds  normal. No respiratory distress. She has no wheezes. She has no rales. She exhibits no tenderness.  Abdominal: Soft. Bowel sounds are normal. She exhibits no distension. There is no tenderness.  Musculoskeletal: Normal range of motion. She exhibits no edema or tenderness.  Lymphadenopathy:    She has no cervical adenopathy.  Neurological: She is alert and oriented to person, place, and time. Coordination normal.  Skin: Skin is warm and dry. No rash noted. No erythema.  Psychiatric: She has a normal mood and affect. Her behavior is normal. Judgment and thought content normal.    Assessment and Plan  Nursing note and vitals reviewed.

## 2014-11-18 NOTE — Assessment & Plan Note (Signed)
Current nonpitting edema unrelated to cardiac issue, likely venous insufficiency. Recommended she continue with compression hose

## 2014-12-08 ENCOUNTER — Telehealth: Payer: Self-pay

## 2014-12-08 NOTE — Telephone Encounter (Signed)
Pt spouse called states her BP 163, and hasnt gotten any lower than 160, currently 178/91 HR 67, pt states she is weak and "shakey".   Temperature is normal.

## 2014-12-08 NOTE — Telephone Encounter (Signed)
Could take additional half hydralazine pills as needed Could even take extra bystolic 5 mg If blood pressure continues to run high, may need to add a alternate medication on a daily basis Don't know why she is more unsteady, shaky. Perhaps this is contributing to the blood pressure

## 2014-12-08 NOTE — Telephone Encounter (Signed)
Spoke w/ pt's husband.  He reports that pt's BP is usually high in the 858I systolic in the am, but will come down after taking her meds. Reports that it was 160 on waking, but has gone up to 178/91. Reports that she took all of her meds this am already.  Pt reports chronic back pain, but states that this is not more than usual.  States that pt's wt has not increased, she has no fever, her pulse is regular and she denies chest pain or SOB.  Advised him that pain and stress can affect BP and that we will need more readings before adjusting her meds. Advised him to have pt relax, let her meds work and call this afternoon if BP remains elevated.  He verbalizes understanding and will call back w/ any further questions or concerns.

## 2014-12-08 NOTE — Telephone Encounter (Signed)
Spoke w/ pt's husband.  He reports that pt took her meds, laid down and relaxed.  She took a hydralazine at noon, at 1:30 BP 166/82.  Pt denies CP, SOB, n/v or HA. She denies any recent cold sx. Reports dizziness on changing positions, unsteady gait, shakiness and husband states that she was clammy this am.  Reports that they have not spoken w/ PCP, as she is concerned that sx are r/t BP.  He would like to know Dr. Donivan Scull recommendation.

## 2014-12-09 NOTE — Telephone Encounter (Signed)
Spoke w/ pt's husband.  Advised him of Dr. Donivan Scull recommendation.  He verbalizes understanding.   He reports that pt felt better last night, is still a bit shaky this am, but BP is down to 924M systolic.  He states that he will call pt's PCP if she is not feeling better today, as he thinks sx may be r/t inner ear problem.  Asked him to call back if we can be of further assistance.

## 2015-01-07 ENCOUNTER — Other Ambulatory Visit: Payer: Self-pay

## 2015-01-09 ENCOUNTER — Other Ambulatory Visit: Payer: Self-pay | Admitting: *Deleted

## 2015-01-09 ENCOUNTER — Other Ambulatory Visit (INDEPENDENT_AMBULATORY_CARE_PROVIDER_SITE_OTHER): Payer: Medicare Other

## 2015-01-09 ENCOUNTER — Other Ambulatory Visit: Payer: Self-pay | Admitting: Internal Medicine

## 2015-01-09 DIAGNOSIS — Z1211 Encounter for screening for malignant neoplasm of colon: Secondary | ICD-10-CM | POA: Diagnosis not present

## 2015-01-09 LAB — FECAL OCCULT BLOOD, IMMUNOCHEMICAL: Fecal Occult Bld: NEGATIVE

## 2015-01-12 ENCOUNTER — Encounter: Payer: Self-pay | Admitting: *Deleted

## 2015-02-02 ENCOUNTER — Telehealth: Payer: Self-pay | Admitting: *Deleted

## 2015-02-02 NOTE — Telephone Encounter (Signed)
Pt husband is returning the call. Pt husband requests to speak slowly when returning the call.

## 2015-02-02 NOTE — Telephone Encounter (Signed)
Spoke w/ pt's husband.  He states that he called the office to sched a recall visit as he received a letter in the mail.  He reports that pt became SOB on Sunday while walking and that "we didn't call 911 or go the emergency room", as sx resolved w/ rest. He states that pt was recently told that she has a "touch of COPD" and that "every time we see Dr. Rockey Situ, he asks about her breathing". He denies chest pain, wt gain, ankle edema, or more SOB on lying down.  Advised pt to contact PCP, as sx do not sound cardiac related and PCP can order more testing than we can.  He verbalizes understanding and will call back w/ any further questions or concerns.

## 2015-02-02 NOTE — Telephone Encounter (Signed)
Pt c/o Shortness Of Breath: STAT if SOB developed within the last 24 hours or pt is noticeably SOB on the phone  1. Are you currently SOB (can you hear that pt is SOB on the phone)? Can't talking to husband   2. How long have you been experiencing SOB? Only when she walks, yesterday  3. Are you SOB when sitting or when up moving around? walking  4. Are you currently experiencing any other symptoms? No not really. Walked from class to the garden which was about 250 feet and she was SOB. She has a touch of COPD. Never been a problem before and husband wanted [place] to know of this.  Needs to know if she should be seen for this.

## 2015-02-02 NOTE — Telephone Encounter (Signed)
Lmtcb - 3/7 1120

## 2015-02-03 ENCOUNTER — Telehealth: Payer: Self-pay | Admitting: Internal Medicine

## 2015-02-03 ENCOUNTER — Ambulatory Visit (INDEPENDENT_AMBULATORY_CARE_PROVIDER_SITE_OTHER): Payer: Medicare Other | Admitting: Family Medicine

## 2015-02-03 ENCOUNTER — Encounter: Payer: Self-pay | Admitting: Family Medicine

## 2015-02-03 ENCOUNTER — Ambulatory Visit (INDEPENDENT_AMBULATORY_CARE_PROVIDER_SITE_OTHER)
Admission: RE | Admit: 2015-02-03 | Discharge: 2015-02-03 | Disposition: A | Discharge status: Home / Self Care | Payer: Medicare Other | Source: Ambulatory Visit | Attending: Family Medicine | Admitting: Family Medicine

## 2015-02-03 VITALS — BP 122/88 | HR 64 | Temp 98.1°F | Wt 155.5 lb

## 2015-02-03 DIAGNOSIS — R0609 Other forms of dyspnea: Secondary | ICD-10-CM

## 2015-02-03 LAB — CBC WITH DIFFERENTIAL/PLATELET
Basophils Absolute: 0 10*3/uL (ref 0.0–0.1)
Basophils Relative: 0.6 % (ref 0.0–3.0)
Eosinophils Absolute: 0.2 10*3/uL (ref 0.0–0.7)
Eosinophils Relative: 3.2 % (ref 0.0–5.0)
HCT: 37.1 % (ref 36.0–46.0)
Hemoglobin: 12.6 g/dL (ref 12.0–15.0)
Lymphocytes Relative: 30.4 % (ref 12.0–46.0)
Lymphs Abs: 2.3 10*3/uL (ref 0.7–4.0)
MCHC: 33.9 g/dL (ref 30.0–36.0)
MCV: 95.4 fl (ref 78.0–100.0)
Monocytes Absolute: 0.7 10*3/uL (ref 0.1–1.0)
Monocytes Relative: 9.4 % (ref 3.0–12.0)
Neutro Abs: 4.2 10*3/uL (ref 1.4–7.7)
Neutrophils Relative %: 56.4 % (ref 43.0–77.0)
Platelets: 211 10*3/uL (ref 150.0–400.0)
RBC: 3.89 Mil/uL (ref 3.87–5.11)
RDW: 13.3 % (ref 11.5–15.5)
WBC: 7.4 10*3/uL (ref 4.0–10.5)

## 2015-02-03 LAB — TSH: TSH: 3.59 u[IU]/mL (ref 0.35–4.50)

## 2015-02-03 LAB — BRAIN NATRIURETIC PEPTIDE: Pro B Natriuretic peptide (BNP): 362 pg/mL — ABNORMAL HIGH (ref 0.0–100.0)

## 2015-02-03 LAB — MAGNESIUM: Magnesium: 2.8 mg/dL — ABNORMAL HIGH (ref 1.5–2.5)

## 2015-02-03 LAB — COMPREHENSIVE METABOLIC PANEL
ALK PHOS: 65 U/L (ref 39–117)
ALT: 19 U/L (ref 0–35)
AST: 22 U/L (ref 0–37)
Albumin: 4.2 g/dL (ref 3.5–5.2)
BUN: 33 mg/dL — ABNORMAL HIGH (ref 6–23)
CALCIUM: 10.1 mg/dL (ref 8.4–10.5)
CO2: 27 mEq/L (ref 19–32)
Chloride: 104 mEq/L (ref 96–112)
Creatinine, Ser: 1.57 mg/dL — ABNORMAL HIGH (ref 0.40–1.20)
GFR: 33.2 mL/min — AB (ref >60.00)
Glucose, Bld: 112 mg/dL — ABNORMAL HIGH (ref 70–99)
Potassium: 4.5 mEq/L (ref 3.5–5.1)
Sodium: 135 mEq/L (ref 135–145)
Total Bilirubin: 0.4 mg/dL (ref 0.2–1.2)
Total Protein: 7.4 g/dL (ref 6.0–8.3)

## 2015-02-03 NOTE — Telephone Encounter (Signed)
fyi

## 2015-02-03 NOTE — Patient Instructions (Signed)
Nice to meet you. We will call you with your lab and xray results. I do think you should go ahead and make an appointment with Dr. Rockey Situ to be on the safe side. Please go to the ER if you develop symptoms tonight.

## 2015-02-03 NOTE — Progress Notes (Signed)
Subjective:   [name], female    DOB: 11/12/1927, 81 y.o.   MRN: [id number]  Jill Castro is a pleasant 81 y.o. year old female pt of Dr. Nicki Reaper, new to me, who presents to clinic today with Shortness of Breath  on 05/04/6194  HPI: Complicated medical history- does see Dr. Rockey Situ for HTN/diastolic CHF.  2 days ago, was in church and walked 300 feet and became short of breath and weak.  No dizziness, no chest pain. She does not feel it was too warm inside.  Did not go to ER.  Went home and BP was elevated.  Rested and felt ok.  Yesterday, started to have symptoms again but not as bad as first episode.[facility] cardiology- phone note reviewed- felt it was not cardiac and advised to see PCP.  Was told in past that she "has a touch of COPD."  Walking to car to come here she was also short of breath.  Chronic LE edema, left > right, unchanged.  She does take Lasix.  Lab Results  Component Value Date   WBC 6.8 10/28/2014   HGB 12.4 10/28/2014   HCT 37.7 10/28/2014   MCV 97.9 10/28/2014   PLT 215.0 10/28/2014   Lab Results  Component Value Date   CREATININE 1.4* 10/28/2014   Current Outpatient Prescriptions on File Prior to Visit  Medication Sig Dispense Refill  . aspirin 81 MG EC tablet Take 81 mg by mouth daily.      . B Complex Vitamins (PA B-COMPLEX WITH B-12) TABS Take 1 tablet by mouth daily.      . brimonidine (ALPHAGAN P) 0.1 % SOLN Place 1 drop into the left eye 2 (two) times daily.    Marland Kitchen BYSTOLIC 5 MG tablet TAKE 1 TABLET  BY MOUTH 2 TIMES DAILY. 60 tablet 3  . Calcium Carbonate-Vitamin D (CALCIUM-VITAMIN D) 600-200 MG-UNIT CAPS Take by mouth. Takes 1/2 tablet daily.    . dorzolamide-timolol (COSOPT) 22.3-6.8 MG/ML ophthalmic solution Place 1 drop into the left eye 2 (two) times daily.     . furosemide (LASIX) 40 MG tablet TAKE 1 TABLET (40 MG TOTAL) BY MOUTH DAILY. 90 tablet 10  . Glucosamine-Chondroitin 1500-1200 MG/30ML LIQD Take by mouth. Takes  1/2 tablet daily.    . hydrALAZINE (APRESOLINE) 100 MG tablet Take 1 tablet (100 mg total) by mouth 4 (four) times daily. 120 tablet 11  . latanoprost (XALATAN) 0.005 % ophthalmic solution Place 1 drop into the left eye at bedtime.     Marland Kitchen losartan (COZAAR) 100 MG tablet TAKE 1 TABLET BY MOUTH DAILY. 30 tablet 6  . magnesium gluconate (MAGONATE) 500 MG tablet Take 500 mg by mouth daily.      . multivitamin (THERAGRAN) per tablet Take 1 tablet by mouth daily.      Marland Kitchen omeprazole (PRILOSEC) 20 MG capsule TAKE 1 CAPSULE  (20 MG TOTAL) BY MOUTH DAILY. 30 capsule 5  . Polyethylene Glycol 3350 (MIRALAX PO) Take by mouth as needed.     . simvastatin (ZOCOR) 10 MG tablet TAKE 1 TABLET (10 MG TOTAL) BY MOUTH AT BEDTIME. 90 tablet 1  . vitamin C (ASCORBIC ACID) 500 MG tablet Take 500 mg by mouth daily.       No current facility-administered medications on file prior to visit.    Allergies  Allergen Reactions  . Benicar [Olmesartan] Other (See Comments)    Lip swelling  . Celebrex [Celecoxib]   . Penicillins  Past Medical History  Diagnosis Date  . S/P cardiac catheterization 05/2002    Luminal irregularities only in the coronaries. EF 55%. There was some irregulatrity in the right renal artery suggestive of possible fibromuscular dysplasia. Myoview in 5/08 showed no ischemia or infarction. Lexiscan myoview at [facility] (10/10) showed EF 55%, normal wall motion, no evience ofor ischemia or infarction.  . Diastolic CHF, acute     Echo (8/10) with EF 60-65%, mild LVH, mild to moderate TR, PASP 40 mmHg.  Marland Kitchen HTN (hypertension)   . Dizziness     Thought to be side effect of Norvasc  . Hypercalcemia     On HCTZ  . Scoliosis associated with other condition     Chronic back pain  . GERD (gastroesophageal reflux disease)   . Uterine cancer     S/P hypsterectomy in 2008  . Hyperkalemia   . Macular degeneration   . Renal insufficiency   . Hypercholesterolemia   . Osteoporosis   . Renal fibromuscular  dysplasia   . Glaucoma     Past Surgical History  Procedure Laterality Date  . Cardiac catheterization      Left  . Refractive surgery    . Total abdominal hysterectomy w/ bilateral salpingoophorectomy      stage I C well differentiated adenocarcinoma  . Cataract extraction Right     Family History  Problem Relation Age of Onset  . Heart disease Father   . Heart disease Mother   . Parkinson's disease Mother   . Diabetes Cousin     History   Social History  . Marital Status: Married    Spouse Name: N/A  . Number of Children: 2  . Years of Education: N/A   Occupational History  . Not on file.   Social History Main Topics  . Smoking status: Never Smoker   . Smokeless tobacco: Never Used  . Alcohol Use: No  . Drug Use: No  . Sexual Activity: Not on file   Other Topics Concern  . Not on file   Social History Narrative   Lives with husband in [place]   [place], Social History, Family History, Medications, and allergies have been reviewed in [facility], and have been updated if relevant.   Review of Systems  Constitutional: Negative for fever, fatigue and unexpected weight change.  Respiratory: Positive for shortness of breath. Negative for apnea, cough, chest tightness, wheezing and stridor.   Cardiovascular: Negative.   Gastrointestinal: Positive for diarrhea.  Musculoskeletal: Negative.   Skin: Negative.   Psychiatric/Behavioral: Negative.   All other systems reviewed and are negative.      Objective:    BP 122/88 mmHg  Pulse 64  Temp(Src) 98.1 F (36.7 C) (Oral)  Wt 155 lb 8 oz (70.534 kg)  SpO2 97%   Physical Exam  Constitutional: She is oriented to person, place, and time. She appears well-developed and well-nourished. No distress.  HENT:  Head: Normocephalic.  Eyes: Conjunctivae are normal.  Cardiovascular: Normal rate and regular rhythm.   Pulmonary/Chest: Effort normal. No respiratory distress. She has no wheezes. She has no rales. She  exhibits no tenderness.  Musculoskeletal:       Back:  Neurological: She is alert and oriented to person, place, and time. No cranial nerve deficit.  Nursing note and vitals reviewed.         Assessment & Plan:   DOE (dyspnea on exertion) - Plan: TSH, Comprehensive metabolic panel, Magnesium, CBC with Differential/Platelet, Brain natriuretic peptide, DG  Chest 2 View No Follow-up on file.

## 2015-02-03 NOTE — Progress Notes (Signed)
Pre visit review using our clinic review tool, if applicable. No additional management support is needed unless otherwise documented below in the visit note. 

## 2015-02-03 NOTE — Telephone Encounter (Signed)
[facility] Patient Name: Jill Castro DOB: 01-30-1927 Initial Comment Caller states has been having some problems breathing , Heart Dr concerned about COPD told her to call PCP Nurse Assessment Nurse: Ronnald Ramp, RN, Miranda Date/Time (Eastern Time): 02/03/2015 9:46:14 AM Confirm and document reason for call. If symptomatic, describe symptoms. ---Caller states on Sunday she walked 250 feet and felt breathless afterwards. She is still having increased SOB with exertion. Has the patient traveled out of the country within the last 30 days? ---Not Applicable Does the patient require triage? ---Yes Related visit to physician within the last 2 weeks? ---No Does the PT have any chronic conditions? (i.e. diabetes, asthma, etc.) ---Yes List chronic conditions. ---HTN, GERD, High Cholesterol, CHF Guidelines Guideline Title Affirmed Question Affirmed Notes Breathing Difficulty [1] MILD difficulty breathing (e.g., minimal/no SOB at rest, SOB with walking, pulse <100) AND [2] NEW-onset or WORSE than normal Final Disposition User See Physician within 4 Hours (or PCP triage) Ronnald Ramp, RN, Miranda Comments Call dropped while looking for an appt. Called back and reached the pt. Appt scheduled at 12:00pm at [facility] office with Dr. Arnette Norris.

## 2015-02-03 NOTE — Assessment & Plan Note (Signed)
New- etiology unclear at this point.  I cannot rule out cardiac etiology at this point.  I did suggest that she schedule an appointment with Dr. Rockey Situ. Lungs clear- will get CXR today. No wheezes on exam. BNP, CBC, TSH as well today. Follow up with PCP this week. Go to ER if symptoms worsen. The patient indicates understanding of these issues and agrees with the plan.

## 2015-02-04 ENCOUNTER — Encounter: Payer: Self-pay | Admitting: Cardiovascular Disease

## 2015-02-04 ENCOUNTER — Telehealth: Payer: Self-pay

## 2015-02-04 NOTE — Telephone Encounter (Signed)
The patient's husband called and stated Dr.Gollan recommended a follow up visit with Dr.Scott.  He is hoping to find out when the patient can be worked in for this apt.   Thanks!

## 2015-02-04 NOTE — Telephone Encounter (Signed)
Spoke w/ pt's husband.  Advised him to follow Dr. Hulen Shouts recommendation. He is agreeable and will call back w/ any further questions or concerns.

## 2015-02-04 NOTE — Telephone Encounter (Signed)
Pt husband called, states [facility] has told pt to stop her magnesium, and he would like to verify this with Dr. Darnell Level. Pt has an appt with Dr. Darnell Level. on 3/28.

## 2015-02-05 NOTE — Telephone Encounter (Signed)
See if she can come in Monday 02/09/15 at 10:00.  (block 30 min).  Thanks.  Please also block the 10:30 appt as well for work in.

## 2015-02-05 NOTE — Telephone Encounter (Signed)
Patient scheduled and notified.

## 2015-02-09 ENCOUNTER — Encounter: Payer: Self-pay | Admitting: Internal Medicine

## 2015-02-09 ENCOUNTER — Ambulatory Visit (INDEPENDENT_AMBULATORY_CARE_PROVIDER_SITE_OTHER): Payer: Medicare Other | Admitting: Internal Medicine

## 2015-02-09 VITALS — BP 154/74 | HR 64 | Temp 97.8°F | Ht 64.0 in | Wt 154.0 lb

## 2015-02-09 DIAGNOSIS — R0602 Shortness of breath: Secondary | ICD-10-CM

## 2015-02-09 DIAGNOSIS — K2100 Gastro-esophageal reflux disease with esophagitis, without bleeding: Secondary | ICD-10-CM

## 2015-02-09 DIAGNOSIS — I159 Secondary hypertension, unspecified: Secondary | ICD-10-CM | POA: Diagnosis not present

## 2015-02-09 DIAGNOSIS — N183 Chronic kidney disease, stage 3 unspecified: Secondary | ICD-10-CM

## 2015-02-09 DIAGNOSIS — M810 Age-related osteoporosis without current pathological fracture: Secondary | ICD-10-CM

## 2015-02-09 DIAGNOSIS — E7800 Pure hypercholesterolemia, unspecified: Secondary | ICD-10-CM

## 2015-02-09 DIAGNOSIS — C541 Malignant neoplasm of endometrium: Secondary | ICD-10-CM

## 2015-02-09 DIAGNOSIS — G8929 Other chronic pain: Secondary | ICD-10-CM

## 2015-02-09 DIAGNOSIS — M549 Dorsalgia, unspecified: Secondary | ICD-10-CM

## 2015-02-09 NOTE — Progress Notes (Signed)
Pre visit review using our clinic review tool, if applicable. No additional management support is needed unless otherwise documented below in the visit note. 

## 2015-02-11 ENCOUNTER — Other Ambulatory Visit: Payer: Self-pay | Admitting: Internal Medicine

## 2015-02-11 DIAGNOSIS — R0602 Shortness of breath: Secondary | ICD-10-CM

## 2015-02-11 NOTE — Progress Notes (Signed)
Referral placed for cardiology

## 2015-02-12 ENCOUNTER — Encounter: Payer: Self-pay | Admitting: Cardiovascular Disease

## 2015-02-12 ENCOUNTER — Ambulatory Visit (INDEPENDENT_AMBULATORY_CARE_PROVIDER_SITE_OTHER): Payer: Medicare Other | Admitting: Cardiovascular Disease

## 2015-02-12 VITALS — BP 130/80 | HR 64 | Ht 64.0 in | Wt 153.5 lb

## 2015-02-12 DIAGNOSIS — N183 Chronic kidney disease, stage 3 unspecified: Secondary | ICD-10-CM

## 2015-02-12 DIAGNOSIS — R0602 Shortness of breath: Secondary | ICD-10-CM

## 2015-02-12 DIAGNOSIS — I159 Secondary hypertension, unspecified: Secondary | ICD-10-CM

## 2015-02-12 DIAGNOSIS — I5032 Chronic diastolic (congestive) heart failure: Secondary | ICD-10-CM

## 2015-02-12 DIAGNOSIS — R609 Edema, unspecified: Secondary | ICD-10-CM

## 2015-02-12 NOTE — Progress Notes (Signed)
[name], female    DOB: [birth year], 81 y.o.   MRN: [id number]  HPI Comments: Ms. Jill Castro is an 81 yo pleasant woman with history of HTN and diastolic CHF, chronic lower extremity edema (left leg in particular), presents for followup of her hypertension and diastolic CHF  She has severe scoliosis and chronic back pain and requires pain medication.   In follow-up today, she reports that she continues to have severe back pain on the right side SI joint She was recently walking in church, walked around 300 feet when she developed significant shortness of breath. She excused herself, went home, had a snack and went to sleep for 3 hours, woke up and felt fine She's not very active at baseline, no regular exercise program. She was seen by Dr. Deborra Medina, had blood work. Follow-up with Dr. Nicki Reaper. Husband reports she has been fine since then, back to baseline. BNP 300, BUN and creatinine slightly above her baseline. She continues to take Lasix 40 mg every other day. Currently she feels well with no complaints She reports blood pressure has been well-controlled recently, seems to have improved by holding magnesium  Other past medical history Chronic back pain, not a candidate for back surgery Continued eye problem, macular degeneration. Baseline Creatinine 1.4 Tries to wear compression hose when she can.   catheterization in 2003 showing luminal irregularities,  negative stress test in October 2010 with no ischemia, echocardiogram August 2010 showing normal systolic function, mild to moderate tricuspid regurgitation, mildly elevated right ventricular systolic pressures consistent with mild pulmonary hypertension, ejection fraction 60%.  Allergies  Allergen Reactions  . Benicar [Olmesartan] Other (See Comments)    Lip swelling  . Celebrex [Celecoxib]   . Penicillins     Outpatient Encounter Prescriptions as of 02/12/2015  Medication Sig  . aspirin 81 MG EC tablet Take 81 mg by  mouth daily.    . B Complex Vitamins (PA B-COMPLEX WITH B-12) TABS Take 1 tablet by mouth daily.    . brimonidine (ALPHAGAN P) 0.1 % SOLN Place 1 drop into the left eye 2 (two) times daily.  Marland Kitchen BYSTOLIC 5 MG tablet TAKE 1 TABLET  BY MOUTH 2 TIMES DAILY.  . Calcium Carbonate-Vitamin D (CALCIUM-VITAMIN D) 600-200 MG-UNIT CAPS Take by mouth. Takes 1/2 tablet daily.  . dorzolamide-timolol (COSOPT) 22.3-6.8 MG/ML ophthalmic solution Place 1 drop into the left eye 2 (two) times daily.   . furosemide (LASIX) 40 MG tablet Take 40 mg by mouth every other day.  . Glucosamine-Chondroitin 1500-1200 MG/30ML LIQD Take by mouth. Takes 1/2 tablet daily.  . hydrALAZINE (APRESOLINE) 100 MG tablet Take 1 tablet (100 mg total) by mouth 4 (four) times daily.  Marland Kitchen latanoprost (XALATAN) 0.005 % ophthalmic solution Place 1 drop into the left eye at bedtime.   Marland Kitchen losartan (COZAAR) 100 MG tablet TAKE 1 TABLET BY MOUTH DAILY.  . multivitamin (THERAGRAN) per tablet Take 1 tablet by mouth daily.    Marland Kitchen omeprazole (PRILOSEC) 20 MG capsule TAKE 1 CAPSULE  (20 MG TOTAL) BY MOUTH DAILY.  . Polyethylene Glycol 3350 (MIRALAX PO) Take by mouth as needed.   . simvastatin (ZOCOR) 10 MG tablet TAKE 1 TABLET (10 MG TOTAL) BY MOUTH AT BEDTIME.  . vitamin C (ASCORBIC ACID) 500 MG tablet Take 500 mg by mouth daily.    . [DISCONTINUED] furosemide (LASIX) 40 MG tablet TAKE 1 TABLET (40 MG TOTAL) BY MOUTH DAILY. (Patient not taking: Reported on 02/12/2015)    Past Medical  History  Diagnosis Date  . S/P cardiac catheterization 05/2002    Luminal irregularities only in the coronaries. EF 55%. There was some irregulatrity in the right renal artery suggestive of possible fibromuscular dysplasia. Myoview in 5/08 showed no ischemia or infarction. Lexiscan myoview at [facility] (10/10) showed EF 55%, normal wall motion, no evience ofor ischemia or infarction.  . Diastolic CHF, acute     Echo (8/10) with EF 60-65%, mild LVH, mild to moderate TR, PASP 40  mmHg.  Marland Kitchen HTN (hypertension)   . Dizziness     Thought to be side effect of Norvasc  . Hypercalcemia     On HCTZ  . Scoliosis associated with other condition     Chronic back pain  . GERD (gastroesophageal reflux disease)   . Uterine cancer     S/P hypsterectomy in 2008  . Hyperkalemia   . Macular degeneration   . Renal insufficiency   . Hypercholesterolemia   . Osteoporosis   . Renal fibromuscular dysplasia   . Glaucoma     Past Surgical History  Procedure Laterality Date  . Cardiac catheterization      Left  . Refractive surgery    . Total abdominal hysterectomy w/ bilateral salpingoophorectomy      stage I C well differentiated adenocarcinoma  . Cataract extraction Right     Social History  reports that she has never smoked. She has never used smokeless tobacco. She reports that she does not drink alcohol or use illicit drugs.  Family History family history includes Diabetes in her cousin; Heart disease in her father and mother; Parkinson's disease in her mother.    Review of Systems  Constitutional: Negative.   Respiratory: Positive for shortness of breath.   Cardiovascular: Positive for leg swelling.       Left leg only  Gastrointestinal: Negative.   Musculoskeletal: Positive for back pain.  Allergic/Immunologic: Negative.   Neurological: Negative.   Hematological: Negative.   Psychiatric/Behavioral: Negative.   All other systems reviewed and are negative.   BP 130/80 mmHg  Pulse 64  Ht 5\' 4"  (1.626 m)  Wt 153 lb 8 oz (69.627 kg)  BMI 26.34 kg/m2  Physical Exam  Constitutional: She is oriented to person, place, and time. She appears well-developed and well-nourished.  Frail  HENT:  Head: Normocephalic.  Nose: Nose normal.  Mouth/Throat: Oropharynx is clear and moist.  Eyes: Conjunctivae are normal. Pupils are equal, round, and reactive to light.  Neck: Normal range of motion. Neck supple. No JVD present.  Cardiovascular: Normal rate, regular  rhythm, S1 normal, S2 normal, normal heart sounds and intact distal pulses.  Exam reveals no gallop and no friction rub.   No murmur heard. Nonpitting edema to below the knee, compression hose in place  Pulmonary/Chest: Effort normal and breath sounds normal. No respiratory distress. She has no wheezes. She has no rales. She exhibits no tenderness.  Abdominal: Soft. Bowel sounds are normal. She exhibits no distension. There is no tenderness.  Musculoskeletal: Normal range of motion. She exhibits no edema or tenderness.  Lymphadenopathy:    She has no cervical adenopathy.  Neurological: She is alert and oriented to person, place, and time. Coordination normal.  Skin: Skin is warm and dry. No rash noted. No erythema.  Psychiatric: She has a normal mood and affect. Her behavior is normal. Judgment and thought content normal.    Assessment and Plan  Nursing note and vitals reviewed.

## 2015-02-12 NOTE — Assessment & Plan Note (Signed)
BNP not for a high, BUN and creatinine slightly above her baseline concerning for prerenal state. No signs of heart failure on today's visit. Suggested she continue on her Lasix every other day. We have recommended that if she has recurrent shortness of breath symptoms, that she call our office for echocardiogram

## 2015-02-12 NOTE — Assessment & Plan Note (Signed)
I suspect her shortness of breath is secondary to deconditioning, also with chronic pain. Less likely acute on chronic diastolic CHF given her lab work with minimally elevated BNP Again we have suggested to her that if symptoms recur, echocardiogram could be ordered.

## 2015-02-12 NOTE — Assessment & Plan Note (Signed)
Blood pressure is well controlled on today's visit. No changes made to the medications. 

## 2015-02-12 NOTE — Assessment & Plan Note (Signed)
Recent BUN/creatinine slightly above her baseline of 1.4. Suggested she continue on her Lasix every other day, we'll try to avoid overdiuresis Echocardiogram could be done if shortness of breath symptoms get worse or if there is confusion about her fluid status

## 2015-02-12 NOTE — Assessment & Plan Note (Signed)
Nonpitting edema, likely chronic venous insufficiency. Stable with compression hose

## 2015-02-12 NOTE — Patient Instructions (Addendum)
You are doing well. No medication changes were made.  Please call us if you have new issues that need to be addressed before your next appt.  Your physician wants you to follow-up in: 3 months You will receive a reminder letter in the mail two months in advance. If you don't receive a letter, please call our office to schedule the follow-up appointment.   

## 2015-02-13 ENCOUNTER — Other Ambulatory Visit: Payer: Self-pay | Admitting: Cardiovascular Disease

## 2015-02-15 ENCOUNTER — Encounter: Payer: Self-pay | Admitting: Internal Medicine

## 2015-02-15 NOTE — Progress Notes (Signed)
Patient ID: AZA DANTES, female   DOB: 08/12/1927, 81 y.o.   MRN: [id number]   Subjective:    [name], female    DOB: 07-12-1927, 81 y.o.   MRN: [id number]  HPI  Patient here as a work in - for follow up from an acute visit recently. She was evaluated by Dr Deborra Medina on 02/03/15 with sob.  See her note for details.  BNP slightly elevated.  Cr 1.57.  She describes the episode as feeling breathless and weak.  Occurred after walking a long distance at church.  Only had the one episode.  States normally does not do a lot of physical activity secondary to her back pain.  This (she feels) limits her more than anything.  Feels her back is gradually getting worse.  Has seen multiple specialist.  Not a surgical candidate.  Has not had reoccurring episodes of sob or DOE - like she experienced at church.  She does report she is back on miralax now.  Magnesium made her stool to loose.  Off magnesium, was a little constipated.  Back on miralax now and bowels better.  Overall, she feels things are stable.     Past Medical History  Diagnosis Date  . S/P cardiac catheterization 05/2002    Luminal irregularities only in the coronaries. EF 55%. There was some irregulatrity in the right renal artery suggestive of possible fibromuscular dysplasia. Myoview in 5/08 showed no ischemia or infarction. Lexiscan myoview at [facility] (10/10) showed EF 55%, normal wall motion, no evience ofor ischemia or infarction.  . Diastolic CHF, acute     Echo (8/10) with EF 60-65%, mild LVH, mild to moderate TR, PASP 40 mmHg.  Marland Kitchen HTN (hypertension)   . Dizziness     Thought to be side effect of Norvasc  . Hypercalcemia     On HCTZ  . Scoliosis associated with other condition     Chronic back pain  . GERD (gastroesophageal reflux disease)   . Uterine cancer     S/P hypsterectomy in 2008  . Hyperkalemia   . Macular degeneration   . Renal insufficiency   . Hypercholesterolemia   . Osteoporosis   . Renal  fibromuscular dysplasia   . Glaucoma     Current Outpatient Prescriptions on File Prior to Visit  Medication Sig Dispense Refill  . aspirin 81 MG EC tablet Take 81 mg by mouth daily.      . B Complex Vitamins (PA B-COMPLEX WITH B-12) TABS Take 1 tablet by mouth daily.      . brimonidine (ALPHAGAN P) 0.1 % SOLN Place 1 drop into the left eye 2 (two) times daily.    Marland Kitchen BYSTOLIC 5 MG tablet TAKE 1 TABLET  BY MOUTH 2 TIMES DAILY. 60 tablet 3  . Calcium Carbonate-Vitamin D (CALCIUM-VITAMIN D) 600-200 MG-UNIT CAPS Take by mouth. Takes 1/2 tablet daily.    . dorzolamide-timolol (COSOPT) 22.3-6.8 MG/ML ophthalmic solution Place 1 drop into the left eye 2 (two) times daily.     . Glucosamine-Chondroitin 1500-1200 MG/30ML LIQD Take by mouth. Takes 1/2 tablet daily.    . hydrALAZINE (APRESOLINE) 100 MG tablet Take 1 tablet (100 mg total) by mouth 4 (four) times daily. 120 tablet 11  . latanoprost (XALATAN) 0.005 % ophthalmic solution Place 1 drop into the left eye at bedtime.     Marland Kitchen losartan (COZAAR) 100 MG tablet TAKE 1 TABLET BY MOUTH DAILY. 30 tablet 6  . multivitamin (THERAGRAN) per tablet Take 1  tablet by mouth daily.      Marland Kitchen omeprazole (PRILOSEC) 20 MG capsule TAKE 1 CAPSULE  (20 MG TOTAL) BY MOUTH DAILY. 30 capsule 5  . Polyethylene Glycol 3350 (MIRALAX PO) Take by mouth as needed.     . simvastatin (ZOCOR) 10 MG tablet TAKE 1 TABLET (10 MG TOTAL) BY MOUTH AT BEDTIME. 90 tablet 1  . vitamin C (ASCORBIC ACID) 500 MG tablet Take 500 mg by mouth daily.       No current facility-administered medications on file prior to visit.    Review of Systems  Constitutional: Negative for appetite change and unexpected weight change.  HENT: Negative for congestion and sinus pressure.   Respiratory: Positive for shortness of breath (had the episode as outlined.  breathing stable now.  ). Negative for cough and chest tightness.   Cardiovascular: Negative for chest pain, palpitations and leg swelling (no  increased swelling).  Gastrointestinal: Positive for constipation (noticed constipation off magnesium. ). Negative for nausea, vomiting and abdominal pain.  Genitourinary: Negative for dysuria and difficulty urinating.  Musculoskeletal: Positive for back pain (worsening). Negative for joint swelling.  Skin: Negative for color change and rash.  Neurological: Negative for dizziness, light-headedness and headaches.       Objective:     Blood pressure recheck:  138/62, pulse 60  Physical Exam  Constitutional: She appears well-developed and well-nourished. No distress.  HENT:  Nose: Nose normal.  Mouth/Throat: Oropharynx is clear and moist.  Neck: Neck supple. No thyromegaly present.  Cardiovascular: Normal rate and regular rhythm.   Pulmonary/Chest: Breath sounds normal. No respiratory distress. She has no wheezes.  Abdominal: Soft. Bowel sounds are normal. There is no tenderness.  Musculoskeletal: She exhibits no edema (no increased edema.  ) or tenderness.  Lymphadenopathy:    She has no cervical adenopathy.  Skin: No rash noted. No erythema.  Psychiatric: She has a normal mood and affect. Her behavior is normal.    BP 154/74 mmHg  Pulse 64  Temp(Src) 97.8 F (36.6 C) (Oral)  Ht 5\' 4"  (1.626 m)  Wt 154 lb (69.854 kg)  BMI 26.42 kg/m2  SpO2 97% Wt Readings from Last 3 Encounters:  02/12/15 153 lb 8 oz (69.627 kg)  02/09/15 154 lb (69.854 kg)  02/03/15 155 lb 8 oz (70.534 kg)     Lab Results  Component Value Date   WBC 7.4 02/03/2015   HGB 12.6 02/03/2015   HCT 37.1 02/03/2015   PLT 211.0 02/03/2015   GLUCOSE 112* 02/03/2015   CHOL 170 10/28/2014   TRIG 113.0 10/28/2014   HDL 66.30 10/28/2014   LDLCALC 81 10/28/2014   ALT 19 02/03/2015   AST 22 02/03/2015   NA 135 02/03/2015   K 4.5 02/03/2015   CL 104 02/03/2015   CREATININE 1.57* 02/03/2015   BUN 33* 02/03/2015   CO2 27 02/03/2015   TSH 3.59 02/03/2015    Dg Chest 2 View  02/03/2015   CLINICAL DATA:   Shortness of breath with exertion  EXAM: CHEST  2 VIEW  COMPARISON:  None.  FINDINGS: Chronic interstitial markings. No focal consolidation. No pleural effusion or pneumothorax.  Heart is normal in size.  Degenerative changes of the thoracic spine with exaggerated lower thoracic kyphosis.  Moderate hiatal hernia.  IMPRESSION: No evidence of acute cardiopulmonary disease.   Electronically Signed   By: Julian Hy M.D.   On: 02/03/2015 14:58       Assessment & Plan:   Problem List Items Addressed  This Visit    Chronic back pain    Has known scoliosis.  Has seen surgery and multiple specialist.  Not a surgical candidate.  Continues to take tylenol for pain.  Unable to take narcotics.        Chronic kidney disease (CKD), stage III (moderate)    Last Cr 1.57.  Sees nephrology.  Follow metabolic panel.        Endometrial cancer    Followed by gyn.  Keep up to date.        GERD (gastroesophageal reflux disease)    Symptoms controlled.  On omeprazole.        Hypercholesterolemia    On simvastatin.  Follow lipid panel and liver function tests.        Relevant Orders   Lipid panel   Hepatic function panel   Hypertension    Blood pressure appears to be doing well.  Same medication regimen.  Follow pressures.  Follow metabolic panel.       Relevant Orders   Basic metabolic panel   Osteoporosis    Unable to take bisphosphonates.  Check vitamin D level with next labs.       Relevant Orders   Vit D  25 hydroxy (rtn osteoporosis monitoring)   SHORTNESS OF BREATH    Had the episode of sob and DOE as outlined.  Not very active.  Back limits her.  EKG obtained and revealed SR with no acute ischemic changes.  She is already scheduled to f/u with Dr Rockey Situ.  Will see if he can see her this week, since she is going to the beach next week. Further w/up pending cardiology assessment.  May need f/u ECHO, etc.        Other Visit Diagnoses    SOB (shortness of breath)    -  Primary     Relevant Orders    EKG 12-Lead (Completed)      I spent 25 minutes with the patient and more than 50% of the time was spent in consultation regarding the above.     Einar Pheasant, MD

## 2015-02-15 NOTE — Assessment & Plan Note (Signed)
On simvastatin.  Follow lipid panel and liver function tests.   

## 2015-02-15 NOTE — Assessment & Plan Note (Signed)
Symptoms controlled.  On omeprazole.

## 2015-02-15 NOTE — Assessment & Plan Note (Signed)
Unable to take bisphosphonates.  Check vitamin D level with next labs.

## 2015-02-15 NOTE — Assessment & Plan Note (Signed)
Followed by gyn.  Keep up to date.

## 2015-02-15 NOTE — Assessment & Plan Note (Signed)
Had the episode of sob and DOE as outlined.  Not very active.  Back limits her.  EKG obtained and revealed SR with no acute ischemic changes.  She is already scheduled to f/u with Dr Rockey Situ.  Will see if he can see her this week, since she is going to the beach next week. Further w/up pending cardiology assessment.  May need f/u ECHO, etc.

## 2015-02-15 NOTE — Assessment & Plan Note (Signed)
Last Cr 1.57.  Sees nephrology.  Follow metabolic panel.

## 2015-02-15 NOTE — Assessment & Plan Note (Signed)
Has known scoliosis.  Has seen surgery and multiple specialist.  Not a surgical candidate.  Continues to take tylenol for pain.  Unable to take narcotics.

## 2015-02-15 NOTE — Assessment & Plan Note (Signed)
Blood pressure appears to be doing well.  Same medication regimen.  Follow pressures.  Follow metabolic panel.

## 2015-02-16 ENCOUNTER — Other Ambulatory Visit: Payer: Self-pay | Admitting: Internal Medicine

## 2015-02-23 ENCOUNTER — Ambulatory Visit: Payer: Medicare Other | Admitting: Cardiovascular Disease

## 2015-03-01 ENCOUNTER — Other Ambulatory Visit: Payer: Self-pay | Admitting: Cardiovascular Disease

## 2015-03-12 ENCOUNTER — Emergency Department
Admit: 2015-03-12 | Disposition: A | Discharge status: Home / Self Care | Payer: Self-pay | Admitting: Emergency Medicine

## 2015-03-12 ENCOUNTER — Telehealth: Payer: Self-pay | Admitting: Internal Medicine

## 2015-03-12 NOTE — Telephone Encounter (Signed)
Agree with ER visit.   Can get update from pt.

## 2015-03-12 NOTE — Telephone Encounter (Signed)
[facility] Patient Name: Jill Castro DOB: 1927-05-31 Initial Comment Caller states she fell last Friday. She's sore in her rib area where she fell. Nurse Assessment Nurse: Martyn Ehrich RN, Felicia Date/Time (Eastern Time): 03/12/2015 3:49:34 PM Confirm and document reason for call. If symptomatic, describe symptoms. ---Pt landed flat on her stomach and ribs - more the right. It is above Office manager and below breast but on the R. There were 2 steps and a sidewalk and she feels she missed the last step and was running trying to get her balance and fell in the grass. Last Friday about noon. When she gets out of chair the pain level is 9. But if she has heating pad and in a chair not moving no pain. Has the patient traveled out of the country within the last 30 days? ---NoDoes the patient require triage? ---Yes Related visit to physician within the last 2 weeks? ---No Does the PT have any chronic conditions? (i.e. diabetes, asthma, etc.) ---No Guidelines Guideline Title Affirmed Question Affirmed Notes Chest Injury SEVERE chest pain Final Disposition User Go to ED Now Martyn Ehrich, RN, [place] Call Id: [id number]

## 2015-03-12 NOTE — Telephone Encounter (Signed)
Pt called back to report that he is in the ER currently & will call back tomorrow with an update.

## 2015-03-12 NOTE — Telephone Encounter (Signed)
FYI   Attempted to call pts home phone and emergency contacts cell phone with no answer on either.

## 2015-03-12 NOTE — Telephone Encounter (Signed)
Please see if patient went to ER & then route to Dr. Nicki Reaper

## 2015-03-13 NOTE — Telephone Encounter (Signed)
Jill Castro called to update from ER visit yesterday. They did not find anything of significance & was told to continue therapy & no follow-up appt was recommended.

## 2015-03-14 NOTE — Telephone Encounter (Signed)
Thank you for the update.  Will review records once they are available.

## 2015-03-16 NOTE — Telephone Encounter (Signed)
ER records requested.

## 2015-03-16 NOTE — Telephone Encounter (Signed)
Records received & placed in green folder

## 2015-03-16 NOTE — Telephone Encounter (Signed)
Will review 

## 2015-03-30 ENCOUNTER — Other Ambulatory Visit: Payer: Self-pay | Admitting: Cardiovascular Disease

## 2015-04-14 ENCOUNTER — Other Ambulatory Visit (INDEPENDENT_AMBULATORY_CARE_PROVIDER_SITE_OTHER): Payer: Medicare Other

## 2015-04-14 ENCOUNTER — Encounter: Payer: Self-pay | Admitting: Internal Medicine

## 2015-04-14 DIAGNOSIS — E7800 Pure hypercholesterolemia, unspecified: Secondary | ICD-10-CM

## 2015-04-14 DIAGNOSIS — I159 Secondary hypertension, unspecified: Secondary | ICD-10-CM | POA: Diagnosis not present

## 2015-04-14 DIAGNOSIS — M810 Age-related osteoporosis without current pathological fracture: Secondary | ICD-10-CM | POA: Diagnosis not present

## 2015-04-14 LAB — BASIC METABOLIC PANEL
BUN: 37 mg/dL — ABNORMAL HIGH (ref 6–23)
CALCIUM: 10.1 mg/dL (ref 8.4–10.5)
CO2: 24 mEq/L (ref 19–32)
Chloride: 107 mEq/L (ref 96–112)
Creatinine, Ser: 1.56 mg/dL — ABNORMAL HIGH (ref 0.40–1.20)
GFR: 33.43 mL/min — ABNORMAL LOW (ref >60.00)
GLUCOSE: 104 mg/dL — AB (ref 70–99)
Potassium: 4.8 mEq/L (ref 3.5–5.1)
Sodium: 137 mEq/L (ref 135–145)

## 2015-04-14 LAB — HEPATIC FUNCTION PANEL
ALT: 17 U/L (ref 0–35)
AST: 23 U/L (ref 0–37)
Albumin: 4.1 g/dL (ref 3.5–5.2)
Alkaline Phosphatase: 82 U/L (ref 39–117)
BILIRUBIN TOTAL: 0.4 mg/dL (ref 0.2–1.2)
Bilirubin, Direct: 0.1 mg/dL (ref 0.0–0.3)
TOTAL PROTEIN: 6.9 g/dL (ref 6.0–8.3)

## 2015-04-14 LAB — VITAMIN D 25 HYDROXY (VIT D DEFICIENCY, FRACTURES): VITD: 45.98 ng/mL (ref 30.00–100.00)

## 2015-04-14 LAB — LIPID PANEL
CHOL/HDL RATIO: 2
CHOLESTEROL: 147 mg/dL (ref 0–200)
HDL: 64.8 mg/dL (ref >39.00)
LDL Cholesterol: 58 mg/dL (ref 0–99)
NONHDL: 82.2
TRIGLYCERIDES: 120 mg/dL (ref 0.0–149.0)
VLDL: 24 mg/dL (ref 0.0–40.0)

## 2015-04-20 ENCOUNTER — Ambulatory Visit: Payer: Medicare Other | Admitting: Internal Medicine

## 2015-04-21 ENCOUNTER — Ambulatory Visit (INDEPENDENT_AMBULATORY_CARE_PROVIDER_SITE_OTHER): Payer: Medicare Other | Admitting: Internal Medicine

## 2015-04-21 ENCOUNTER — Encounter: Payer: Self-pay | Admitting: Internal Medicine

## 2015-04-21 VITALS — BP 140/70 | HR 65 | Temp 97.9°F | Ht 64.0 in | Wt 151.5 lb

## 2015-04-21 DIAGNOSIS — E7800 Pure hypercholesterolemia, unspecified: Secondary | ICD-10-CM

## 2015-04-21 DIAGNOSIS — K2100 Gastro-esophageal reflux disease with esophagitis, without bleeding: Secondary | ICD-10-CM

## 2015-04-21 DIAGNOSIS — M810 Age-related osteoporosis without current pathological fracture: Secondary | ICD-10-CM | POA: Diagnosis not present

## 2015-04-21 DIAGNOSIS — I159 Secondary hypertension, unspecified: Secondary | ICD-10-CM

## 2015-04-21 DIAGNOSIS — M549 Dorsalgia, unspecified: Secondary | ICD-10-CM

## 2015-04-21 DIAGNOSIS — N183 Chronic kidney disease, stage 3 unspecified: Secondary | ICD-10-CM

## 2015-04-21 DIAGNOSIS — Z0000 Encounter for general adult medical examination without abnormal findings: Secondary | ICD-10-CM

## 2015-04-21 DIAGNOSIS — G8929 Other chronic pain: Secondary | ICD-10-CM

## 2015-04-21 DIAGNOSIS — C541 Malignant neoplasm of endometrium: Secondary | ICD-10-CM

## 2015-04-21 NOTE — Progress Notes (Signed)
Pre visit review using our clinic review tool, if applicable. No additional management support is needed unless otherwise documented below in the visit note. 

## 2015-04-21 NOTE — Progress Notes (Signed)
[name], female   DOB: [birth year], 81 y.o.   MRN: [id number]   Subjective:    [name], female    DOB: 1927-07-20, 81 y.o.   MRN: [id number]  HPI  Patient here for a scheduled follow up.  Increased stress.  Husband has just been diagnosed with pancreatic cancer.  Discussed with her today.  Does not feel she needs anything more at this point.  Has been seeing gyn.  Has a history of endometrial cancer.  Overdue f/u with gyn.  She wants to hold on appt at this time.  Has f/u with nephrology in 07/2015.  Renal function stable.  Blood pressure stable on current regimen.  Adjusted hydralazine regimen.  Doing better.  Eating and drinking well.  Bowels stable.     Past Medical History  Diagnosis Date  . S/P cardiac catheterization 05/2002    Luminal irregularities only in the coronaries. EF 55%. There was some irregulatrity in the right renal artery suggestive of possible fibromuscular dysplasia. Myoview in 5/08 showed no ischemia or infarction. Lexiscan myoview at [facility] (10/10) showed EF 55%, normal wall motion, no evience ofor ischemia or infarction.  . Diastolic CHF, acute     Echo (8/10) with EF 60-65%, mild LVH, mild to moderate TR, PASP 40 mmHg.  Marland Kitchen HTN (hypertension)   . Dizziness     Thought to be side effect of Norvasc  . Hypercalcemia     On HCTZ  . Scoliosis associated with other condition     Chronic back pain  . GERD (gastroesophageal reflux disease)   . Uterine cancer     S/P hypsterectomy in 2008  . Hyperkalemia   . Macular degeneration   . Renal insufficiency   . Hypercholesterolemia   . Osteoporosis   . Renal fibromuscular dysplasia   . Glaucoma     Current Outpatient Prescriptions on File Prior to Visit  Medication Sig Dispense Refill  . aspirin 81 MG EC tablet Take 81 mg by mouth daily.      . B Complex Vitamins (PA B-COMPLEX WITH B-12) TABS Take 1 tablet by mouth daily.      . brimonidine (ALPHAGAN P) 0.1 % SOLN Place 1 drop  into the left eye 2 (two) times daily.    Marland Kitchen BYSTOLIC 5 MG tablet TAKE 1 TABLET  BY MOUTH 2 TIMES DAILY. 60 tablet 6  . Calcium Carbonate-Vitamin D (CALCIUM-VITAMIN D) 600-200 MG-UNIT CAPS Take by mouth. Takes 1/2 tablet daily.    . dorzolamide-timolol (COSOPT) 22.3-6.8 MG/ML ophthalmic solution Place 1 drop into the left eye 2 (two) times daily.     . furosemide (LASIX) 40 MG tablet TAKE 1 TABLET (40 MG TOTAL) BY MOUTH DAILY. 90 tablet 3  . Glucosamine-Chondroitin 1500-1200 MG/30ML LIQD Take by mouth. Takes 1/2 tablet daily.    . hydrALAZINE (APRESOLINE) 100 MG tablet Take 1 tablet (100 mg total) by mouth 4 (four) times daily. (Patient taking differently: 100mg  (am), 50mg  (supper), 100mg  (bedtime)) 120 tablet 11  . latanoprost (XALATAN) 0.005 % ophthalmic solution Place 1 drop into the left eye at bedtime.     Marland Kitchen losartan (COZAAR) 100 MG tablet TAKE 1 TABLET BY MOUTH DAILY. 30 tablet 3  . multivitamin (THERAGRAN) per tablet Take 1 tablet by mouth daily.      Marland Kitchen omeprazole (PRILOSEC) 20 MG capsule TAKE 1 CAPSULE  (20 MG TOTAL) BY MOUTH DAILY. 30 capsule 5  . Polyethylene Glycol 3350 (MIRALAX PO) Take by mouth  as needed.     . simvastatin (ZOCOR) 10 MG tablet TAKE 1 TABLET  BY MOUTH AT BEDTIME. 90 tablet 1  . vitamin C (ASCORBIC ACID) 500 MG tablet Take 500 mg by mouth daily.       No current facility-administered medications on file prior to visit.    Review of Systems  Constitutional: Negative for appetite change and unexpected weight change.  HENT: Negative for congestion and sinus pressure.   Respiratory: Negative for cough, chest tightness and shortness of breath.   Cardiovascular: Negative for chest pain and palpitations.  Gastrointestinal: Negative for nausea, vomiting, abdominal pain and diarrhea.  Musculoskeletal: Positive for back pain (chronic back pain). Negative for joint swelling.  Skin: Negative for color change and rash.  Neurological: Negative for dizziness, light-headedness  and headaches.  Hematological: Negative for adenopathy. Does not bruise/bleed easily.  Psychiatric/Behavioral: Negative for dysphoric mood and agitation.       Increased stress as outlined.        Objective:     Blood pressure recheck:  148-150/72  Physical Exam  Constitutional: She appears well-developed and well-nourished. No distress.  HENT:  Nose: Nose normal.  Mouth/Throat: Oropharynx is clear and moist.  Neck: Neck supple. No thyromegaly present.  Cardiovascular: Normal rate and regular rhythm.   Pulmonary/Chest: Breath sounds normal. No respiratory distress. She has no wheezes.  Abdominal: Soft. Bowel sounds are normal. There is no tenderness.  Musculoskeletal: She exhibits no edema or tenderness.  Lymphadenopathy:    She has no cervical adenopathy.  Skin: No rash noted. No erythema.  Psychiatric: She has a normal mood and affect. Her behavior is normal.    BP 140/70 mmHg  Pulse 65  Temp(Src) 97.9 F (36.6 C) (Oral)  Ht 5\' 4"  (1.626 m)  Wt 151 lb 8 oz (68.72 kg)  BMI 25.99 kg/m2  SpO2 98% Wt Readings from Last 3 Encounters:  04/21/15 151 lb 8 oz (68.72 kg)  02/12/15 153 lb 8 oz (69.627 kg)  02/09/15 154 lb (69.854 kg)     Lab Results  Component Value Date   WBC 7.4 02/03/2015   HGB 12.6 02/03/2015   HCT 37.1 02/03/2015   PLT 211.0 02/03/2015   GLUCOSE 104* 04/14/2015   CHOL 147 04/14/2015   TRIG 120.0 04/14/2015   HDL 64.80 04/14/2015   LDLCALC 58 04/14/2015   ALT 17 04/14/2015   AST 23 04/14/2015   NA 137 04/14/2015   K 4.8 04/14/2015   CL 107 04/14/2015   CREATININE 1.56* 04/14/2015   BUN 37* 04/14/2015   CO2 24 04/14/2015   TSH 3.59 02/03/2015       Assessment & Plan:   Problem List Items Addressed This Visit    Chronic back pain    Has known scoliosis.  Has been evaluated by surgery and multiple specialists.  Not a surgical candidate.        Chronic kidney disease (CKD), stage III (moderate)    Seeing nephrology.  Cr just checked -  1.56.        Endometrial cancer    Has been seeing Dr Ouida Sills.  Overdue f/u.  Discussed with her today.  She declines f/u at this time.        GERD (gastroesophageal reflux disease)    Continue prilosec.        Health care maintenance    Physical 10/27/14.   Discussed mammogram.  She declines.  Colonoscopy 06/04/10 - diverticulosis.        Hypercholesterolemia  On simvastatin.  Low cholesterol diet and exercise.  Follow lipid panel and liver function tests.   Lab Results  Component Value Date   CHOL 147 04/14/2015   HDL 64.80 04/14/2015   LDLCALC 58 04/14/2015   TRIG 120.0 04/14/2015   CHOLHDL 2 04/14/2015        Relevant Orders   Lipid panel   Hepatic function panel   Hypertension - Primary    Sees cardiology.  Continue current medication regimen.  Blood pressures stable as outlined.       Relevant Orders   Basic metabolic panel   Osteoporosis    Unable to take bisphosphonates.          I spent 25 minutes with the patient and more than 50% of the time was spent in consultation regarding the above.     Einar Pheasant, MD

## 2015-04-22 ENCOUNTER — Encounter: Payer: Self-pay | Admitting: Internal Medicine

## 2015-04-22 DIAGNOSIS — Z0000 Encounter for general adult medical examination without abnormal findings: Secondary | ICD-10-CM | POA: Insufficient documentation

## 2015-04-22 NOTE — Assessment & Plan Note (Addendum)
Unable to take bisphosphonates.

## 2015-04-22 NOTE — Assessment & Plan Note (Signed)
Seeing nephrology.  Cr just checked - 1.56.

## 2015-04-22 NOTE — Assessment & Plan Note (Addendum)
Sees cardiology.  Continue current medication regimen.  Blood pressures stable as outlined.

## 2015-04-22 NOTE — Assessment & Plan Note (Signed)
Continue prilosec

## 2015-04-22 NOTE — Assessment & Plan Note (Signed)
Physical 10/27/14.   Discussed mammogram.  She declines.  Colonoscopy 06/04/10 - diverticulosis.

## 2015-04-22 NOTE — Assessment & Plan Note (Signed)
On simvastatin.  Low cholesterol diet and exercise.  Follow lipid panel and liver function tests.   Lab Results  Component Value Date   CHOL 147 04/14/2015   HDL 64.80 04/14/2015   LDLCALC 58 04/14/2015   TRIG 120.0 04/14/2015   CHOLHDL 2 04/14/2015

## 2015-04-22 NOTE — Assessment & Plan Note (Signed)
Has been seeing Dr Ouida Sills.  Overdue f/u.  Discussed with her today.  She declines f/u at this time.

## 2015-04-22 NOTE — Assessment & Plan Note (Signed)
Has known scoliosis.  Has been evaluated by surgery and multiple specialists.  Not a surgical candidate.

## 2015-05-15 ENCOUNTER — Encounter: Payer: Self-pay | Admitting: Cardiovascular Disease

## 2015-05-15 ENCOUNTER — Ambulatory Visit (INDEPENDENT_AMBULATORY_CARE_PROVIDER_SITE_OTHER): Payer: Medicare Other | Admitting: Cardiovascular Disease

## 2015-05-15 VITALS — BP 140/80 | HR 61 | Ht 64.0 in | Wt 152.8 lb

## 2015-05-15 DIAGNOSIS — I5032 Chronic diastolic (congestive) heart failure: Secondary | ICD-10-CM | POA: Diagnosis not present

## 2015-05-15 DIAGNOSIS — I159 Secondary hypertension, unspecified: Secondary | ICD-10-CM

## 2015-05-15 DIAGNOSIS — R609 Edema, unspecified: Secondary | ICD-10-CM | POA: Diagnosis not present

## 2015-05-15 MED ORDER — HYDRALAZINE HCL 100 MG PO TABS: 100.0000 mg | ORAL_TABLET | Freq: Four times a day (QID) | ORAL | Status: DC

## 2015-05-15 NOTE — Assessment & Plan Note (Signed)
Left greater than right leg edema, likely venous insufficiency Recommended she continue wearing her compression hose She has been sitting more recently as husband has been getting his treatments likely exacerbating her symptoms

## 2015-05-15 NOTE — Assessment & Plan Note (Signed)
Recommended that she continue on her Lasix every other day for does on CHF. Current edema more noticeable today likely from more sitting, venous insufficiency

## 2015-05-15 NOTE — Patient Instructions (Signed)
You are doing well. No medication changes were made.  Consider wearing compression hose  Please call us if you have new issues that need to be addressed before your next appt.  Your physician wants you to follow-up in: 6 months.  You will receive a reminder letter in the mail two months in advance. If you don't receive a letter, please call our office to schedule the follow-up appointment.

## 2015-05-15 NOTE — Progress Notes (Signed)
[name], female    DOB: 04/05/1927, 81 y.o.   MRN: [id number]  HPI Comments: Ms. Jill Castro is an 81 yo pleasant woman with history of HTN and diastolic CHF, chronic lower extremity edema (left leg in particular), presents for followup of her hypertension and diastolic CHF  She has severe scoliosis and chronic back pain and requires pain medication.  Husband recently diagnosed with pancreatic cancer, he has started chemotherapy and radiation treatment at [facility]  In follow-up today, she continues to have severe back pain for which she takes Tylenol She has noticed some increasing lower extremity edema, worse on the left than the right. She has been sitting more as husband has been getting his treatments for cancer Blood pressure doing well on her current medication regimen She is doing hydralazine 100 mg in the morning and before bed, 50 mg at dinner. Systolic pressure typically 130 up to 140 She's not very active at baseline, no regular exercise program. She continues to take Lasix 40 mg every other day.   EKG on today's visit shows normal sinus rhythm with rate 61 bpm, no significant ST or T-wave changes  Other past medical history Chronic back pain, not a candidate for back surgery Continued eye problem, macular degeneration. Baseline Creatinine 1.4 Tries to wear compression hose when she can.   catheterization in 2003 showing luminal irregularities,  negative stress test in October 2010 with no ischemia, echocardiogram August 2010 showing normal systolic function, mild to moderate tricuspid regurgitation, mildly elevated right ventricular systolic pressures consistent with mild pulmonary hypertension, ejection fraction 60%.  Allergies  Allergen Reactions  . Benicar [Olmesartan] Other (See Comments)    Lip swelling  . Celebrex [Celecoxib]   . Penicillins     Outpatient Encounter Prescriptions as of 05/15/2015  Medication Sig  . aspirin 81 MG EC tablet Take 81 mg  by mouth daily.    . B Complex Vitamins (PA B-COMPLEX WITH B-12) TABS Take 1 tablet by mouth daily.    . brimonidine (ALPHAGAN P) 0.1 % SOLN Place 1 drop into the left eye 2 (two) times daily.  Marland Kitchen BYSTOLIC 5 MG tablet TAKE 1 TABLET  BY MOUTH 2 TIMES DAILY.  . Calcium Carbonate-Vitamin D (CALCIUM-VITAMIN D) 600-200 MG-UNIT CAPS Take by mouth. Takes 1/2 tablet daily.  . dorzolamide-timolol (COSOPT) 22.3-6.8 MG/ML ophthalmic solution Place 1 drop into the left eye 2 (two) times daily.   . furosemide (LASIX) 40 MG tablet TAKE 1 TABLET (40 MG TOTAL) BY MOUTH DAILY.  Marland Kitchen Glucosamine-Chondroitin 1500-1200 MG/30ML LIQD Take by mouth. Takes 1/2 tablet daily.  . hydrALAZINE (APRESOLINE) 100 MG tablet Take 1 tablet (100 mg total) by mouth 4 (four) times daily.  Marland Kitchen latanoprost (XALATAN) 0.005 % ophthalmic solution Place 1 drop into the left eye at bedtime.   Marland Kitchen losartan (COZAAR) 100 MG tablet TAKE 1 TABLET BY MOUTH DAILY.  . Magnesium 250 MG TABS Take 250 mg by mouth daily.  . multivitamin (THERAGRAN) per tablet Take 1 tablet by mouth daily.    Marland Kitchen omeprazole (PRILOSEC) 20 MG capsule TAKE 1 CAPSULE  (20 MG TOTAL) BY MOUTH DAILY.  . Polyethylene Glycol 3350 (MIRALAX PO) Take by mouth as needed.   . ranitidine (ZANTAC) 75 MG tablet Take 75 mg by mouth at bedtime.  . simvastatin (ZOCOR) 10 MG tablet TAKE 1 TABLET  BY MOUTH AT BEDTIME.  . vitamin C (ASCORBIC ACID) 500 MG tablet Take 500 mg by mouth daily.    . [DISCONTINUED]  hydrALAZINE (APRESOLINE) 100 MG tablet Take 1 tablet (100 mg total) by mouth 4 (four) times daily. (Patient taking differently: 100mg  (am), 50mg  (supper), 100mg  (bedtime))   No facility-administered encounter medications on file as of 05/15/2015.    Past Medical History  Diagnosis Date  . S/P cardiac catheterization 05/2002    Luminal irregularities only in the coronaries. EF 55%. There was some irregulatrity in the right renal artery suggestive of possible fibromuscular dysplasia. Myoview in  5/08 showed no ischemia or infarction. Lexiscan myoview at [facility] (10/10) showed EF 55%, normal wall motion, no evience ofor ischemia or infarction.  . Diastolic CHF, acute     Echo (8/10) with EF 60-65%, mild LVH, mild to moderate TR, PASP 40 mmHg.  Marland Kitchen HTN (hypertension)   . Dizziness     Thought to be side effect of Norvasc  . Hypercalcemia     On HCTZ  . Scoliosis associated with other condition     Chronic back pain  . GERD (gastroesophageal reflux disease)   . Uterine cancer     S/P hypsterectomy in 2008  . Hyperkalemia   . Macular degeneration   . Renal insufficiency   . Hypercholesterolemia   . Osteoporosis   . Renal fibromuscular dysplasia   . Glaucoma     Past Surgical History  Procedure Laterality Date  . Cardiac catheterization      Left  . Refractive surgery    . Total abdominal hysterectomy w/ bilateral salpingoophorectomy      stage I C well differentiated adenocarcinoma  . Cataract extraction Right     Social History  reports that she has never smoked. She has never used smokeless tobacco. She reports that she does not drink alcohol or use illicit drugs.  Family History family history includes Diabetes in her cousin; Heart disease in her father and mother; Parkinson's disease in her mother.    Review of Systems  Constitutional: Negative.   Respiratory: Negative.   Cardiovascular: Positive for leg swelling.       Left leg greater than right leg  Gastrointestinal: Negative.   Musculoskeletal: Positive for back pain and gait problem.  Neurological: Negative.   Hematological: Negative.   Psychiatric/Behavioral: Negative.   All other systems reviewed and are negative.   BP 140/80 mmHg  Pulse 61  Ht 5\' 4"  (1.626 m)  Wt 152 lb 12 oz (69.287 kg)  BMI 26.21 kg/m2  Physical Exam  Constitutional: She is oriented to person, place, and time. She appears well-developed and well-nourished.  Frail  HENT:  Head: Normocephalic.  Nose: Nose normal.   Mouth/Throat: Oropharynx is clear and moist.  Eyes: Conjunctivae are normal. Pupils are equal, round, and reactive to light.  Neck: Normal range of motion. Neck supple. No JVD present.  Cardiovascular: Normal rate, regular rhythm, S1 normal, S2 normal, normal heart sounds and intact distal pulses.  Exam reveals no gallop and no friction rub.   No murmur heard. Left foot and ankle greater than right leg swelling, nonpitting  Pulmonary/Chest: Effort normal and breath sounds normal. No respiratory distress. She has no wheezes. She has no rales. She exhibits no tenderness.  Abdominal: Soft. Bowel sounds are normal. She exhibits no distension. There is no tenderness.  Musculoskeletal: Normal range of motion. She exhibits no edema or tenderness.  Lymphadenopathy:    She has no cervical adenopathy.  Neurological: She is alert and oriented to person, place, and time. Coordination normal.  Skin: Skin is warm and dry. No rash noted. No  erythema.  Psychiatric: She has a normal mood and affect. Her behavior is normal. Judgment and thought content normal.    Assessment and Plan  Nursing note and vitals reviewed.

## 2015-05-15 NOTE — Assessment & Plan Note (Signed)
Blood pressure is well controlled on today's visit. No changes made to the medications. 

## 2015-05-22 ENCOUNTER — Ambulatory Visit: Payer: Self-pay | Admitting: Cardiovascular Disease

## 2015-06-16 ENCOUNTER — Encounter: Payer: Self-pay | Admitting: Internal Medicine

## 2015-06-18 ENCOUNTER — Telehealth: Payer: Self-pay

## 2015-06-18 NOTE — Telephone Encounter (Signed)
I can see her on 06/30/15 - 12:00.  Let me know if this is a problem.

## 2015-06-18 NOTE — Telephone Encounter (Signed)
The pt's husband called and stated he wanted the pt to see Dr.Scott instead of a referral to a nuero dr or an othro dr.

## 2015-06-30 ENCOUNTER — Ambulatory Visit (INDEPENDENT_AMBULATORY_CARE_PROVIDER_SITE_OTHER): Payer: Medicare Other | Admitting: Internal Medicine

## 2015-06-30 ENCOUNTER — Encounter: Payer: Self-pay | Admitting: Internal Medicine

## 2015-06-30 VITALS — BP 163/74 | HR 58 | Temp 98.0°F | Ht 64.0 in | Wt 146.1 lb

## 2015-06-30 DIAGNOSIS — G8929 Other chronic pain: Secondary | ICD-10-CM

## 2015-06-30 DIAGNOSIS — I159 Secondary hypertension, unspecified: Secondary | ICD-10-CM | POA: Diagnosis not present

## 2015-06-30 DIAGNOSIS — N183 Chronic kidney disease, stage 3 unspecified: Secondary | ICD-10-CM

## 2015-06-30 DIAGNOSIS — K2100 Gastro-esophageal reflux disease with esophagitis, without bleeding: Secondary | ICD-10-CM

## 2015-06-30 DIAGNOSIS — M549 Dorsalgia, unspecified: Secondary | ICD-10-CM | POA: Diagnosis not present

## 2015-06-30 NOTE — Progress Notes (Signed)
Pre visit review using our clinic review tool, if applicable. No additional management support is needed unless otherwise documented below in the visit note. 

## 2015-06-30 NOTE — Progress Notes (Signed)
[name], female   DOB: 02/03/1927, 81 y.o.   MRN: [id number]   Subjective:    [name], female    DOB: [birth year], 81 y.o.   MRN: [id number]  HPI  Patient here as a work in to discuss treatment options for her back.  Has seen neurosurgery and orthopedics previously.  Has known scoliosis and degenerative change.  Increased pain.  Limiting her activity.  Has to sit and rest with minimal activity.  Back limits her.  Unable to tolerate pain medication.  Takes tylenol.  Unable to take antiinflammatories.  She is interested in follow up to discuss any options to help with pain control.  Breathing stable.  Sees Dr Rockey Situ.  Heart doing well.  Blood pressure has been under better control.  Increased stress with her husband's health issues.  She is accompanied by her husband.  History obtained from both of them.     Past Medical History  Diagnosis Date  . S/P cardiac catheterization 05/2002    Luminal irregularities only in the coronaries. EF 55%. There was some irregulatrity in the right renal artery suggestive of possible fibromuscular dysplasia. Myoview in 5/08 showed no ischemia or infarction. Lexiscan my[facility]iew at [facility] (10/10) showed EF 55%, normal wall motion, no evience ofor ischemia or infarction.  . Diastolic CHF, acute     Echo (8/10) with EF 60-65%, mild LVH, mild to moderate TR, PASP 40 mmHg.  Marland Kitchen HTN (hypertension)   . Dizziness     Thought to be side effect of Norvasc  . Hypercalcemia     On HCTZ  . Scoliosis associated with other condition     Chronic back pain  . GERD (gastroesophageal reflux disease)   . Uterine cancer     S/P hypsterectomy in 2008  . Hyperkalemia   . Macular degeneration   . Renal insufficiency   . Hypercholesterolemia   . Osteoporosis   . Renal fibromuscular dysplasia   . Glaucoma     Outpatient Encounter Prescriptions as of 06/30/2015  Medication Sig  . aspirin 81 MG EC tablet Take 81 mg by mouth daily.    . B Complex  Vitamins (PA B-COMPLEX WITH B-12) TABS Take 1 tablet by mouth daily.    . brimonidine (ALPHAGAN P) 0.1 % SOLN Place 1 drop into the left eye 2 (two) times daily.  Marland Kitchen BYSTOLIC 5 MG tablet TAKE 1 TABLET  BY MOUTH 2 TIMES DAILY.  . Calcium Carbonate-Vitamin D (CALCIUM-VITAMIN D) 600-200 MG-UNIT CAPS Take by mouth. Takes 1/2 tablet daily.  . dorzolamide-timolol (COSOPT) 22.3-6.8 MG/ML ophthalmic solution Place 1 drop into the left eye 2 (two) times daily.   . furosemide (LASIX) 40 MG tablet TAKE 1 TABLET (40 MG TOTAL) BY MOUTH DAILY.  Marland Kitchen Glucosamine-Chondroitin 1500-1200 MG/30ML LIQD Take by mouth. Takes 1/2 tablet daily.  . hydrALAZINE (APRESOLINE) 100 MG tablet Take 1 tablet (100 mg total) by mouth 4 (four) times daily. (Patient taking differently: Take 250 mg by mouth daily. 100mg -breakfast, 50mg -dinner, 100mg -bedtime)  . latanoprost (XALATAN) 0.005 % ophthalmic solution Place 1 drop into the left eye at bedtime.   Marland Kitchen losartan (COZAAR) 100 MG tablet TAKE 1 TABLET BY MOUTH DAILY.  . Magnesium 250 MG TABS Take 250 mg by mouth daily.  . multivitamin (THERAGRAN) per tablet Take 1 tablet by mouth daily.    Marland Kitchen omeprazole (PRILOSEC) 20 MG capsule TAKE 1 CAPSULE  (20 MG TOTAL) BY MOUTH DAILY.  . Polyethylene Glycol 3350 (MIRALAX PO)  Take by mouth as needed.   . ranitidine (ZANTAC) 75 MG tablet Take 75 mg by mouth at bedtime.  . simvastatin (ZOCOR) 10 MG tablet TAKE 1 TABLET  BY MOUTH AT BEDTIME.  . vitamin C (ASCORBIC ACID) 500 MG tablet Take 500 mg by mouth daily.    Marland Kitchen lidocaine-prilocaine (EMLA) cream Apply 1 application topically as needed.   No facility-administered encounter medications on file as of 06/30/2015.    Review of Systems  Constitutional: Negative for fever.       Weight is down.  Not eating as much.  Husband has been sick.  Not eating because he is not eating.    HENT: Negative for congestion and sinus pressure.   Respiratory: Negative for cough, chest tightness and shortness of breath.     Cardiovascular: Negative for chest pain, palpitations and leg swelling.  Gastrointestinal: Negative for nausea, vomiting, abdominal pain and diarrhea.  Genitourinary: Negative for dysuria and difficulty urinating.  Musculoskeletal: Positive for back pain (increased.  limits activity.  has to sit and rest. ). Negative for joint swelling.  Skin: Negative for color change and rash.  Neurological: Negative for dizziness, light-headedness and headaches.  Psychiatric/Behavioral: Negative for dysphoric mood and agitation.       Objective:    Physical Exam  Constitutional: No distress.  Neck: Neck supple.  Cardiovascular: Normal rate and regular rhythm.   Pulmonary/Chest: Breath sounds normal. No respiratory distress. She has no wheezes.  Abdominal: Soft. Bowel sounds are normal. There is no tenderness.  Musculoskeletal: She exhibits no edema or tenderness.  Lymphadenopathy:    She has no cervical adenopathy.  Skin: No rash noted. No erythema.  Psychiatric: She has a normal mood and affect. Her behavior is normal.    BP 163/74 mmHg  Pulse 58  Temp(Src) 98 F (36.7 C) (Oral)  Ht 5\' 4"  (1.626 m)  Wt 146 lb 2 oz (66.282 kg)  BMI 25.07 kg/m2  SpO2 96% Wt Readings from Last 3 Encounters:  06/30/15 146 lb 2 oz (66.282 kg)  05/15/15 152 lb 12 oz (69.287 kg)  04/21/15 151 lb 8 oz (68.72 kg)     Lab Results  Component Value Date   WBC 7.4 02/03/2015   HGB 12.6 02/03/2015   HCT 37.1 02/03/2015   PLT 211.0 02/03/2015   GLUCOSE 104* 04/14/2015   CHOL 147 04/14/2015   TRIG 120.0 04/14/2015   HDL 64.80 04/14/2015   LDLCALC 58 04/14/2015   ALT 17 04/14/2015   AST 23 04/14/2015   NA 137 04/14/2015   K 4.8 04/14/2015   CL 107 04/14/2015   CREATININE 1.56* 04/14/2015   BUN 37* 04/14/2015   CO2 24 04/14/2015   TSH 3.59 02/03/2015       Assessment & Plan:   Problem List Items Addressed This Visit    Chronic back pain - Primary    Worsening.  Limits her activity.  Has seen  ortho and nsu previously.  Would like to be referred back to discuss treatment options.  Refer to Dr Glenna Fellows.  Has seen previously.        Relevant Orders   Ambulatory referral to Neurosurgery   Chronic kidney disease (CKD), stage III (moderate)    Followed by nephrology.  Cr has been stable.  Avoid antiinflammatories.       GERD (gastroesophageal reflux disease)    Continue prilosec.  Upper symptoms controlled.  Unable to take antiinflammatories.       Hypertension  Blood pressure has been doing well.  Increased pain and stress.  Have her spot check her pressure.  Notify me if persistent elevation.         I spent 25 minutes with the patient and more than 50% of the time was spent in consultation regarding the above.     Einar Pheasant, MD

## 2015-07-01 ENCOUNTER — Encounter: Payer: Self-pay | Admitting: Internal Medicine

## 2015-07-01 MED ORDER — LIDOCAINE-PRILOCAINE 2.5-2.5 % EX CREA: 1.0000 "application " | TOPICAL_CREAM | CUTANEOUS | Status: DC | PRN

## 2015-07-01 NOTE — Assessment & Plan Note (Signed)
Followed by nephrology.  Cr has been stable.  Avoid antiinflammatories.

## 2015-07-01 NOTE — Assessment & Plan Note (Signed)
Continue prilosec.  Upper symptoms controlled.  Unable to take antiinflammatories.

## 2015-07-01 NOTE — Assessment & Plan Note (Signed)
Worsening.  Limits her activity.  Has seen ortho and nsu previously.  Would like to be referred back to discuss treatment options.  Refer to Dr Glenna Fellows.  Has seen previously.

## 2015-07-01 NOTE — Assessment & Plan Note (Signed)
Blood pressure has been doing well.  Increased pain and stress.  Have her spot check her pressure.  Notify me if persistent elevation.

## 2015-07-23 ENCOUNTER — Telehealth: Payer: Self-pay | Admitting: Internal Medicine

## 2015-07-23 NOTE — Telephone Encounter (Signed)
-----   Message from [facility] sent at 07/23/2015  9:12 AM EDT ----- Regarding: Referral Called to check the status of referral and was advised that since the pt has not been seen by Dr. Rex Kras office in over 5 yrs, Dr. Carloyn Manner is requesting a recent MRI or CT done within the last 6 months before the patient can be seen. Please advise   meme

## 2015-07-23 NOTE — Telephone Encounter (Signed)
Please notify pt that Dr Rex Kras office called back and stated that since it had been more than five years since evaluated by Dr Roy's office, that she needed MRI prior to being seen.  Is she agreeable to schedule.  If so, let me know and I will place the order for the scan.

## 2015-07-24 ENCOUNTER — Encounter: Payer: Self-pay | Admitting: *Deleted

## 2015-07-24 DIAGNOSIS — G8929 Other chronic pain: Secondary | ICD-10-CM

## 2015-07-24 DIAGNOSIS — M549 Dorsalgia, unspecified: Principal | ICD-10-CM

## 2015-07-24 NOTE — Telephone Encounter (Signed)
Pt advise that she is okay with having an MRI prior to appt with Dr. Carloyn Manner. Pt request to have MRI without the use of dye.msn

## 2015-07-24 NOTE — Telephone Encounter (Signed)
LM to check mychart message (message sent)

## 2015-07-25 NOTE — Telephone Encounter (Signed)
I have placed the order for the MRI.  When scheduling, need to make sure is done without contrast.  Pt has chronic kidney disease.  Also please confirm with pt that she has never been told that she cannot have MRI ( confirm no metal, pacemaker, etc).

## 2015-07-28 NOTE — Telephone Encounter (Signed)
See attached note.  Thanks

## 2015-07-30 ENCOUNTER — Ambulatory Visit
Admission: RE | Admit: 2015-07-30 | Discharge: 2015-07-30 | Disposition: A | Discharge status: Home / Self Care | Payer: Medicare Other | Source: Ambulatory Visit | Attending: Internal Medicine | Admitting: Internal Medicine

## 2015-07-30 DIAGNOSIS — M4805 Spinal stenosis, thoracolumbar region: Secondary | ICD-10-CM | POA: Insufficient documentation

## 2015-07-30 DIAGNOSIS — G8929 Other chronic pain: Secondary | ICD-10-CM | POA: Diagnosis present

## 2015-07-30 DIAGNOSIS — M545 Low back pain: Secondary | ICD-10-CM | POA: Diagnosis present

## 2015-07-30 DIAGNOSIS — M419 Scoliosis, unspecified: Secondary | ICD-10-CM | POA: Insufficient documentation

## 2015-07-30 DIAGNOSIS — M47896 Other spondylosis, lumbar region: Secondary | ICD-10-CM | POA: Diagnosis not present

## 2015-07-30 DIAGNOSIS — M549 Dorsalgia, unspecified: Secondary | ICD-10-CM

## 2015-07-30 DIAGNOSIS — M4804 Spinal stenosis, thoracic region: Secondary | ICD-10-CM | POA: Insufficient documentation

## 2015-07-31 ENCOUNTER — Encounter: Payer: Self-pay | Admitting: *Deleted

## 2015-08-12 ENCOUNTER — Other Ambulatory Visit: Payer: Self-pay | Admitting: Cardiovascular Disease

## 2015-08-15 ENCOUNTER — Other Ambulatory Visit: Payer: Self-pay | Admitting: Internal Medicine

## 2015-08-25 ENCOUNTER — Other Ambulatory Visit (INDEPENDENT_AMBULATORY_CARE_PROVIDER_SITE_OTHER): Payer: Medicare Other

## 2015-08-25 DIAGNOSIS — I159 Secondary hypertension, unspecified: Secondary | ICD-10-CM | POA: Diagnosis not present

## 2015-08-25 DIAGNOSIS — E7800 Pure hypercholesterolemia, unspecified: Secondary | ICD-10-CM

## 2015-08-25 LAB — HEPATIC FUNCTION PANEL
ALBUMIN: 4.2 g/dL (ref 3.5–5.2)
ALT: 16 U/L (ref 0–35)
AST: 24 U/L (ref 0–37)
Alkaline Phosphatase: 66 U/L (ref 39–117)
Bilirubin, Direct: 0.1 mg/dL (ref 0.0–0.3)
TOTAL PROTEIN: 7.1 g/dL (ref 6.0–8.3)
Total Bilirubin: 0.5 mg/dL (ref 0.2–1.2)

## 2015-08-25 LAB — BASIC METABOLIC PANEL
BUN: 30 mg/dL — ABNORMAL HIGH (ref 6–23)
CHLORIDE: 105 meq/L (ref 96–112)
CO2: 25 meq/L (ref 19–32)
Calcium: 10.1 mg/dL (ref 8.4–10.5)
Creatinine, Ser: 1.46 mg/dL — ABNORMAL HIGH (ref 0.40–1.20)
GFR: 36.06 mL/min — ABNORMAL LOW (ref >60.00)
Glucose, Bld: 108 mg/dL — ABNORMAL HIGH (ref 70–99)
POTASSIUM: 4.4 meq/L (ref 3.5–5.1)
SODIUM: 138 meq/L (ref 135–145)

## 2015-08-25 LAB — LIPID PANEL
CHOL/HDL RATIO: 2
Cholesterol: 139 mg/dL (ref 0–200)
HDL: 64.1 mg/dL (ref >39.00)
LDL CALC: 54 mg/dL (ref 0–99)
NONHDL: 75.18
TRIGLYCERIDES: 105 mg/dL (ref 0.0–149.0)
VLDL: 21 mg/dL (ref 0.0–40.0)

## 2015-08-26 ENCOUNTER — Encounter: Payer: Self-pay | Admitting: Internal Medicine

## 2015-08-27 ENCOUNTER — Encounter: Payer: Self-pay | Admitting: Internal Medicine

## 2015-08-27 ENCOUNTER — Ambulatory Visit (INDEPENDENT_AMBULATORY_CARE_PROVIDER_SITE_OTHER): Payer: Medicare Other | Admitting: Internal Medicine

## 2015-08-27 VITALS — BP 120/76 | HR 59 | Temp 97.9°F | Ht 64.0 in | Wt 144.8 lb

## 2015-08-27 DIAGNOSIS — N183 Chronic kidney disease, stage 3 unspecified: Secondary | ICD-10-CM

## 2015-08-27 DIAGNOSIS — K2100 Gastro-esophageal reflux disease with esophagitis, without bleeding: Secondary | ICD-10-CM

## 2015-08-27 DIAGNOSIS — E7800 Pure hypercholesterolemia, unspecified: Secondary | ICD-10-CM

## 2015-08-27 DIAGNOSIS — C541 Malignant neoplasm of endometrium: Secondary | ICD-10-CM

## 2015-08-27 DIAGNOSIS — I159 Secondary hypertension, unspecified: Secondary | ICD-10-CM | POA: Diagnosis not present

## 2015-08-27 DIAGNOSIS — E875 Hyperkalemia: Secondary | ICD-10-CM

## 2015-08-27 DIAGNOSIS — M549 Dorsalgia, unspecified: Secondary | ICD-10-CM

## 2015-08-27 DIAGNOSIS — L989 Disorder of the skin and subcutaneous tissue, unspecified: Secondary | ICD-10-CM

## 2015-08-27 DIAGNOSIS — J3489 Other specified disorders of nose and nasal sinuses: Secondary | ICD-10-CM

## 2015-08-27 DIAGNOSIS — G8929 Other chronic pain: Secondary | ICD-10-CM

## 2015-08-27 DIAGNOSIS — R609 Edema, unspecified: Secondary | ICD-10-CM

## 2015-08-27 NOTE — Progress Notes (Signed)
[name], female   DOB: 25-Jul-1927, 81 y.o.   MRN: [id number]   Subjective:    [name], female    DOB: 1927-06-14, 81 y.o.   MRN: [id number]  HPI  Patient with past history of hypertension, GERD, CKD and hypercholesterolemia who comes in today to follow up on these issues.  She has had a cold for a few weeks.  Took zinc.  Is better.  Some nasal stuffiness and drainage.  No chest congestion.  No nausea or vomiting.  Takes miralax.  This helps her bowels.  She has a persistent left temple lesion.  Burn.  States is better.  Still present.  No headache.  Weight is down.  States she has been eating regular meals. Handling stress relatively well.     Past Medical History  Diagnosis Date  . S/P cardiac catheterization 05/2002    Luminal irregularities only in the coronaries. EF 55%. There was some irregulatrity in the right renal artery suggestive of possible fibromuscular dysplasia. Myoview in 5/08 showed no ischemia or infarction. Lexiscan myoview at [facility] (10/10) showed EF 55%, normal wall motion, no evience ofor ischemia or infarction.  . Diastolic CHF, acute ([place])     Echo (8/10) with EF 60-65%, mild LVH, mild to moderate TR, PASP 40 mmHg.  Marland Kitchen HTN (hypertension)   . Dizziness     Thought to be side effect of Norvasc  . Hypercalcemia     On HCTZ  . Scoliosis associated with other condition     Chronic back pain  . GERD (gastroesophageal reflux disease)   . Uterine cancer ([place])     S/P hypsterectomy in 2008  . Hyperkalemia   . Macular degeneration   . Renal insufficiency   . Hypercholesterolemia   . Osteoporosis   . Renal fibromuscular dysplasia ([place])   . Glaucoma    Past Surgical History  Procedure Laterality Date  . Cardiac catheterization      Left  . Refractive surgery    . Total abdominal hysterectomy w/ bilateral salpingoophorectomy      stage I C well differentiated adenocarcinoma  . Cataract extraction Right    Family History    Problem Relation Age of Onset  . Heart disease Father   . Heart disease Mother   . Parkinson's disease Mother   . Diabetes Cousin    Social History   Social History  . Marital Status: Married    Spouse Name: N/A  . Number of Children: 2  . Years of Education: N/A   Social History Main Topics  . Smoking status: Never Smoker   . Smokeless tobacco: Never Used  . Alcohol Use: No  . Drug Use: No  . Sexual Activity: Not Asked   Other Topics Concern  . None   Social History Narrative   Lives with husband in [place]    Outpatient Encounter Prescriptions as of 08/27/2015  Medication Sig  . aspirin 81 MG EC tablet Take 81 mg by mouth daily.    . B Complex Vitamins (PA B-COMPLEX WITH B-12) TABS Take 1 tablet by mouth daily.    . brimonidine (ALPHAGAN P) 0.1 % SOLN Place 1 drop into the left eye 2 (two) times daily.  Marland Kitchen BYSTOLIC 5 MG tablet TAKE 1 TABLET  BY MOUTH 2 TIMES DAILY.  . Calcium Carbonate-Vitamin D (CALCIUM-VITAMIN D) 600-200 MG-UNIT CAPS Take by mouth. Takes 1/2 tablet daily.  . dorzolamide-timolol (COSOPT) 22.3-6.8 MG/ML ophthalmic solution Place 1 drop  into the left eye 2 (two) times daily.   . furosemide (LASIX) 40 MG tablet TAKE 1 TABLET (40 MG TOTAL) BY MOUTH DAILY.  Marland Kitchen Glucosamine-Chondroitin 1500-1200 MG/30ML LIQD Take by mouth. Takes 1/2 tablet daily.  . hydrALAZINE (APRESOLINE) 100 MG tablet Take 1 tablet (100 mg total) by mouth 4 (four) times daily. (Patient taking differently: Take 250 mg by mouth daily. 100mg -breakfast, 50mg -dinner, 100mg -bedtime)  . latanoprost (XALATAN) 0.005 % ophthalmic solution Place 1 drop into the left eye at bedtime.   . lidocaine-prilocaine (EMLA) cream Apply 1 application topically as needed.  Marland Kitchen losartan (COZAAR) 100 MG tablet TAKE 1 TABLET BY MOUTH DAILY.  . Magnesium 250 MG TABS Take 250 mg by mouth daily.  . multivitamin (THERAGRAN) per tablet Take 1 tablet by mouth daily.    Marland Kitchen omeprazole (PRILOSEC) 20 MG capsule TAKE 1 CAPSULE   (20 MG TOTAL) BY MOUTH DAILY.  . Polyethylene Glycol 3350 (MIRALAX PO) Take by mouth as needed.   . ranitidine (ZANTAC) 75 MG tablet Take 75 mg by mouth at bedtime.  . simvastatin (ZOCOR) 10 MG tablet TAKE 1 TABLET  BY MOUTH AT BEDTIME.  . vitamin C (ASCORBIC ACID) 500 MG tablet Take 500 mg by mouth daily.     No facility-administered encounter medications on file as of 08/27/2015.    Review of Systems  Constitutional: Negative for fever and appetite change.  HENT: Negative for sinus pressure.        Minimal nasal stuffiness.  Some drainage.  Slight cough.    Eyes: Negative for pain and visual disturbance.  Respiratory: Positive for cough (slight). Negative for chest tightness and shortness of breath.   Cardiovascular: Negative for chest pain and palpitations.  Gastrointestinal: Negative for nausea, vomiting, abdominal pain and diarrhea.  Genitourinary: Negative for dysuria and difficulty urinating.  Musculoskeletal: Positive for back pain (persistent chronic back pain). Negative for joint swelling.  Skin: Negative for color change and rash.  Neurological: Negative for dizziness, light-headedness and headaches.  Psychiatric/Behavioral: Negative for dysphoric mood and agitation.       Objective:    Physical Exam  Constitutional: She appears well-developed and well-nourished. No distress.  HENT:  Nose: Nose normal.  Mouth/Throat: Oropharynx is clear and moist.  Eyes: Right eye exhibits no discharge. Left eye exhibits no discharge.  Neck: Neck supple. No thyromegaly present.  Cardiovascular: Normal rate and regular rhythm.   Pulmonary/Chest: Breath sounds normal. No respiratory distress. She has no wheezes.  Abdominal: Soft. Bowel sounds are normal. There is no tenderness.  Musculoskeletal: She exhibits no edema or tenderness.  Lymphadenopathy:    She has no cervical adenopathy.  Skin: No rash noted. No erythema.  Psychiatric: She has a normal mood and affect. Her behavior is  normal.    BP 120/76 mmHg  Pulse 59  Temp(Src) 97.9 F (36.6 C) (Oral)  Ht 5\' 4"  (1.626 m)  Wt 144 lb 12 oz (65.658 kg)  BMI 24.83 kg/m2  SpO2 97% Wt Readings from Last 3 Encounters:  08/27/15 144 lb 12 oz (65.658 kg)  06/30/15 146 lb 2 oz (66.282 kg)  05/15/15 152 lb 12 oz (69.287 kg)     Lab Results  Component Value Date   WBC 7.4 02/03/2015   HGB 12.6 02/03/2015   HCT 37.1 02/03/2015   PLT 211.0 02/03/2015   GLUCOSE 108* 08/25/2015   CHOL 139 08/25/2015   TRIG 105.0 08/25/2015   HDL 64.10 08/25/2015   LDLCALC 54 08/25/2015   ALT 16 08/25/2015  AST 24 08/25/2015   NA 138 08/25/2015   K 4.4 08/25/2015   CL 105 08/25/2015   CREATININE 1.46* 08/25/2015   BUN 30* 08/25/2015   CO2 25 08/25/2015   TSH 3.59 02/03/2015    Mr Lumbar Spine Wo Contrast  07/30/2015   CLINICAL DATA:  Chronic back pain.  Worsening 1 month ago.  EXAM: MRI LUMBAR SPINE WITHOUT CONTRAST  TECHNIQUE: Multiplanar, multisequence MR imaging of the lumbar spine was performed. No intravenous contrast was administered.  COMPARISON:  05/17/2011  FINDINGS: Severe scoliosis and degenerative lumbar spondylosis. Multilevel disc disease and facet disease appears relatively stable. There is a L3 fracture noted with low T1 and high STIR signal intensity. No significant compression. No retropulsion.  The conus medullaris terminates at the bottom of L1. Significant facet disease but no obvious pars defects.  No significant paraspinal or retroperitoneal findings. Bilateral renal cysts are noted.  T12-L1: Diffuse bulging degenerated annulus and moderate facet disease contributing to mild spinal and bilateral lateral recess stenosis. No foraminal stenosis.  L1-2: Advanced degenerative disc disease and facet disease. There is significant left lateral recess stenosis which appears stable. This likely affects the left L2 nerve root.  L2-3: Bulging annulus, osteophytic ridging and facet disease with mild bilateral lateral recess  stenosis. No significant spinal stenosis or foraminal stenosis.  L3-4: Diffuse annular bulge, osteophytic ridging and facet disease but no significant spinal or foraminal stenosis.  L4-5:  No significant findings.  L5-S1: Advanced facet disease but no disc protrusions, spinal or foraminal stenosis.  IMPRESSION: Scoliosis and degenerative lumbar spondylosis.  Mild spinal and bilateral lateral recess stenosis at T12-L1.  Stable significant left lateral recess stenosis at L1-2.  Mild bilateral lateral recess stenosis at L2-3.   Electronically Signed   By: Marijo Sanes M.D.   On: 07/30/2015 15:35       Assessment & Plan:   Problem List Items Addressed This Visit    Chronic back pain    Limits her activity.  MRI as outlined.  Referred to Dr Carloyn Manner.        Chronic kidney disease (CKD), stage III (moderate)    Followed by nephrology.  Cr has been stable.  Just checked - 1.4.        Drainage from nose    Some nasal congestion and drainage.  Steroid nasal spray and saline nasal spray as directed.  Follow.  Will return for pneumovax.        EDEMA    Stable.  Continue compression hose.        Endometrial cancer [facility])    Sees Dr Ouida Sills.  She has declined recent follow up.        Facial lesion    Persistent lesion - left temple.  Is better.  States had a burn.  Follow.        GERD (gastroesophageal reflux disease)    On prilosec.  Upper symptoms controlled.        Hypercholesterolemia    Low cholesterol diet and exercise.  On simvastatin.  Follow lipid panel and liver function tests.   Lab Results  Component Value Date   CHOL 139 08/25/2015   HDL 64.10 08/25/2015   LDLCALC 54 08/25/2015   TRIG 105.0 08/25/2015   CHOLHDL 2 08/25/2015        Hyperkalemia    Recent potassium wnl.        Hypertension - Primary    Blood pressure under good control.  Continue same medication regimen.  Follow pressures.  Follow metabolic panel.            Einar Pheasant, MD

## 2015-08-27 NOTE — Progress Notes (Signed)
Pre-visit discussion using our clinic review tool. No additional management support is needed unless otherwise documented below in the visit note.  

## 2015-08-29 ENCOUNTER — Other Ambulatory Visit: Payer: Self-pay | Admitting: Cardiovascular Disease

## 2015-08-30 ENCOUNTER — Encounter: Payer: Self-pay | Admitting: Internal Medicine

## 2015-08-30 DIAGNOSIS — L989 Disorder of the skin and subcutaneous tissue, unspecified: Secondary | ICD-10-CM | POA: Insufficient documentation

## 2015-08-30 NOTE — Assessment & Plan Note (Signed)
Recent potassium wnl.

## 2015-08-30 NOTE — Assessment & Plan Note (Signed)
Blood pressure under good control.  Continue same medication regimen.  Follow pressures.  Follow metabolic panel.   

## 2015-08-30 NOTE — Assessment & Plan Note (Signed)
Sees Dr Ouida Sills.  She has declined recent follow up.

## 2015-08-30 NOTE — Assessment & Plan Note (Signed)
Limits her activity.  MRI as outlined.  Referred to Dr Carloyn Manner.

## 2015-08-30 NOTE — Assessment & Plan Note (Signed)
Persistent lesion - left temple.  Is better.  States had a burn.  Follow.

## 2015-08-30 NOTE — Assessment & Plan Note (Signed)
Stable.  Continue compression hose.

## 2015-08-30 NOTE — Assessment & Plan Note (Signed)
Followed by nephrology.  Cr has been stable.  Just checked - 1.4.

## 2015-08-30 NOTE — Assessment & Plan Note (Addendum)
Some nasal congestion and drainage.  Steroid nasal spray and saline nasal spray as directed.  Follow.  Will return for pneumovax.

## 2015-08-30 NOTE — Assessment & Plan Note (Signed)
On prilosec.  Upper symptoms controlled.   

## 2015-08-30 NOTE — Assessment & Plan Note (Addendum)
Low cholesterol diet and exercise.  On simvastatin.  Follow lipid panel and liver function tests.   Lab Results  Component Value Date   CHOL 139 08/25/2015   HDL 64.10 08/25/2015   LDLCALC 54 08/25/2015   TRIG 105.0 08/25/2015   CHOLHDL 2 08/25/2015

## 2015-09-08 ENCOUNTER — Other Ambulatory Visit: Payer: Medicare Other

## 2015-09-10 ENCOUNTER — Other Ambulatory Visit (INDEPENDENT_AMBULATORY_CARE_PROVIDER_SITE_OTHER): Payer: Medicare Other

## 2015-09-10 ENCOUNTER — Other Ambulatory Visit: Payer: Self-pay | Admitting: *Deleted

## 2015-09-10 DIAGNOSIS — Z1211 Encounter for screening for malignant neoplasm of colon: Secondary | ICD-10-CM

## 2015-09-10 LAB — FECAL OCCULT BLOOD, IMMUNOCHEMICAL: FECAL OCCULT BLD: NEGATIVE

## 2015-09-11 ENCOUNTER — Encounter: Payer: Self-pay | Admitting: Internal Medicine

## 2015-09-14 NOTE — Telephone Encounter (Signed)
Unread mychart message mailed to patient 

## 2015-10-08 ENCOUNTER — Other Ambulatory Visit: Payer: Self-pay | Admitting: Neurosurgery

## 2015-10-08 DIAGNOSIS — S32000A Wedge compression fracture of unspecified lumbar vertebra, initial encounter for closed fracture: Secondary | ICD-10-CM

## 2015-10-13 ENCOUNTER — Ambulatory Visit
Admission: RE | Admit: 2015-10-13 | Discharge: 2015-10-13 | Disposition: A | Discharge status: Home / Self Care | Payer: Medicare Other | Source: Ambulatory Visit | Attending: Neurosurgery | Admitting: Neurosurgery

## 2015-10-13 DIAGNOSIS — K449 Diaphragmatic hernia without obstruction or gangrene: Secondary | ICD-10-CM | POA: Diagnosis not present

## 2015-10-13 DIAGNOSIS — S32000A Wedge compression fracture of unspecified lumbar vertebra, initial encounter for closed fracture: Secondary | ICD-10-CM | POA: Diagnosis present

## 2015-10-13 DIAGNOSIS — M4806 Spinal stenosis, lumbar region: Secondary | ICD-10-CM | POA: Diagnosis not present

## 2015-10-13 DIAGNOSIS — M4126 Other idiopathic scoliosis, lumbar region: Secondary | ICD-10-CM | POA: Diagnosis not present

## 2015-10-13 DIAGNOSIS — X58XXXA Exposure to other specified factors, initial encounter: Secondary | ICD-10-CM | POA: Insufficient documentation

## 2015-10-13 DIAGNOSIS — I700 Atherosclerosis of aorta: Secondary | ICD-10-CM | POA: Diagnosis not present

## 2015-10-13 DIAGNOSIS — M8580 Other specified disorders of bone density and structure, unspecified site: Secondary | ICD-10-CM | POA: Insufficient documentation

## 2015-11-12 ENCOUNTER — Ambulatory Visit (INDEPENDENT_AMBULATORY_CARE_PROVIDER_SITE_OTHER): Payer: Medicare Other | Admitting: Internal Medicine

## 2015-11-12 ENCOUNTER — Encounter: Payer: Self-pay | Admitting: Internal Medicine

## 2015-11-12 VITALS — BP 140/80 | HR 65 | Temp 97.6°F | Resp 18 | Ht 64.0 in | Wt 144.1 lb

## 2015-11-12 DIAGNOSIS — M549 Dorsalgia, unspecified: Secondary | ICD-10-CM

## 2015-11-12 DIAGNOSIS — N183 Chronic kidney disease, stage 3 unspecified: Secondary | ICD-10-CM

## 2015-11-12 DIAGNOSIS — K2100 Gastro-esophageal reflux disease with esophagitis, without bleeding: Secondary | ICD-10-CM

## 2015-11-12 DIAGNOSIS — E7800 Pure hypercholesterolemia, unspecified: Secondary | ICD-10-CM | POA: Diagnosis not present

## 2015-11-12 DIAGNOSIS — C541 Malignant neoplasm of endometrium: Secondary | ICD-10-CM

## 2015-11-12 DIAGNOSIS — G8929 Other chronic pain: Secondary | ICD-10-CM

## 2015-11-12 DIAGNOSIS — I159 Secondary hypertension, unspecified: Secondary | ICD-10-CM | POA: Diagnosis not present

## 2015-11-12 DIAGNOSIS — R609 Edema, unspecified: Secondary | ICD-10-CM

## 2015-11-12 LAB — CBC WITH DIFFERENTIAL/PLATELET
BASOS PCT: 0.5 % (ref 0.0–3.0)
Basophils Absolute: 0 10*3/uL (ref 0.0–0.1)
EOS PCT: 2.9 % (ref 0.0–5.0)
Eosinophils Absolute: 0.2 10*3/uL (ref 0.0–0.7)
HEMATOCRIT: 37.9 % (ref 36.0–46.0)
HEMOGLOBIN: 12.4 g/dL (ref 12.0–15.0)
Lymphocytes Relative: 26 % (ref 12.0–46.0)
Lymphs Abs: 1.9 10*3/uL (ref 0.7–4.0)
MCHC: 32.9 g/dL (ref 30.0–36.0)
MCV: 96.7 fl (ref 78.0–100.0)
MONO ABS: 0.8 10*3/uL (ref 0.1–1.0)
MONOS PCT: 10.4 % (ref 3.0–12.0)
Neutro Abs: 4.4 10*3/uL (ref 1.4–7.7)
Neutrophils Relative %: 60.2 % (ref 43.0–77.0)
Platelets: 199 10*3/uL (ref 150.0–400.0)
RBC: 3.92 Mil/uL (ref 3.87–5.11)
RDW: 13.4 % (ref 11.5–15.5)
WBC: 7.3 10*3/uL (ref 4.0–10.5)

## 2015-11-12 LAB — BASIC METABOLIC PANEL
BUN: 41 mg/dL — ABNORMAL HIGH (ref 6–23)
CALCIUM: 9.7 mg/dL (ref 8.4–10.5)
CHLORIDE: 104 meq/L (ref 96–112)
CO2: 25 meq/L (ref 19–32)
Creatinine, Ser: 1.44 mg/dL — ABNORMAL HIGH (ref 0.40–1.20)
GFR: 36.62 mL/min — ABNORMAL LOW (ref >60.00)
GLUCOSE: 106 mg/dL — AB (ref 70–99)
POTASSIUM: 4.6 meq/L (ref 3.5–5.1)
SODIUM: 136 meq/L (ref 135–145)

## 2015-11-12 LAB — HEPATIC FUNCTION PANEL
ALBUMIN: 4.2 g/dL (ref 3.5–5.2)
ALT: 16 U/L (ref 0–35)
AST: 26 U/L (ref 0–37)
Alkaline Phosphatase: 70 U/L (ref 39–117)
BILIRUBIN TOTAL: 0.4 mg/dL (ref 0.2–1.2)
Bilirubin, Direct: 0.1 mg/dL (ref 0.0–0.3)
Total Protein: 7.3 g/dL (ref 6.0–8.3)

## 2015-11-12 LAB — LIPID PANEL
CHOL/HDL RATIO: 2
Cholesterol: 152 mg/dL (ref 0–200)
HDL: 69.4 mg/dL (ref >39.00)
LDL CALC: 61 mg/dL (ref 0–99)
NONHDL: 82.19
Triglycerides: 106 mg/dL (ref 0.0–149.0)
VLDL: 21.2 mg/dL (ref 0.0–40.0)

## 2015-11-12 LAB — TSH: TSH: 3.04 u[IU]/mL (ref 0.35–4.50)

## 2015-11-12 NOTE — Progress Notes (Signed)
[name], female   DOB: [birth year], 81 y.o.   MRN: [id number]   Subjective:    [name], female    DOB: October 18, 1927, 81 y.o.   MRN: [id number]  HPI  Patient with past history of hypercholesterolemia, uterine cancer, renal insufficiency, GERD and hypertension.  She comes in today to follow up on these issues.  Increased stress with her husband's medical issues.  She has good support from her family and friends.  Does not feel she needs anything more at this time.  No cardiac symptoms with increased activity or exertion.  No sob.  No acid reflux.  No abdominal pain or cramping.  Back pain is still an issue.  Saw Dr Carloyn Manner.  Has a "new crack" in her back.  He had discussed a day procedure.  She wants to postpone at this time.  Will let him know when agreeable.  Overall feel things are stable.     Past Medical History  Diagnosis Date  . S/P cardiac catheterization 05/2002    Luminal irregularities only in the coronaries. EF 55%. There was some irregulatrity in the right renal artery suggestive of possible fibromuscular dysplasia. Myoview in 5/08 showed no ischemia or infarction. Lexiscan myoview at [facility] (10/10) showed EF 55%, normal wall motion, no evience ofor ischemia or infarction.  . Diastolic CHF, acute ([place])     Echo (8/10) with EF 60-65%, mild LVH, mild to moderate TR, PASP 40 mmHg.  Marland Kitchen HTN (hypertension)   . Dizziness     Thought to be side effect of Norvasc  . Hypercalcemia     On HCTZ  . Scoliosis associated with other condition     Chronic back pain  . GERD (gastroesophageal reflux disease)   . Uterine cancer ([place])     S/P hypsterectomy in 2008  . Hyperkalemia   . Macular degeneration   . Renal insufficiency   . Hypercholesterolemia   . Osteoporosis   . Renal fibromuscular dysplasia ([place])   . Glaucoma    Past Surgical History  Procedure Laterality Date  . Cardiac catheterization      Left  . Refractive surgery    . Total abdominal  hysterectomy w/ bilateral salpingoophorectomy      stage I C well differentiated adenocarcinoma  . Cataract extraction Right    Family History  Problem Relation Age of Onset  . Heart disease Father   . Heart disease Mother   . Parkinson's disease Mother   . Diabetes Cousin    Social History   Social History  . Marital Status: Married    Spouse Name: N/A  . Number of Children: 2  . Years of Education: N/A   Social History Main Topics  . Smoking status: Never Smoker   . Smokeless tobacco: Never Used  . Alcohol Use: No  . Drug Use: No  . Sexual Activity: Not Asked   Other Topics Concern  . None   Social History Narrative   Lives with husband in [place]    Outpatient Encounter Prescriptions as of 11/12/2015  Medication Sig  . aspirin 81 MG EC tablet Take 81 mg by mouth daily.    . B Complex Vitamins (PA B-COMPLEX WITH B-12) TABS Take 1 tablet by mouth daily.    . brimonidine (ALPHAGAN P) 0.1 % SOLN Place 1 drop into the left eye 2 (two) times daily.  Marland Kitchen BYSTOLIC 5 MG tablet TAKE 1 TABLET  BY MOUTH 2 TIMES DAILY.  Marland Kitchen  Calcium Carbonate-Vitamin D (CALCIUM-VITAMIN D) 600-200 MG-UNIT CAPS Take by mouth. Takes 1/2 tablet daily.  . dorzolamide-timolol (COSOPT) 22.3-6.8 MG/ML ophthalmic solution Place 1 drop into the left eye 2 (two) times daily.   . furosemide (LASIX) 40 MG tablet TAKE 1 TABLET (40 MG TOTAL) BY MOUTH DAILY.  Marland Kitchen Glucosamine-Chondroitin 1500-1200 MG/30ML LIQD Take by mouth. Takes 1/2 tablet daily.  . hydrALAZINE (APRESOLINE) 100 MG tablet Take 1 tablet (100 mg total) by mouth 4 (four) times daily. (Patient taking differently: Take 250 mg by mouth daily. 100mg -breakfast, 50mg -dinner, 100mg -bedtime)  . latanoprost (XALATAN) 0.005 % ophthalmic solution Place 1 drop into the left eye at bedtime.   . lidocaine-prilocaine (EMLA) cream Apply 1 application topically as needed.  Marland Kitchen losartan (COZAAR) 100 MG tablet TAKE 1 TABLET BY MOUTH DAILY.  . Magnesium 250 MG TABS Take 250  mg by mouth daily.  . multivitamin (THERAGRAN) per tablet Take 1 tablet by mouth daily.    Marland Kitchen omeprazole (PRILOSEC) 20 MG capsule TAKE 1 CAPSULE  (20 MG TOTAL) BY MOUTH DAILY.  . Polyethylene Glycol 3350 (MIRALAX PO) Take by mouth as needed.   . ranitidine (ZANTAC) 75 MG tablet Take 75 mg by mouth at bedtime.  . simvastatin (ZOCOR) 10 MG tablet TAKE 1 TABLET  BY MOUTH AT BEDTIME.  . vitamin C (ASCORBIC ACID) 500 MG tablet Take 500 mg by mouth daily.     No facility-administered encounter medications on file as of 11/12/2015.    Review of Systems  Constitutional: Negative for appetite change and unexpected weight change.  HENT: Negative for congestion and sinus pressure.   Eyes: Negative for pain and visual disturbance.  Respiratory: Negative for cough, chest tightness and shortness of breath.   Cardiovascular: Negative for chest pain and palpitations.  Gastrointestinal: Negative for nausea, vomiting, abdominal pain and diarrhea.  Genitourinary: Negative for dysuria and difficulty urinating.  Musculoskeletal: Positive for back pain (chronic. ). Negative for joint swelling.  Skin: Negative for color change and rash.  Neurological: Negative for dizziness, light-headedness and headaches.  Psychiatric/Behavioral: Negative for dysphoric mood and agitation.       Objective:     Blood pressure rechecked by me:  142/68  Physical Exam  Constitutional: She appears well-developed and well-nourished. No distress.  HENT:  Nose: Nose normal.  Mouth/Throat: Oropharynx is clear and moist.  Eyes: Conjunctivae are normal. Right eye exhibits no discharge. Left eye exhibits no discharge.  Neck: Neck supple. No thyromegaly present.  Cardiovascular: Normal rate and regular rhythm.   Pulmonary/Chest: Breath sounds normal. No respiratory distress. She has no wheezes.  Abdominal: Soft. Bowel sounds are normal. There is no tenderness.  Musculoskeletal: She exhibits no tenderness.  Increase pedal and  ankle edema L>R.    Lymphadenopathy:    She has no cervical adenopathy.  Skin: No rash noted. No erythema.  Psychiatric: She has a normal mood and affect. Her behavior is normal.    BP 140/80 mmHg  Pulse 65  Temp(Src) 97.6 F (36.4 C) (Oral)  Resp 18  Ht 5\' 4"  (1.626 m)  Wt 144 lb 2 oz (65.375 kg)  BMI 24.73 kg/m2  SpO2 96% Wt Readings from Last 3 Encounters:  11/12/15 144 lb 2 oz (65.375 kg)  08/27/15 144 lb 12 oz (65.658 kg)  06/30/15 146 lb 2 oz (66.282 kg)     Lab Results  Component Value Date   WBC 7.3 11/12/2015   HGB 12.4 11/12/2015   HCT 37.9 11/12/2015   PLT 199.0 11/12/2015  GLUCOSE 106* 11/12/2015   CHOL 152 11/12/2015   TRIG 106.0 11/12/2015   HDL 69.40 11/12/2015   LDLCALC 61 11/12/2015   ALT 16 11/12/2015   AST 26 11/12/2015   NA 136 11/12/2015   K 4.6 11/12/2015   CL 104 11/12/2015   CREATININE 1.44* 11/12/2015   BUN 41* 11/12/2015   CO2 25 11/12/2015   TSH 3.04 11/12/2015    Ct Lumbar Spine Wo Contrast  10/13/2015  CLINICAL DATA:  81 year old female with L3 vertebral fracture. Chronic lumbar back pain. Chronic scoliosis. Fall 6-8 months ago. Subsequent encounter. EXAM: CT LUMBAR SPINE WITHOUT CONTRAST TECHNIQUE: Multidetector CT imaging of the lumbar spine was performed without intravenous contrast administration. Multiplanar CT image reconstructions were also generated. COMPARISON:  Lumbar MRI 07/30/2015. CT Abdomen and Pelvis 09/02/2010. FINDINGS: Osteopenia. Moderate to severe dextro convex lumbar scoliosis, not significantly changed since 2011. Right lateral listhesis of L3 on L4 measuring up to 9 mm. L3 inferior endplate compression fracture re- demonstrated with sclerosis and mild cortical deformity (series 8, image 31 and series 9, image 34). Less than 10% loss of vertebral body height. No significant retropulsion of bone. L3 pedicles and posterior elements appear intact. No other acute or subacute osseous abnormality identified. Visible sacrum  and SI joints appear intact. Widespread vacuum disc phenomena. Widespread lumbar endplate spurring. Widespread lumbar facet hypertrophy. Mild multifactorial spinal stenosis at the L3-L4 level (series 7, image 75) is stable. Chronic large hiatal hernia, renal cysts, calcified aortic atherosclerosis. Paraspinal muscle atrophy. IMPRESSION: 1. Subacute L3 inferior endplate compression fracture re- demonstrated with less than 10% loss of vertebral body height, and no significant retropulsion of bone or complicating features. 2. Underlying osteopenia and chronic moderate to severe lumbar scoliosis with widespread disc, endplate, and facet degeneration. Stable mild multifactorial spinal stenosis at L3-L4. 3. Chronic large hiatal hernia, calcified aortic atherosclerosis. Electronically Signed   By: Genevie Ann M.D.   On: 10/13/2015 16:02       Assessment & Plan:   Problem List Items Addressed This Visit    Chronic back pain    Saw Dr Carloyn Manner.  Wants to hold on any further intervention.  Follow.       Chronic kidney disease (CKD), stage III (moderate)    Followed by nephrology.  Cr has been stable around 1.4.  Follow.        EDEMA    Overall stable.  Compression hose.        Endometrial cancer [facility])    Was seeing Dr Ouida Sills.  She declines f/u or any further evaluation.        GERD (gastroesophageal reflux disease)    Upper symptoms controlled.  On omeprazole.        Relevant Orders   CBC with Differential/Platelet (Completed)   Hypercholesterolemia    On simvastatin.  Low cholesterol diet and exercise.  Follow lipid panel and liver function tests.        Relevant Orders   TSH (Completed)   Lipid panel (Completed)   Hepatic function panel (Completed)   Hypertension - Primary    Blood pressure under good control.  Continue same medication regimen.  Follow pressures.  Follow metabolic panel.        Relevant Orders   Basic metabolic panel (Completed)       Einar Pheasant, MD

## 2015-11-12 NOTE — Progress Notes (Signed)
Pre-visit discussion using our clinic review tool. No additional management support is needed unless otherwise documented below in the visit note.  

## 2015-11-13 ENCOUNTER — Other Ambulatory Visit: Payer: Self-pay | Admitting: Internal Medicine

## 2015-11-13 DIAGNOSIS — N183 Chronic kidney disease, stage 3 unspecified: Secondary | ICD-10-CM

## 2015-11-13 NOTE — Progress Notes (Signed)
Order placed for f/u metabolic panel.  

## 2015-11-14 ENCOUNTER — Encounter: Payer: Self-pay | Admitting: *Deleted

## 2015-11-15 ENCOUNTER — Encounter: Payer: Self-pay | Admitting: Internal Medicine

## 2015-11-15 NOTE — Assessment & Plan Note (Signed)
On simvastatin.  Low cholesterol diet and exercise.  Follow lipid panel and liver function tests.   

## 2015-11-15 NOTE — Assessment & Plan Note (Signed)
Blood pressure under good control.  Continue same medication regimen.  Follow pressures.  Follow metabolic panel.   

## 2015-11-15 NOTE — Assessment & Plan Note (Signed)
Saw Dr Carloyn Manner.  Wants to hold on any further intervention.  Follow.

## 2015-11-15 NOTE — Assessment & Plan Note (Signed)
Overall stable.  Compression hose.

## 2015-11-15 NOTE — Assessment & Plan Note (Signed)
Upper symptoms controlled.  On omeprazole.   

## 2015-11-15 NOTE — Assessment & Plan Note (Signed)
Was seeing Dr Ouida Sills.  She declines f/u or any further evaluation.

## 2015-11-15 NOTE — Assessment & Plan Note (Signed)
Followed by nephrology.  Cr has been stable around 1.4.  Follow.

## 2015-11-21 ENCOUNTER — Other Ambulatory Visit: Payer: Self-pay | Admitting: Cardiovascular Disease

## 2015-11-27 ENCOUNTER — Other Ambulatory Visit: Payer: Self-pay | Admitting: Internal Medicine

## 2015-12-01 ENCOUNTER — Other Ambulatory Visit: Payer: Medicare Other

## 2015-12-11 ENCOUNTER — Other Ambulatory Visit (INDEPENDENT_AMBULATORY_CARE_PROVIDER_SITE_OTHER): Payer: Medicare Other

## 2015-12-11 DIAGNOSIS — N183 Chronic kidney disease, stage 3 unspecified: Secondary | ICD-10-CM

## 2015-12-11 LAB — BASIC METABOLIC PANEL
BUN: 38 mg/dL — ABNORMAL HIGH (ref 6–23)
CALCIUM: 9.6 mg/dL (ref 8.4–10.5)
CO2: 25 meq/L (ref 19–32)
CREATININE: 1.32 mg/dL — AB (ref 0.40–1.20)
Chloride: 105 mEq/L (ref 96–112)
GFR: 40.48 mL/min — AB (ref >60.00)
GLUCOSE: 101 mg/dL — AB (ref 70–99)
Potassium: 4.8 mEq/L (ref 3.5–5.1)
Sodium: 137 mEq/L (ref 135–145)

## 2015-12-12 ENCOUNTER — Other Ambulatory Visit: Payer: Self-pay | Admitting: Cardiovascular Disease

## 2015-12-13 ENCOUNTER — Encounter: Payer: Self-pay | Admitting: Internal Medicine

## 2015-12-17 ENCOUNTER — Encounter: Payer: Self-pay | Admitting: Cardiovascular Disease

## 2015-12-17 ENCOUNTER — Ambulatory Visit (INDEPENDENT_AMBULATORY_CARE_PROVIDER_SITE_OTHER): Payer: Medicare Other | Admitting: Cardiovascular Disease

## 2015-12-17 VITALS — BP 138/80 | HR 58 | Ht 64.0 in | Wt 149.0 lb

## 2015-12-17 DIAGNOSIS — I5032 Chronic diastolic (congestive) heart failure: Secondary | ICD-10-CM

## 2015-12-17 DIAGNOSIS — R609 Edema, unspecified: Secondary | ICD-10-CM | POA: Diagnosis not present

## 2015-12-17 DIAGNOSIS — M549 Dorsalgia, unspecified: Secondary | ICD-10-CM

## 2015-12-17 DIAGNOSIS — G8929 Other chronic pain: Secondary | ICD-10-CM

## 2015-12-17 DIAGNOSIS — R0602 Shortness of breath: Secondary | ICD-10-CM

## 2015-12-17 DIAGNOSIS — I159 Secondary hypertension, unspecified: Secondary | ICD-10-CM | POA: Diagnosis not present

## 2015-12-17 NOTE — Patient Instructions (Signed)
You are doing well. No medication changes were made.  Try to wean the furosemide, Take sparingly for shortness of breath Compression hose for swelling of the left leg  Extra hydralazine for elevated blood pressure  Please call us if you have new issues that need to be addressed before your next appt.  Your physician wants you to follow-up in: 6 months.  You will receive a reminder letter in the mail two months in advance. If you don't receive a letter, please call our office to schedule the follow-up appointment.

## 2015-12-17 NOTE — Assessment & Plan Note (Signed)
She's not ready for surgery at this time Concerned as husband is having cancer therapy

## 2015-12-17 NOTE — Assessment & Plan Note (Signed)
Blood pressure is well controlled on today's visit. No changes made to the medications. 

## 2015-12-17 NOTE — Progress Notes (Signed)
[name], female    DOB: 1927-07-10, 82 y.o.   MRN: [id number]  HPI Comments: [name] is an 82 yo pleasant woman with history of HTN and diastolic CHF, chronic lower extremity edema (left leg in particular), presents for followup of her hypertension and diastolic CHF  She has severe scoliosis and chronic back pain and requires pain medication.  Husband recently diagnosed with pancreatic cancer, he is on chemotherapy and radiation treatment at [facility]  In follow-up, she reports blood pressures been relatively well-controlled. She takes hydralazine 100 mg alternating with 50 mg through the day Severe back pain She has been putting off back surgery as husband is having cancer treatment Has been using less Lasix, wearing more of her compression hose for left leg swelling Recent renal function improving, perhaps with less furosemide use Some shortness of breath climbing up the hill to get the office today. No regular exercise program, very deconditioned at baseline   EKG on today's visit shows normal sinus rhythm with rate 58 bpm, no significant ST or T-wave changes  Other past medical history Chronic back pain, not a candidate for back surgery Continued eye problem, macular degeneration. Baseline Creatinine 1.4 Tries to wear compression hose when she can.   catheterization in 2003 showing luminal irregularities,  negative stress test in October 2010 with no ischemia, echocardiogram August 2010 showing normal systolic function, mild to moderate tricuspid regurgitation, mildly elevated right ventricular systolic pressures consistent with mild pulmonary hypertension, ejection fraction 60%.  Allergies  Allergen Reactions  . Benicar [Olmesartan] Other (See Comments)    Lip swelling  . Celebrex [Celecoxib]   . Penicillins     Outpatient Encounter Prescriptions as of 12/17/2015  Medication Sig  . aspirin 81 MG EC tablet Take 81 mg by mouth daily.    . B Complex  Vitamins (PA B-COMPLEX WITH B-12) TABS Take 1 tablet by mouth daily.    . brimonidine (ALPHAGAN P) 0.1 % SOLN Place 1 drop into the left eye 2 (two) times daily.  Marland Kitchen BYSTOLIC 5 MG tablet TAKE 1 TABLET  BY MOUTH 2 TIMES DAILY.  . Calcium Carbonate-Vitamin D (CALCIUM-VITAMIN D) 600-200 MG-UNIT CAPS Take by mouth. Takes 1/2 tablet daily.  . dorzolamide-timolol (COSOPT) 22.3-6.8 MG/ML ophthalmic solution Place 1 drop into the left eye 2 (two) times daily.   . furosemide (LASIX) 40 MG tablet TAKE 1 TABLET (40 MG TOTAL) BY MOUTH DAILY. (Patient taking differently: TAKE 1 TABLET (40 MG TOTAL) BY MOUTH DAILY AS NEEDED.)  . Glucosamine-Chondroitin 1500-1200 MG/30ML LIQD Take by mouth. Takes 1/2 tablet daily.  . hydrALAZINE (APRESOLINE) 100 MG tablet Take 1 tablet (100 mg total) by mouth 4 (four) times daily. (Patient taking differently: Take 50-100 mg by mouth 2 (two) times daily. 100mg -breakfast, 50mg -dinner, 100mg -bedtime)  . latanoprost (XALATAN) 0.005 % ophthalmic solution Place 1 drop into the left eye at bedtime.   Marland Kitchen losartan (COZAAR) 100 MG tablet TAKE 1 TABLET BY MOUTH DAILY.  . Magnesium 250 MG TABS Take 250 mg by mouth daily.  . multivitamin (THERAGRAN) per tablet Take 1 tablet by mouth daily.    Marland Kitchen omeprazole (PRILOSEC) 20 MG capsule TAKE 1 CAPSULE  (20 MG TOTAL) BY MOUTH DAILY.  . Polyethylene Glycol 3350 (MIRALAX PO) Take by mouth daily.   . ranitidine (ZANTAC) 75 MG tablet Take 75 mg by mouth at bedtime.  . simvastatin (ZOCOR) 10 MG tablet TAKE 1 TABLET  BY MOUTH AT BEDTIME.  . vitamin C (ASCORBIC ACID)  500 MG tablet Take 500 mg by mouth daily.    . [DISCONTINUED] lidocaine-prilocaine (EMLA) cream Apply 1 application topically as needed. (Patient not taking: Reported on 12/17/2015)   No facility-administered encounter medications on file as of 12/17/2015.    Past Medical History  Diagnosis Date  . S/P cardiac catheterization 05/2002    Luminal irregularities only in the coronaries. EF 55%.  There was some irregulatrity in the right renal artery suggestive of possible fibromuscular dysplasia. Myoview in 5/08 showed no ischemia or infarction. Lexiscan myoview at [facility] (10/10) showed EF 55%, normal wall motion, no evience ofor ischemia or infarction.  . Diastolic CHF, acute ([place])     Echo (8/10) with EF 60-65%, mild LVH, mild to moderate TR, PASP 40 mmHg.  Marland Kitchen HTN (hypertension)   . Dizziness     Thought to be side effect of Norvasc  . Hypercalcemia     On HCTZ  . Scoliosis associated with other condition     Chronic back pain  . GERD (gastroesophageal reflux disease)   . Uterine cancer ([place])     S/P hypsterectomy in 2008  . Hyperkalemia   . Macular degeneration   . Renal insufficiency   . Hypercholesterolemia   . Osteoporosis   . Renal fibromuscular dysplasia ([place])   . Glaucoma     Past Surgical History  Procedure Laterality Date  . Cardiac catheterization      Left  . Refractive surgery    . Total abdominal hysterectomy w/ bilateral salpingoophorectomy      stage I C well differentiated adenocarcinoma  . Cataract extraction Right     Social History  reports that she has never smoked. She has never used smokeless tobacco. She reports that she does not drink alcohol or use illicit drugs.  Family History family history includes Diabetes in her cousin; Heart disease in her father and mother; Parkinson's disease in her mother.    Review of Systems  Constitutional: Negative.   Respiratory: Negative.   Cardiovascular: Positive for leg swelling.       Left leg, compression hose in place  Gastrointestinal: Negative.   Musculoskeletal: Positive for back pain and gait problem.  Neurological: Negative.   Hematological: Negative.   Psychiatric/Behavioral: Negative.   All other systems reviewed and are negative.   BP 138/80 mmHg  Pulse 58  Ht 5\' 4"  (1.626 m)  Wt 149 lb (67.586 kg)  BMI 25.56 kg/m2  Physical Exam  Constitutional: She is oriented to person, place,  and time. She appears well-developed and well-nourished.  Frail  HENT:  Head: Normocephalic.  Nose: Nose normal.  Mouth/Throat: Oropharynx is clear and moist.  Eyes: Conjunctivae are normal. Pupils are equal, round, and reactive to light.  Neck: Normal range of motion. Neck supple. No JVD present.  Cardiovascular: Normal rate, regular rhythm, S1 normal, S2 normal, normal heart sounds and intact distal pulses.  Exam reveals no gallop and no friction rub.   No murmur heard. Left foot and ankle greater than right leg swelling, nonpitting  Pulmonary/Chest: Effort normal and breath sounds normal. No respiratory distress. She has no wheezes. She has no rales. She exhibits no tenderness.  Abdominal: Soft. Bowel sounds are normal. She exhibits no distension. There is no tenderness.  Musculoskeletal: Normal range of motion. She exhibits no edema or tenderness.  Lymphadenopathy:    She has no cervical adenopathy.  Neurological: She is alert and oriented to person, place, and time. Coordination normal.  Skin: Skin is warm and dry.  No rash noted. No erythema.  Psychiatric: She has a normal mood and affect. Her behavior is normal. Judgment and thought content normal.    Assessment and Plan  Nursing note and vitals reviewed.

## 2015-12-17 NOTE — Assessment & Plan Note (Signed)
Mild  Shortness of breath on today's visit coming up a hill to the office. She is very deconditioned, unable to walk very fast. Less likely diastolic CHF. No further testing at this time

## 2015-12-17 NOTE — Assessment & Plan Note (Signed)
Swelling on the left likely venous insufficiency, recommended continued use of her compression hose She reports this helps her symptoms

## 2015-12-17 NOTE — Assessment & Plan Note (Signed)
Recommended she use her Lasix sparingly for shortness of breath Improving renal function with less Lasix suggesting prerenal state

## 2015-12-20 ENCOUNTER — Other Ambulatory Visit: Payer: Self-pay | Admitting: Cardiovascular Disease

## 2016-01-11 ENCOUNTER — Telehealth: Payer: Self-pay | Admitting: Cardiovascular Disease

## 2016-01-11 MED ORDER — NEBIVOLOL HCL 5 MG PO TABS: ORAL_TABLET | ORAL | Status: DC

## 2016-01-11 NOTE — Telephone Encounter (Signed)
Refill sent for Bystolic 5 mg one tablet twice a day #180 with 3 refills.

## 2016-01-11 NOTE — Telephone Encounter (Signed)
°*  STAT* If patient is at the pharmacy, call can be transferred to refill team.   1. Which medications need to be refilled? (please list name of each medication and dose if known) Bystolic 5 mg   2. Which pharmacy/location (including street and city if local pharmacy) is medication to be sent to? [facility] in [place]   3. Do they need a 30 day or 90 day supply? 90 day   Pt will be out tonight, pt husband also states we need to "re-auth" medication in order for it to be affordable.

## 2016-02-01 ENCOUNTER — Telehealth: Payer: Self-pay | Admitting: Internal Medicine

## 2016-02-01 NOTE — Telephone Encounter (Signed)
Pt's husband dropped off paper work for a handicap renewal to be completed from Dr. Nicki Reaper. Paper work is in Dr. Bary Leriche box.

## 2016-02-01 NOTE — Telephone Encounter (Signed)
PLease advise, thanks.

## 2016-02-03 NOTE — Telephone Encounter (Signed)
Form placed up front for pick up on 02/02/16. Pt notified

## 2016-03-26 ENCOUNTER — Telehealth: Payer: Self-pay | Admitting: Internal Medicine

## 2016-03-26 NOTE — Telephone Encounter (Signed)
Cardiology On Call  Talked to husband on the phone. He reports that the patient has her SBP 140 in morning and then came down to 130 in the afternoon and now 115. She took her Losartan 100 mg 6 am and Bystolic 5 mg. She took half tablet (50 mg) of Hydralazine twice earlier today. Now he is concerned about her Bp being on the lower side.   Plan:  Continue Bytolic 5 mg po bid and Losartan 100 mg po qd Hold Hydralazine for now but restart if SBP goes above 150.   Wandra Mannan, MD

## 2016-03-28 NOTE — Telephone Encounter (Signed)
Left message on pt's vm w/ Dr. Donivan Scull recommendation.  Asked pt to call back next week w/ BP readings.

## 2016-03-28 NOTE — Telephone Encounter (Signed)
Would monitor blood pressure without hydralazine If she is losing weight, blood pressure can drop, medications need to be adjusted

## 2016-04-03 ENCOUNTER — Other Ambulatory Visit: Payer: Self-pay | Admitting: Cardiovascular Disease

## 2016-05-05 ENCOUNTER — Telehealth: Payer: Self-pay | Admitting: *Deleted

## 2016-05-05 NOTE — Telephone Encounter (Signed)
Patients son has requested to have patients appt, moved up earlier that 06/15,due to pain.  Pt contact [phone]

## 2016-05-06 NOTE — Telephone Encounter (Signed)
Need more information.  What symptoms is she having?  She has had chronic back pain and has seen multiple specialists.  Unable to take narcotic medications secondary to intolerance.

## 2016-05-06 NOTE — Telephone Encounter (Signed)
Her appt is next Thursday.  I can see her Tuesday 05/10/16 - 1:30.  If agrees to change appt, send to [place] to schedule.   Thanks.

## 2016-05-06 NOTE — Telephone Encounter (Signed)
Patient is aware and appointment rescheduled

## 2016-05-06 NOTE — Telephone Encounter (Signed)
Patient is having lower back pain due to her scoliosis.  Because her husband is having surgery soon, she would like an earlier appointment or possibly a prescription. She has tried some pain medication with no relief.  She states that the pain medication that she came home from the hospital worked well.  Tramadol? She thinks.

## 2016-05-10 ENCOUNTER — Ambulatory Visit (INDEPENDENT_AMBULATORY_CARE_PROVIDER_SITE_OTHER): Payer: Medicare Other | Admitting: Internal Medicine

## 2016-05-10 ENCOUNTER — Encounter: Payer: Self-pay | Admitting: Internal Medicine

## 2016-05-10 ENCOUNTER — Other Ambulatory Visit: Payer: Self-pay | Admitting: Internal Medicine

## 2016-05-10 VITALS — BP 110/80 | HR 65 | Temp 97.8°F | Resp 18 | Ht 62.0 in | Wt 135.4 lb

## 2016-05-10 DIAGNOSIS — N183 Chronic kidney disease, stage 3 unspecified: Secondary | ICD-10-CM

## 2016-05-10 DIAGNOSIS — R609 Edema, unspecified: Secondary | ICD-10-CM | POA: Diagnosis not present

## 2016-05-10 DIAGNOSIS — E7800 Pure hypercholesterolemia, unspecified: Secondary | ICD-10-CM | POA: Diagnosis not present

## 2016-05-10 DIAGNOSIS — I159 Secondary hypertension, unspecified: Secondary | ICD-10-CM

## 2016-05-10 DIAGNOSIS — Z0000 Encounter for general adult medical examination without abnormal findings: Secondary | ICD-10-CM

## 2016-05-10 DIAGNOSIS — M549 Dorsalgia, unspecified: Secondary | ICD-10-CM

## 2016-05-10 DIAGNOSIS — R739 Hyperglycemia, unspecified: Secondary | ICD-10-CM

## 2016-05-10 DIAGNOSIS — K2100 Gastro-esophageal reflux disease with esophagitis, without bleeding: Secondary | ICD-10-CM

## 2016-05-10 DIAGNOSIS — C541 Malignant neoplasm of endometrium: Secondary | ICD-10-CM

## 2016-05-10 DIAGNOSIS — M810 Age-related osteoporosis without current pathological fracture: Secondary | ICD-10-CM

## 2016-05-10 DIAGNOSIS — R634 Abnormal weight loss: Secondary | ICD-10-CM

## 2016-05-10 DIAGNOSIS — G8929 Other chronic pain: Secondary | ICD-10-CM

## 2016-05-10 MED ORDER — TRAMADOL HCL 50 MG PO TABS: 50.0000 mg | ORAL_TABLET | Freq: Two times a day (BID) | ORAL | Status: DC | PRN

## 2016-05-10 NOTE — Progress Notes (Signed)
Pre-visit discussion using our clinic review tool. No additional management support is needed unless otherwise documented below in the visit note.  

## 2016-05-10 NOTE — Progress Notes (Addendum)
[name], female   DOB: 1927/03/21, 82 y.o.   MRN: [id number]   Subjective:    [name], female    DOB: 1927/01/02, 82 y.o.   MRN: [id number]  HPI  Patient here for her physical exam.  She has had chronic back pain.  Sees Dr Carloyn Manner ([facility]).  Not a surgical candidate.  Has been managed with medication.  She has had intolerance to pain medication and to date has just been using tylenol to help with pain.  Increased pain recently.  Increased stress with husband's medical issues.  She has been at the hospital a lot.  Hurts more with standing.  No chest pain.  Breathing stable.  No abdominal pain.  Takes miralax.  Stools varying.  Weight loss.  States she is eating.  She feels weight loss related to stress.     Past Medical History  Diagnosis Date  . S/P cardiac catheterization 05/2002    Luminal irregularities only in the coronaries. EF 55%. There was some irregulatrity in the right renal artery suggestive of possible fibromuscular dysplasia. Myoview in 5/08 showed no ischemia or infarction. Lexiscan myoview at [facility] (10/10) showed EF 55%, normal wall motion, no evience ofor ischemia or infarction.  . Diastolic CHF, acute ([place])     Echo (8/10) with EF 60-65%, mild LVH, mild to moderate TR, PASP 40 mmHg.  Marland Kitchen HTN (hypertension)   . Dizziness     Thought to be side effect of Norvasc  . Hypercalcemia     On HCTZ  . Scoliosis associated with other condition     Chronic back pain  . GERD (gastroesophageal reflux disease)   . Uterine cancer ([place])     S/P hypsterectomy in 2008  . Hyperkalemia   . Macular degeneration   . Renal insufficiency   . Hypercholesterolemia   . Osteoporosis   . Renal fibromuscular dysplasia ([place])   . Glaucoma    Past Surgical History  Procedure Laterality Date  . Cardiac catheterization      Left  . Refractive surgery    . Total abdominal hysterectomy w/ bilateral salpingoophorectomy      stage I C well differentiated  adenocarcinoma  . Cataract extraction Right    Family History  Problem Relation Age of Onset  . Heart disease Father   . Heart disease Mother   . Parkinson's disease Mother   . Diabetes Cousin    Social History   Social History  . Marital Status: Married    Spouse Name: N/A  . Number of Children: 2  . Years of Education: N/A   Social History Main Topics  . Smoking status: Never Smoker   . Smokeless tobacco: Never Used  . Alcohol Use: No  . Drug Use: No  . Sexual Activity: Not Asked   Other Topics Concern  . None   Social History Narrative   Lives with husband in [place]    Outpatient Encounter Prescriptions as of 05/10/2016  Medication Sig  . aspirin 81 MG EC tablet Take 81 mg by mouth daily.    . B Complex Vitamins (PA B-COMPLEX WITH B-12) TABS Take 1 tablet by mouth daily.    . brimonidine (ALPHAGAN P) 0.1 % SOLN Place 1 drop into the left eye 2 (two) times daily.  Marland Kitchen BYSTOLIC 5 MG tablet TAKE 1 TABLET  BY MOUTH 2 TIMES DAILY.  . Calcium Carbonate-Vitamin D (CALCIUM-VITAMIN D) 600-200 MG-UNIT CAPS Take by mouth. Takes 1/2 tablet daily.  Marland Kitchen  dorzolamide-timolol (COSOPT) 22.3-6.8 MG/ML ophthalmic solution Place 1 drop into the left eye 2 (two) times daily.   . furosemide (LASIX) 40 MG tablet TAKE 1 TABLET (40 MG TOTAL) BY MOUTH DAILY. (Patient taking differently: TAKE 1 TABLET (40 MG TOTAL) BY MOUTH DAILY AS NEEDED.)  . Glucosamine-Chondroitin 1500-1200 MG/30ML LIQD Take by mouth. Takes 1/2 tablet daily.  . hydrALAZINE (APRESOLINE) 100 MG tablet Take 1 tablet (100 mg total) by mouth 4 (four) times daily. (Patient taking differently: Take 50-100 mg by mouth 2 (two) times daily. 100mg -breakfast, 50mg -dinner, 100mg -bedtime)  . latanoprost (XALATAN) 0.005 % ophthalmic solution Place 1 drop into the left eye at bedtime.   Marland Kitchen losartan (COZAAR) 100 MG tablet TAKE 1 TABLET BY MOUTH DAILY.  Marland Kitchen losartan (COZAAR) 100 MG tablet TAKE 1 TABLET BY MOUTH DAILY.  . Magnesium 250 MG TABS  Take 250 mg by mouth daily.  . multivitamin (THERAGRAN) per tablet Take 1 tablet by mouth daily.    . nebivolol (BYSTOLIC) 5 MG tablet TAKE 1 TABLET  BY MOUTH 2 TIMES DAILY.  Marland Kitchen omeprazole (PRILOSEC) 20 MG capsule TAKE 1 CAPSULE  (20 MG TOTAL) BY MOUTH DAILY.  . Polyethylene Glycol 3350 (MIRALAX PO) Take by mouth daily.   . ranitidine (ZANTAC) 75 MG tablet Take 75 mg by mouth at bedtime.  . simvastatin (ZOCOR) 10 MG tablet TAKE 1 TABLET  BY MOUTH AT BEDTIME.  . simvastatin (ZOCOR) 10 MG tablet TAKE 1 TABLET  BY MOUTH AT BEDTIME.  . vitamin C (ASCORBIC ACID) 500 MG tablet Take 500 mg by mouth daily.    . traMADol (ULTRAM) 50 MG tablet Take 1 tablet (50 mg total) by mouth 2 (two) times daily as needed.   No facility-administered encounter medications on file as of 05/10/2016.     Outpatient Encounter Prescriptions as of 05/10/2016  Medication Sig  . aspirin 81 MG EC tablet Take 81 mg by mouth daily.    . B Complex Vitamins (PA B-COMPLEX WITH B-12) TABS Take 1 tablet by mouth daily.    . brimonidine (ALPHAGAN P) 0.1 % SOLN Place 1 drop into the left eye 2 (two) times daily.  Marland Kitchen BYSTOLIC 5 MG tablet TAKE 1 TABLET  BY MOUTH 2 TIMES DAILY.  . Calcium Carbonate-Vitamin D (CALCIUM-VITAMIN D) 600-200 MG-UNIT CAPS Take by mouth. Takes 1/2 tablet daily.  . dorzolamide-timolol (COSOPT) 22.3-6.8 MG/ML ophthalmic solution Place 1 drop into the left eye 2 (two) times daily.   . furosemide (LASIX) 40 MG tablet TAKE 1 TABLET (40 MG TOTAL) BY MOUTH DAILY. (Patient taking differently: TAKE 1 TABLET (40 MG TOTAL) BY MOUTH DAILY AS NEEDED.)  . Glucosamine-Chondroitin 1500-1200 MG/30ML LIQD Take by mouth. Takes 1/2 tablet daily.  . hydrALAZINE (APRESOLINE) 100 MG tablet Take 1 tablet (100 mg total) by mouth 4 (four) times daily. (Patient taking differently: Take 50-100 mg by mouth 2 (two) times daily. 100mg -breakfast, 50mg -dinner, 100mg -bedtime)  . latanoprost (XALATAN) 0.005 % ophthalmic solution Place 1 drop into  the left eye at bedtime.   Marland Kitchen losartan (COZAAR) 100 MG tablet TAKE 1 TABLET BY MOUTH DAILY.  Marland Kitchen losartan (COZAAR) 100 MG tablet TAKE 1 TABLET BY MOUTH DAILY.  . Magnesium 250 MG TABS Take 250 mg by mouth daily.  . multivitamin (THERAGRAN) per tablet Take 1 tablet by mouth daily.    . nebivolol (BYSTOLIC) 5 MG tablet TAKE 1 TABLET  BY MOUTH 2 TIMES DAILY.  Marland Kitchen omeprazole (PRILOSEC) 20 MG capsule TAKE 1 CAPSULE  (20 MG TOTAL) BY  MOUTH DAILY.  . Polyethylene Glycol 3350 (MIRALAX PO) Take by mouth daily.   . ranitidine (ZANTAC) 75 MG tablet Take 75 mg by mouth at bedtime.  . simvastatin (ZOCOR) 10 MG tablet TAKE 1 TABLET  BY MOUTH AT BEDTIME.  . simvastatin (ZOCOR) 10 MG tablet TAKE 1 TABLET  BY MOUTH AT BEDTIME.  . vitamin C (ASCORBIC ACID) 500 MG tablet Take 500 mg by mouth daily.    . traMADol (ULTRAM) 50 MG tablet Take 1 tablet (50 mg total) by mouth 2 (two) times daily as needed.   No facility-administered encounter medications on file as of 05/10/2016.    Review of Systems  Constitutional:       She is eating.  Weight loss.    HENT: Negative for congestion and sinus pressure.   Respiratory: Negative for cough, chest tightness and shortness of breath.   Cardiovascular: Negative for chest pain and palpitations.       Some swelling in her feet.  Not wearing her compression hose - regularly.    Gastrointestinal: Negative for nausea, vomiting and abdominal pain.  Genitourinary: Negative for dysuria and difficulty urinating.  Musculoskeletal: Positive for back pain. Negative for joint swelling.  Skin: Negative for color change and rash.  Neurological: Negative for dizziness, light-headedness and headaches.  Psychiatric/Behavioral: Negative for dysphoric mood and agitation.       Increased stress as outlined.         Objective:     Blood pressure rechecked by me:  148/72  Physical Exam  Constitutional: She appears well-developed and well-nourished. No distress.  HENT:  Nose: Nose  normal.  Mouth/Throat: Oropharynx is clear and moist.  Neck: Neck supple. No thyromegaly present.  Cardiovascular: Normal rate and regular rhythm.   Pulmonary/Chest: Breath sounds normal. No respiratory distress. She has no wheezes.  Declined breast exam.   Abdominal: Soft. Bowel sounds are normal. There is no tenderness.  Musculoskeletal: She exhibits no tenderness.  Pedal edema present.  No significant swelling extending up the leg.    Lymphadenopathy:    She has no cervical adenopathy.  Skin: No rash noted. No erythema.  Psychiatric: She has a normal mood and affect. Her behavior is normal.    BP 110/80 mmHg  Pulse 65  Temp(Src) 97.8 F (36.6 C) (Oral)  Resp 18  Ht 5\' 2"  (1.575 m)  Wt 135 lb 6 oz (61.406 kg)  BMI 24.75 kg/m2  SpO2 96% Wt Readings from Last 3 Encounters:  05/10/16 135 lb 6 oz (61.406 kg)  12/17/15 149 lb (67.586 kg)  11/12/15 144 lb 2 oz (65.375 kg)     Lab Results  Component Value Date   WBC 7.3 11/12/2015   HGB 12.4 11/12/2015   HCT 37.9 11/12/2015   PLT 199.0 11/12/2015   GLUCOSE 101* 12/11/2015   CHOL 152 11/12/2015   TRIG 106.0 11/12/2015   HDL 69.40 11/12/2015   LDLCALC 61 11/12/2015   ALT 16 11/12/2015   AST 26 11/12/2015   NA 137 12/11/2015   K 4.8 12/11/2015   CL 105 12/11/2015   CREATININE 1.32* 12/11/2015   BUN 38* 12/11/2015   CO2 25 12/11/2015   TSH 3.04 11/12/2015    Ct Lumbar Spine Wo Contrast  10/13/2015  CLINICAL DATA:  82 year old female with L3 vertebral fracture. Chronic lumbar back pain. Chronic scoliosis. Fall 6-8 months ago. Subsequent encounter. EXAM: CT LUMBAR SPINE WITHOUT CONTRAST TECHNIQUE: Multidetector CT imaging of the lumbar spine was performed without intravenous contrast administration. Multiplanar  CT image reconstructions were also generated. COMPARISON:  Lumbar MRI 07/30/2015. CT Abdomen and Pelvis 09/02/2010. FINDINGS: Osteopenia. Moderate to severe dextro convex lumbar scoliosis, not significantly changed  since 2011. Right lateral listhesis of L3 on L4 measuring up to 9 mm. L3 inferior endplate compression fracture re- demonstrated with sclerosis and mild cortical deformity (series 8, image 31 and series 9, image 34). Less than 10% loss of vertebral body height. No significant retropulsion of bone. L3 pedicles and posterior elements appear intact. No other acute or subacute osseous abnormality identified. Visible sacrum and SI joints appear intact. Widespread vacuum disc phenomena. Widespread lumbar endplate spurring. Widespread lumbar facet hypertrophy. Mild multifactorial spinal stenosis at the L3-L4 level (series 7, image 75) is stable. Chronic large hiatal hernia, renal cysts, calcified aortic atherosclerosis. Paraspinal muscle atrophy. IMPRESSION: 1. Subacute L3 inferior endplate compression fracture re- demonstrated with less than 10% loss of vertebral body height, and no significant retropulsion of bone or complicating features. 2. Underlying osteopenia and chronic moderate to severe lumbar scoliosis with widespread disc, endplate, and facet degeneration. Stable mild multifactorial spinal stenosis at L3-L4. 3. Chronic large hiatal hernia, calcified aortic atherosclerosis. Electronically Signed   By: Genevie Ann M.D.   On: 10/13/2015 16:02       Assessment & Plan:   Problem List Items Addressed This Visit    Chronic back pain    Increased pain as outlined.  Discussed at length with her today.  Discussed pain medications.  Has had intolerance to multiple pain medications.  Had some documented intolerance to tramadol in the past.  No allergic reaction.  We discussed options.   Wants to try tramadol again.  Follow for intolerance.        Relevant Medications   traMADol (ULTRAM) 50 MG tablet   Chronic kidney disease (CKD), stage III (moderate)    Followed by nephrology.       Relevant Orders   Basic metabolic panel   EDEMA    Pedal edema.  Compression hose.        Endometrial cancer [facility])    Was  seeing Dr Ouida Sills.  Declines f/u or further w/up.       Relevant Medications   traMADol (ULTRAM) 50 MG tablet   GERD (gastroesophageal reflux disease)    Symptoms controlled.        Health care maintenance    Was scheduled for physical.  Declined breast exam and declined to get up on table.  Declines mammogram.  Colonoscopy 05/2010 - diverticulosis.       Hypercholesterolemia - Primary    On simvastatin.  Low cholesterol diet and exercise.  Follow lipid panel and liver function tests.        Relevant Orders   Lipid panel   Hepatic function panel   Hypertension    Overall blood pressure doing better.  Same medication regimen.  Follow pressures.  Follow metabolic panel.        Loss of weight    She is eating.  She feels related to stress.  Desires no further intervention.  Follow.        Relevant Orders   CBC with Differential/Platelet   TSH   Osteoporosis   Relevant Orders   VITAMIN D 25 Hydroxy (Vit-D Deficiency, Fractures)    Other Visit Diagnoses    Hyperglycemia        Relevant Orders    Hemoglobin A1c        Einar Pheasant, MD

## 2016-05-12 ENCOUNTER — Encounter: Payer: Medicare Other | Admitting: Internal Medicine

## 2016-05-12 ENCOUNTER — Encounter: Payer: Self-pay | Admitting: Internal Medicine

## 2016-05-12 DIAGNOSIS — R634 Abnormal weight loss: Secondary | ICD-10-CM | POA: Insufficient documentation

## 2016-05-12 NOTE — Assessment & Plan Note (Signed)
Was scheduled for physical.  Declined breast exam and declined to get up on table.  Declines mammogram.  Colonoscopy 05/2010 - diverticulosis.

## 2016-05-12 NOTE — Assessment & Plan Note (Signed)
Pedal edema.  Compression hose.

## 2016-05-12 NOTE — Addendum Note (Signed)
Addended by: Alisa Graff on: 05/12/2016 05:55 AM   Modules accepted: Miquel Dunn

## 2016-05-12 NOTE — Assessment & Plan Note (Signed)
Followed by nephrology. 

## 2016-05-12 NOTE — Assessment & Plan Note (Signed)
Was seeing Dr Ouida Sills.  Declines f/u or further w/up.

## 2016-05-12 NOTE — Assessment & Plan Note (Signed)
Increased pain as outlined.  Discussed at length with her today.  Discussed pain medications.  Has had intolerance to multiple pain medications.  Had some documented intolerance to tramadol in the past.  No allergic reaction.  We discussed options.   Wants to try tramadol again.  Follow for intolerance.

## 2016-05-12 NOTE — Assessment & Plan Note (Signed)
Symptoms controlled

## 2016-05-12 NOTE — Assessment & Plan Note (Signed)
Overall blood pressure doing better.  Same medication regimen.  Follow pressures.  Follow metabolic panel.

## 2016-05-12 NOTE — Assessment & Plan Note (Signed)
On simvastatin.  Low cholesterol diet and exercise.  Follow lipid panel and liver function tests.   

## 2016-05-12 NOTE — Assessment & Plan Note (Signed)
She is eating.  She feels related to stress.  Desires no further intervention.  Follow.

## 2016-05-16 IMAGING — CT CT L SPINE W/O CM
3 of 10 series · 12 of 33 positions shown, 14 images · non-contrast
Comparison: Lumbar MRI 07/30/2015. CT Abdomen and Pelvis
09/02/2010.

CLINICAL DATA: 88-year-old female with L3 vertebral fracture.
Chronic lumbar back pain. Chronic scoliosis. Fall 6-8 months ago.
Subsequent encounter.

EXAM:
CT LUMBAR SPINE WITHOUT CONTRAST
TECHNIQUE: Multidetector CT imaging of the lumbar spine was performed without
intravenous contrast administration. Multiplanar CT image
reconstructions were also generated.

[Series 7: l spine soft · axial · 0.35mm/px · z∈[-847,-677]mm · 4 of 129 slices shown, 5 images]
[im 22/129  soft-tissue]
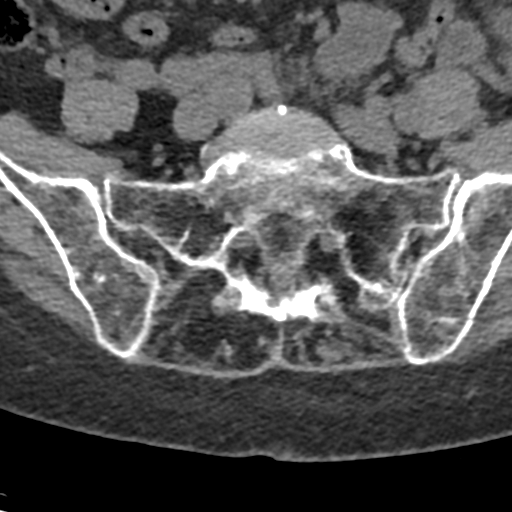
[im 22/129  bone]
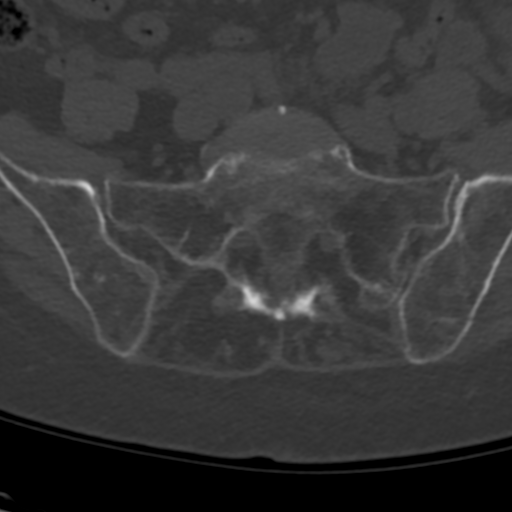
[im 43/129  bone]
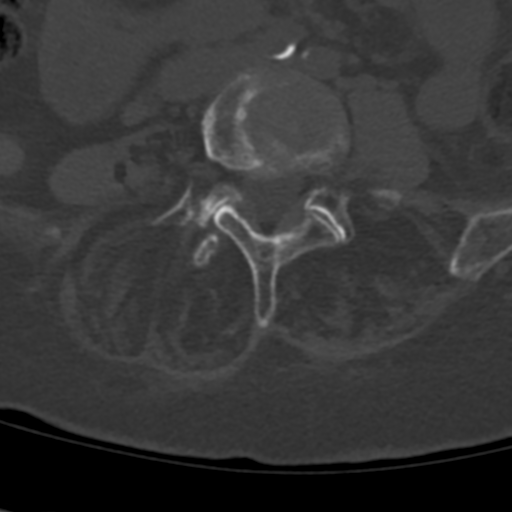
[im 86/129  bone]
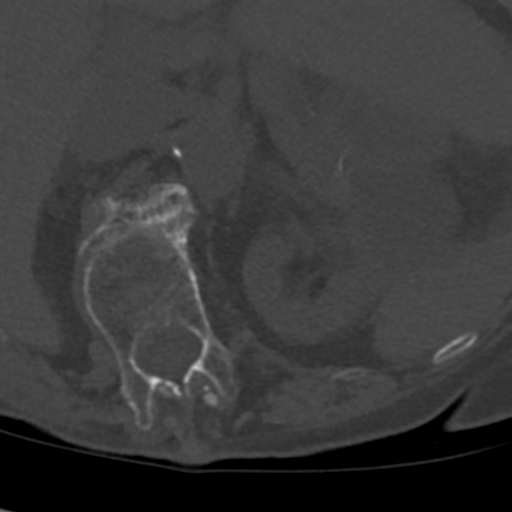
[im 107/129  bone]
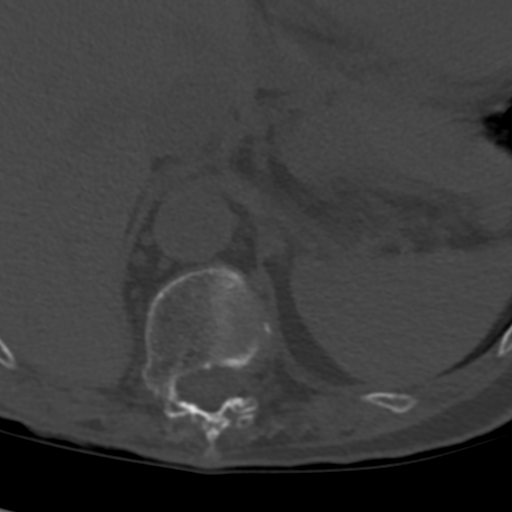

[Series 8: sag bone · sagittal · 0.34mm/px · 5 of 89 slices shown, 6 images]
[im 30/89  bone]
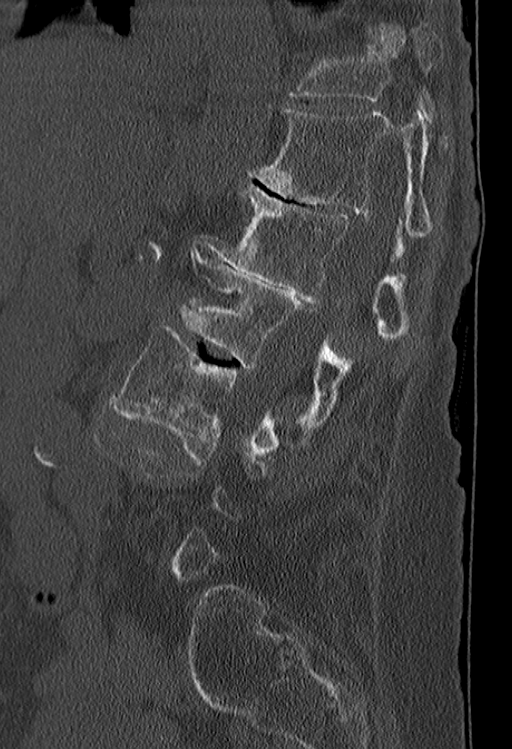
[im 37/89  bone]
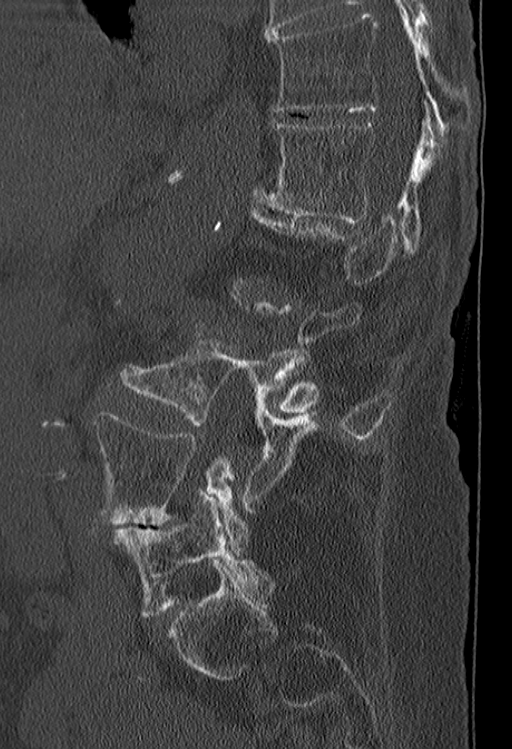
[im 45/89  soft-tissue]
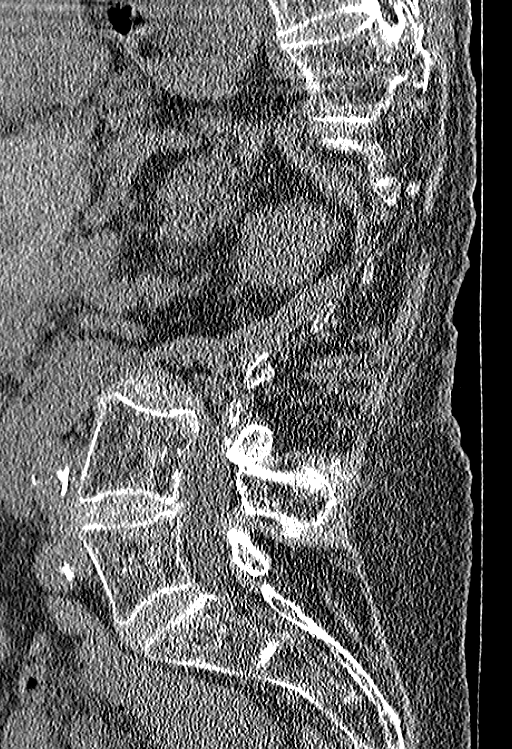
[im 45/89  bone]
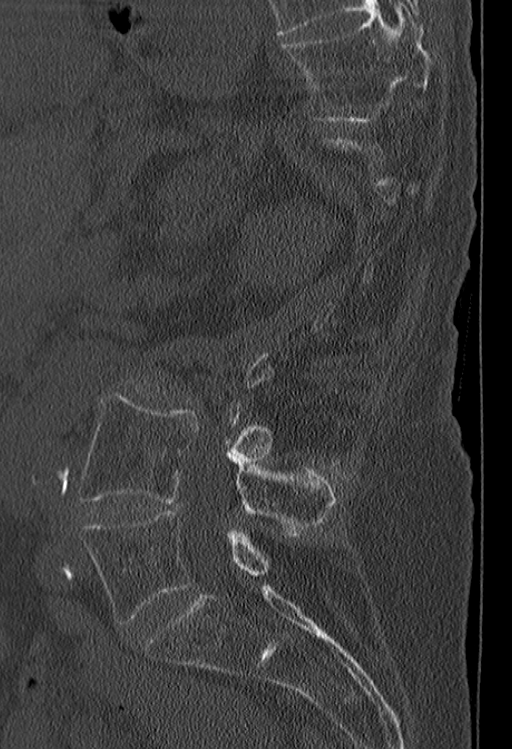
[im 52/89  bone]
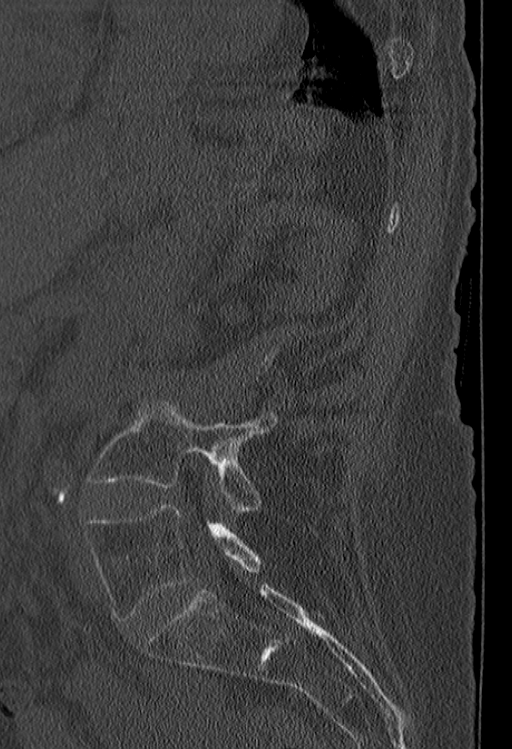
[im 59/89  bone]
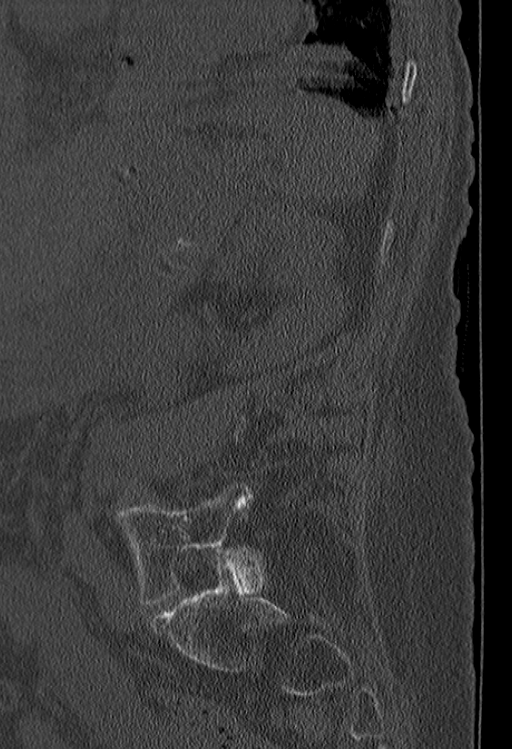

[Series 9: cor bone · coronal · 0.35mm/px · 3 of 79 slices shown]
[im 16/79  bone]
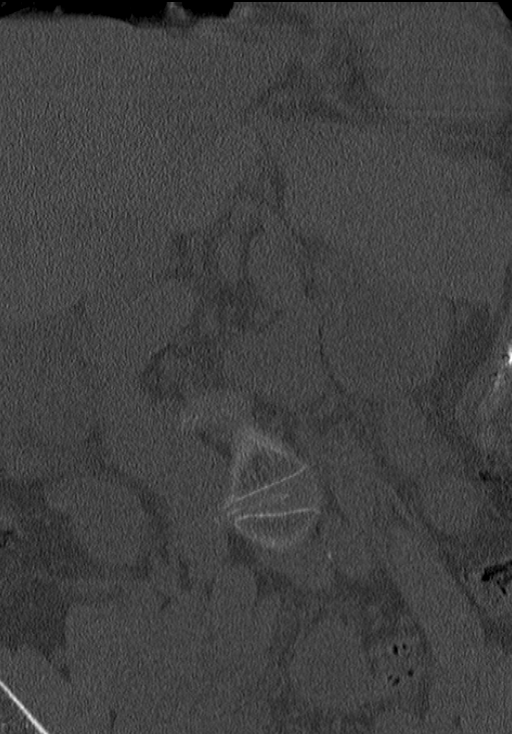
[im 32/79  bone]
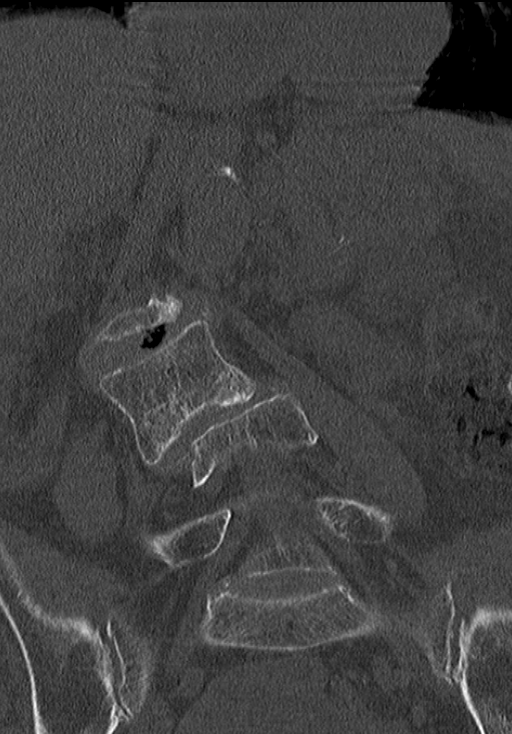
[im 47/79  bone]
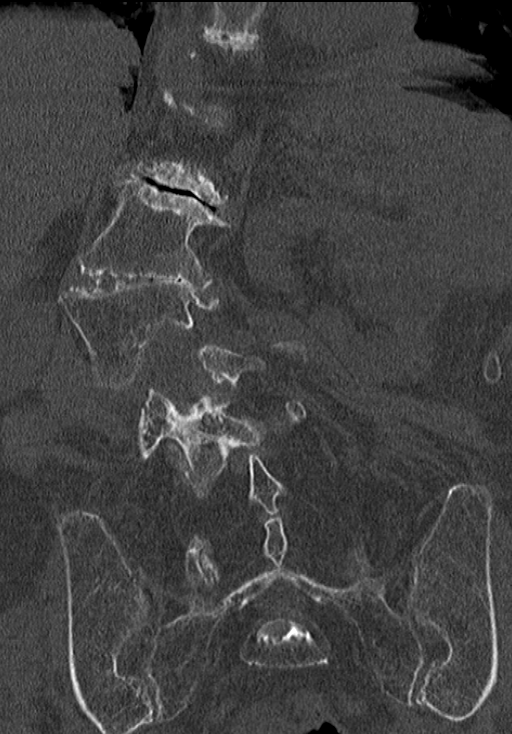

[12 of 33 positions shown; findings below may reference images not displayed]

FINDINGS: Osteopenia. Moderate to severe dextro convex lumbar scoliosis, not
significantly changed since 6511. Right lateral listhesis of L3 on
L4 measuring up to 9 mm.

L3 inferior endplate compression fracture re- demonstrated with
sclerosis and mild cortical deformity (series 8, image 31 and series
9, image 34). Less than 10% loss of vertebral body height. No
significant retropulsion of bone. L3 pedicles and posterior elements
appear intact.

No other acute or subacute osseous abnormality identified. Visible
sacrum and SI joints appear intact.

Widespread vacuum disc phenomena. Widespread lumbar endplate
spurring. Widespread lumbar facet hypertrophy. Mild multifactorial
spinal stenosis at the L3-L4 level (series 7, image 75) is stable.

Chronic large hiatal hernia, renal cysts, calcified aortic
atherosclerosis. Paraspinal muscle atrophy.
IMPRESSION: 1. Subacute L3 inferior endplate compression fracture re-
demonstrated with less than 10% loss of vertebral body height, and
no significant retropulsion of bone or complicating features.
2. Underlying osteopenia and chronic moderate to severe lumbar
scoliosis with widespread disc, endplate, and facet degeneration.
Stable mild multifactorial spinal stenosis at L3-L4.
3. Chronic large hiatal hernia, calcified aortic atherosclerosis.

## 2016-05-25 ENCOUNTER — Other Ambulatory Visit (INDEPENDENT_AMBULATORY_CARE_PROVIDER_SITE_OTHER): Payer: Medicare Other

## 2016-05-25 ENCOUNTER — Telehealth: Payer: Self-pay | Admitting: *Deleted

## 2016-05-25 DIAGNOSIS — R739 Hyperglycemia, unspecified: Secondary | ICD-10-CM | POA: Diagnosis not present

## 2016-05-25 DIAGNOSIS — N183 Chronic kidney disease, stage 3 unspecified: Secondary | ICD-10-CM

## 2016-05-25 DIAGNOSIS — M810 Age-related osteoporosis without current pathological fracture: Secondary | ICD-10-CM

## 2016-05-25 DIAGNOSIS — E7800 Pure hypercholesterolemia, unspecified: Secondary | ICD-10-CM

## 2016-05-25 DIAGNOSIS — R634 Abnormal weight loss: Secondary | ICD-10-CM

## 2016-05-25 LAB — BASIC METABOLIC PANEL
BUN: 30 mg/dL — AB (ref 6–23)
CHLORIDE: 102 meq/L (ref 96–112)
CO2: 29 meq/L (ref 19–32)
CREATININE: 1.53 mg/dL — AB (ref 0.40–1.20)
Calcium: 10.2 mg/dL (ref 8.4–10.5)
GFR: 34.1 mL/min — ABNORMAL LOW (ref >60.00)
Glucose, Bld: 112 mg/dL — ABNORMAL HIGH (ref 70–99)
POTASSIUM: 4.5 meq/L (ref 3.5–5.1)
Sodium: 137 mEq/L (ref 135–145)

## 2016-05-25 LAB — LIPID PANEL
CHOL/HDL RATIO: 2
Cholesterol: 147 mg/dL (ref 0–200)
HDL: 70.8 mg/dL (ref >39.00)
LDL Cholesterol: 58 mg/dL (ref 0–99)
NONHDL: 76.39
Triglycerides: 94 mg/dL (ref 0.0–149.0)
VLDL: 18.8 mg/dL (ref 0.0–40.0)

## 2016-05-25 LAB — CBC WITH DIFFERENTIAL/PLATELET
BASOS PCT: 0.2 % (ref 0.0–3.0)
Basophils Absolute: 0 10*3/uL (ref 0.0–0.1)
EOS PCT: 1.8 % (ref 0.0–5.0)
Eosinophils Absolute: 0.2 10*3/uL (ref 0.0–0.7)
HCT: 38.5 % (ref 36.0–46.0)
Hemoglobin: 12.7 g/dL (ref 12.0–15.0)
LYMPHS ABS: 2.4 10*3/uL (ref 0.7–4.0)
Lymphocytes Relative: 26.1 % (ref 12.0–46.0)
MCHC: 33 g/dL (ref 30.0–36.0)
MCV: 93.8 fl (ref 78.0–100.0)
MONO ABS: 0.8 10*3/uL (ref 0.1–1.0)
MONOS PCT: 9.1 % (ref 3.0–12.0)
NEUTROS PCT: 62.8 % (ref 43.0–77.0)
Neutro Abs: 5.9 10*3/uL (ref 1.4–7.7)
Platelets: 204 10*3/uL (ref 150.0–400.0)
RBC: 4.11 Mil/uL (ref 3.87–5.11)
RDW: 15.1 % (ref 11.5–15.5)
WBC: 9.4 10*3/uL (ref 4.0–10.5)

## 2016-05-25 LAB — HEPATIC FUNCTION PANEL
ALBUMIN: 4.2 g/dL (ref 3.5–5.2)
ALK PHOS: 62 U/L (ref 39–117)
ALT: 13 U/L (ref 0–35)
AST: 20 U/L (ref 0–37)
BILIRUBIN TOTAL: 0.6 mg/dL (ref 0.2–1.2)
Bilirubin, Direct: 0.1 mg/dL (ref 0.0–0.3)
Total Protein: 7.3 g/dL (ref 6.0–8.3)

## 2016-05-25 LAB — HEMOGLOBIN A1C: HEMOGLOBIN A1C: 5.9 % (ref 4.6–6.5)

## 2016-05-25 LAB — VITAMIN D 25 HYDROXY (VIT D DEFICIENCY, FRACTURES): VITD: 57.15 ng/mL (ref 30.00–100.00)

## 2016-05-25 LAB — TSH: TSH: 3.94 u[IU]/mL (ref 0.35–4.50)

## 2016-05-25 NOTE — Telephone Encounter (Signed)
Pt came in for labs today & mentioned the cologuard test. Please advise if you would like for me to fax order.

## 2016-05-25 NOTE — Telephone Encounter (Signed)
If she is agreeable to do cologuard and wants to do it then I am ok to order.  Thanks

## 2016-05-25 NOTE — Telephone Encounter (Signed)
Order faxed.

## 2016-05-26 ENCOUNTER — Encounter: Payer: Self-pay | Admitting: *Deleted

## 2016-06-01 ENCOUNTER — Telehealth: Payer: Self-pay | Admitting: *Deleted

## 2016-06-01 NOTE — Telephone Encounter (Signed)
Please advise?  I am not sure what to do with this. thanks

## 2016-06-01 NOTE — Telephone Encounter (Signed)
[employer] has placed order for the colon guard on hold ,due to patient being outside of the medicare guide lines based on age.  Order number [id number] Contact [phone]

## 2016-06-01 NOTE — Telephone Encounter (Signed)
Attempted to reach patient, left a detailed message with comments. thanks

## 2016-06-01 NOTE — Telephone Encounter (Signed)
Just notify pt that she is outside of medicare guidelines for screening - cologuard, sot they will not be sending for her to do.  Thanks

## 2016-06-13 ENCOUNTER — Telehealth: Payer: Self-pay | Admitting: *Deleted

## 2016-06-13 NOTE — Telephone Encounter (Signed)
Jill Castro from [facility] has requested progress notes to support  reasoning of colon gard testing, due to age. Coverage for this testing is covered up until the age of 50. Fax [phone] Jill Castro [phone]

## 2016-06-13 NOTE — Telephone Encounter (Signed)
Not needed for patient, per md as she is over the age.  Thanks Spoke with Arbie Cookey at [facility].

## 2016-06-21 ENCOUNTER — Telehealth: Payer: Self-pay | Admitting: Internal Medicine

## 2016-06-23 ENCOUNTER — Other Ambulatory Visit: Payer: Self-pay

## 2016-06-23 MED ORDER — TRAMADOL HCL 50 MG PO TABS: 50.0000 mg | ORAL_TABLET | Freq: Two times a day (BID) | ORAL | 0 refills | Status: DC | PRN

## 2016-06-23 NOTE — Telephone Encounter (Signed)
This was done in error for this patient, thanks

## 2016-06-23 NOTE — Telephone Encounter (Signed)
This patient doesn't check her Blood sugar, I believe it is on the wrong patient.

## 2016-06-23 NOTE — Telephone Encounter (Deleted)
error 

## 2016-06-23 NOTE — Telephone Encounter (Signed)
Please advise for a refill on the medication, thanks   Patient wanted to let you know that her husband past away last night.   thanks

## 2016-07-11 ENCOUNTER — Ambulatory Visit: Payer: Medicare Other | Admitting: Cardiovascular Disease

## 2016-07-11 ENCOUNTER — Encounter: Payer: Self-pay | Admitting: Cardiovascular Disease

## 2016-07-11 ENCOUNTER — Ambulatory Visit (INDEPENDENT_AMBULATORY_CARE_PROVIDER_SITE_OTHER): Payer: Medicare Other | Admitting: Cardiovascular Disease

## 2016-07-11 VITALS — BP 172/90 | HR 58 | Ht 64.0 in | Wt 130.8 lb

## 2016-07-11 DIAGNOSIS — E7800 Pure hypercholesterolemia, unspecified: Secondary | ICD-10-CM | POA: Diagnosis not present

## 2016-07-11 DIAGNOSIS — F4321 Adjustment disorder with depressed mood: Secondary | ICD-10-CM

## 2016-07-11 DIAGNOSIS — I159 Secondary hypertension, unspecified: Secondary | ICD-10-CM

## 2016-07-11 DIAGNOSIS — R609 Edema, unspecified: Secondary | ICD-10-CM | POA: Diagnosis not present

## 2016-07-11 DIAGNOSIS — R441 Visual hallucinations: Secondary | ICD-10-CM

## 2016-07-11 DIAGNOSIS — I5032 Chronic diastolic (congestive) heart failure: Secondary | ICD-10-CM

## 2016-07-11 MED ORDER — HYDRALAZINE HCL 100 MG PO TABS: 50.0000 mg | ORAL_TABLET | Freq: Four times a day (QID) | ORAL | 6 refills | Status: DC

## 2016-07-11 NOTE — Patient Instructions (Addendum)
Medication Instructions:   Please continue hydralazine 50 mg four times a day Extra hydralazine if needed for high blood pressure  Labwork:  No new labs  Testing/Procedures:  No new testing  Follow-Up: It was a pleasure seeing you in the office today. Please call us if you have new issues that need to be addressed before your next appt.  [phone]  Your physician wants you to follow-up in: 3 months.  You will receive a reminder letter in the mail two months in advance. If you don't receive a letter, please call our office to schedule the follow-up appointment.  If you need a refill on your cardiac medications before your next appointment, please call your pharmacy.

## 2016-07-11 NOTE — Progress Notes (Signed)
Cardiology Office Note  Date:  07/11/2016   ID:  Jill Castro, DOB [birth year], MRN [id number]  PCP:  Einar Pheasant, MD   Chief Complaint  Patient presents with  . Other    6 month follow up. Meds reviewed by the patient verbally. "doing well."    HPI:  Jill Castro is an 82 yo pleasant woman with history of HTN and diastolic CHF, chronic lower extremity edema (left leg in particular), presents for followup of her hypertension and diastolic CHF  She has severe scoliosis and chronic back pain and requires pain medication.  Jill Castro recently diagnosed with pancreatic cancer, he is on chemotherapy and radiation treatment at [facility]  In follow-up today, significant stress after the loss of her Jill Castro end of Jun 06, 2015 Passed away from pancreatic cancer She has been "seeing people", unclear if this is daytime or nighttime Reports that she waves her hand at them and they seem to go away without intervention Poor sleep, reports that she was seeing people for her Jill Castro passed She was concerned hydralazine could be causing her to see things and she cut the dose down to 50 mg 3 times a day, does not take 1 dose Blood pressures that she has been recording at home shows systolic pressure typically [id number] up to 0000000 systolic  Recent weight loss since the loss of her Jill Castro, 19 pounds since the beginning of the year  Chronic back pain, lives alone, trying to do her ADLs Recent lab work showing Creatinine 1.5 Lasix three times a week Perhaps was not drinking much last month when last lab work was done  Chronic shortness of breath on exertion, no regular exercise program, deconditioned in general She has been putting off back surgery as Jill Castro was having cancer treatment  EKG on today's visit shows normal sinus rhythm with rate 58 bpm, no significant ST or T-wave changes  Other past medical history Chronic back pain, not a candidate for back surgery Continued eye problem, macular  degeneration. Baseline Creatinine 1.4 Tries to wear compression hose when she can.   catheterization in 2003 showing luminal irregularities,  negative stress test in October 2010 with no ischemia, echocardiogram August 2010 showing normal systolic function, mild to moderate tricuspid regurgitation, mildly elevated right ventricular systolic pressures consistent with mild pulmonary hypertension, ejection fraction 60%.   PMH:   has a past medical history of Diastolic CHF, acute ([place]); Dizziness; GERD (gastroesophageal reflux disease); Glaucoma; HTN (hypertension); Hypercalcemia; Hypercholesterolemia; Hyperkalemia; Macular degeneration; Osteoporosis; Renal fibromuscular dysplasia ([place]); Renal insufficiency; S/P cardiac catheterization (06/05/2002); Scoliosis associated with other condition; and Uterine cancer ([place]).  PSH:    Past Surgical History:  Procedure Laterality Date  . CARDIAC CATHETERIZATION     Left  . CATARACT EXTRACTION Right   . REFRACTIVE SURGERY    . 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 aspirin 81 MG EC tablet Take 81 mg by mouth daily.      . B Complex Vitamins (PA B-COMPLEX WITH B-12) TABS Take 1 tablet by mouth daily.      . brimonidine (ALPHAGAN P) 0.1 % SOLN Place 1 drop into the left eye 2 (two) times daily.    Marland Kitchen BYSTOLIC 5 MG tablet TAKE 1 TABLET  BY MOUTH 2 TIMES DAILY. 60 tablet 6  . Calcium Carbonate-Vitamin D (CALCIUM-VITAMIN D) 600-200 MG-UNIT CAPS Take by mouth. Takes 1/2 tablet  daily.    . dorzolamide-timolol (COSOPT) 22.3-6.8 MG/ML ophthalmic solution Place 1 drop into the left eye 2 (two) times daily.     . furosemide (LASIX) 40 MG tablet TAKE 1 TABLET (40 MG TOTAL) BY MOUTH DAILY. (Patient taking differently: TAKE 1 TABLET (40 MG TOTAL) BY MOUTH DAILY AS NEEDED.) 90 tablet 3  . Glucosamine-Chondroitin 1500-1200  MG/30ML LIQD Take by mouth. Takes 1/2 tablet daily.    . hydrALAZINE (APRESOLINE) 100 MG tablet Take 1 tablet (100 mg total) by mouth 4 (four) times daily. (Patient taking differently: Take 50 mg by mouth 4 (four) times daily. 100mg -breakfast, 50mg -dinner, 100mg -bedtime) 120 tablet 11  . latanoprost (XALATAN) 0.005 % ophthalmic solution Place 1 drop into the left eye at bedtime.     Marland Kitchen losartan (COZAAR) 100 MG tablet TAKE 1 TABLET BY MOUTH DAILY. 30 tablet 2  . Magnesium 250 MG TABS Take 250 mg by mouth daily.    . multivitamin (THERAGRAN) per tablet Take 1 tablet by mouth daily.      . nebivolol (BYSTOLIC) 5 MG tablet TAKE 1 TABLET  BY MOUTH 2 TIMES DAILY. 180 tablet 3  . omeprazole (PRILOSEC) 20 MG capsule TAKE 1 CAPSULE  (20 MG TOTAL) BY MOUTH DAILY. 30 capsule 0  . Polyethylene Glycol 3350 (MIRALAX PO) Take by mouth daily.     . ranitidine (ZANTAC) 75 MG tablet Take 75 mg by mouth at bedtime.    . simvastatin (ZOCOR) 10 MG tablet TAKE 1 TABLET  BY MOUTH AT BEDTIME. 90 tablet 2  . traMADol (ULTRAM) 50 MG tablet Take 1 tablet (50 mg total) by mouth 2 (two) times daily as needed. 60 tablet 0  . vitamin C (ASCORBIC ACID) 500 MG tablet Take 500 mg by mouth daily.       No current facility-administered medications for this visit.      Allergies:   Benicar [olmesartan]; Celebrex [celecoxib]; and Penicillins   Social History:  The patient  reports that she has never smoked. She has never used smokeless tobacco. She reports that she does not drink alcohol or use drugs.   Family History:   family history includes Diabetes in her cousin; Heart disease in her father and mother; Parkinson's disease in her mother.    Review of Systems: Review of Systems  Constitutional: Negative.   Respiratory: Negative.   Cardiovascular: Negative.   Gastrointestinal: Negative.   Musculoskeletal: Positive for back pain.       Unsteady gait  Neurological: Negative.        "Seeing people"   Psychiatric/Behavioral: Negative.   All other systems reviewed and are negative.    PHYSICAL EXAM: VS:  BP (!) 172/90 (BP Location: Left Arm, Patient Position: Sitting, Cuff Size: Normal)   Pulse (!) 58   Ht 5\' 4"  (1.626 m)   Wt 130 lb 12 oz (59.3 kg)   BMI 22.44 kg/m  , BMI Body mass index is 22.44 kg/m. GEN: Frail, walks with a cane, kyphosis HEENT: normal  Neck: no JVD, carotid bruits, or masses Cardiac: RRR; no murmurs, rubs, or gallops, mild nonpitting edema left lower extremity Respiratory:  clear to auscultation bilaterally, normal work of breathing GI: soft, nontender, nondistended, + BS MS: no deformity or atrophy  Skin: warm and dry, no rash Neuro:  Strength and sensation are intact Psych: euthymic mood, full affect    Recent Labs: 05/25/2016: ALT 13; BUN 30; Creatinine, Ser 1.53; Hemoglobin 12.7; Platelets 204.0; Potassium 4.5; Sodium 137; TSH 3.94  Lipid Panel Lab Results  Component Value Date   CHOL 147 05/25/2016   HDL 70.80 05/25/2016   LDLCALC 58 05/25/2016   TRIG 94.0 05/25/2016      Wt Readings from Last 3 Encounters:  07/11/16 130 lb 12 oz (59.3 kg)  05/10/16 135 lb 6 oz (61.4 kg)  12/17/15 149 lb (67.6 kg)       ASSESSMENT AND PLAN:  DIASTOLIC HEART FAILURE, CHRONIC - Plan: EKG 12-Lead Appears mildly dry even with Lasix 3 times per week Recommended she try to drink more No overt signs of heart failure  Secondary hypertension, unspecified - Plan: EKG 12-Lead Recommended she start back her hydralazine 50 mg at noon She cut the dose back from 100 down to 50 on her own Recommended she take extra 50 for  systolic pressures more than 150  Adjustment disorder  Recent loss of her Jill Castro, not sleeping well, weight loss Long discussion concerning recent loss of her Jill Castro She lives alone in her house, hard to manage on her own She does have a housecleaner, needs help in the garden Unclear if she may benefit from living facility such as  [facility] [place]  "Seeing people"/visual hallucinations Etiology is unclear. She decrease the dose of hydralazine in half with no improvement of her symptoms. Symptoms seem to start before the loss of her Jill Castro. Unclear if this is stress related or some other neurologic issue    Disposition:   F/U  6 months  Orders Placed This Encounter  Procedures  . EKG 12-Lead     Signed, Esmond Plants, M.D., Ph.D. 07/11/2016  [place], [place]

## 2016-07-15 ENCOUNTER — Other Ambulatory Visit: Payer: Self-pay | Admitting: Internal Medicine

## 2016-07-20 ENCOUNTER — Ambulatory Visit (INDEPENDENT_AMBULATORY_CARE_PROVIDER_SITE_OTHER): Payer: Medicare Other | Admitting: Internal Medicine

## 2016-07-20 ENCOUNTER — Encounter: Payer: Self-pay | Admitting: Internal Medicine

## 2016-07-20 VITALS — BP 110/84 | HR 65 | Temp 98.1°F | Resp 17 | Ht 64.0 in | Wt 131.0 lb

## 2016-07-20 DIAGNOSIS — R350 Frequency of micturition: Secondary | ICD-10-CM

## 2016-07-20 DIAGNOSIS — C541 Malignant neoplasm of endometrium: Secondary | ICD-10-CM

## 2016-07-20 DIAGNOSIS — I159 Secondary hypertension, unspecified: Secondary | ICD-10-CM

## 2016-07-20 DIAGNOSIS — E7800 Pure hypercholesterolemia, unspecified: Secondary | ICD-10-CM

## 2016-07-20 DIAGNOSIS — R634 Abnormal weight loss: Secondary | ICD-10-CM | POA: Diagnosis not present

## 2016-07-20 DIAGNOSIS — R441 Visual hallucinations: Secondary | ICD-10-CM

## 2016-07-20 DIAGNOSIS — G8929 Other chronic pain: Secondary | ICD-10-CM

## 2016-07-20 DIAGNOSIS — N183 Chronic kidney disease, stage 3 unspecified: Secondary | ICD-10-CM

## 2016-07-20 DIAGNOSIS — K2100 Gastro-esophageal reflux disease with esophagitis, without bleeding: Secondary | ICD-10-CM

## 2016-07-20 DIAGNOSIS — M549 Dorsalgia, unspecified: Secondary | ICD-10-CM

## 2016-07-20 LAB — BASIC METABOLIC PANEL
BUN: 37 mg/dL — AB (ref 6–23)
CHLORIDE: 102 meq/L (ref 96–112)
CO2: 25 meq/L (ref 19–32)
CREATININE: 1.47 mg/dL — AB (ref 0.40–1.20)
Calcium: 9.9 mg/dL (ref 8.4–10.5)
GFR: 35.7 mL/min — ABNORMAL LOW (ref >60.00)
Glucose, Bld: 117 mg/dL — ABNORMAL HIGH (ref 70–99)
Potassium: 4.4 mEq/L (ref 3.5–5.1)
Sodium: 134 mEq/L — ABNORMAL LOW (ref 135–145)

## 2016-07-20 LAB — URINALYSIS, ROUTINE W REFLEX MICROSCOPIC
Bilirubin Urine: NEGATIVE
HGB URINE DIPSTICK: NEGATIVE
Ketones, ur: NEGATIVE
Leukocytes, UA: NEGATIVE
Nitrite: NEGATIVE
Specific Gravity, Urine: 1.01 (ref 1.000–1.030)
Total Protein, Urine: 100 — AB
URINE GLUCOSE: NEGATIVE
Urobilinogen, UA: 0.2 (ref 0.0–1.0)
pH: 6 (ref 5.0–8.0)

## 2016-07-20 NOTE — Progress Notes (Signed)
Pre-visit discussion using our clinic review tool. No additional management support is needed unless otherwise documented below in the visit note.  

## 2016-07-20 NOTE — Progress Notes (Signed)
[name], female   DOB: Apr 20, 1927, 82 y.o.   MRN: [id number]   Subjective:    [name], female    DOB: [birth year], 82 y.o.   MRN: [id number]  HPI  Patient here for a scheduled follow up.  Husband recently passed away.  Increased stress related to this.  Discussed with her today.  She has good support from her church and her family.  Does not feel she needs anything more at this time.  States able to do what she needs to do around her house.  Tries to stay as active as possible.  Limited by her msk issues.  No chest pain.  Breathing stable.  No abdominal pain.  Just saw Dr Rockey Situ.  See note.  No changes made.  Does report visual hallucinations.  Reports seeing people.  Only occurs when she awakens, I.e., at night when has to go to bathroom.  Does not scare her.  States goes away when she focuses.     Past Medical History:  Diagnosis Date  . Diastolic CHF, acute (HCC)    Echo (8/10) with EF 60-65%, mild LVH, mild to moderate TR, PASP 40 mmHg.  . Dizziness    Thought to be side effect of Norvasc  . GERD (gastroesophageal reflux disease)   . Glaucoma   . HTN (hypertension)   . Hypercalcemia    On HCTZ  . Hypercholesterolemia   . Hyperkalemia   . Macular degeneration   . Osteoporosis   . Renal fibromuscular dysplasia ([place])   . Renal insufficiency   . S/P cardiac catheterization 05/2002   Luminal irregularities only in the coronaries. EF 55%. There was some irregulatrity in the right renal artery suggestive of possible fibromuscular dysplasia. Myoview in 5/08 showed no ischemia or infarction. Lexiscan myoview at [facility] (10/10) showed EF 55%, normal wall motion, no evience ofor ischemia or infarction.  . Scoliosis associated with other condition    Chronic back pain  . Uterine cancer ([place])    S/P hypsterectomy in 2008   Past Surgical History:  Procedure Laterality Date  . CARDIAC CATHETERIZATION     Left  . CATARACT EXTRACTION Right   .  REFRACTIVE SURGERY    . TOTAL ABDOMINAL HYSTERECTOMY W/ BILATERAL SALPINGOOPHORECTOMY     stage I C well differentiated adenocarcinoma   Family History  Problem Relation Age of Onset  . Heart disease Father   . Heart disease Mother   . Parkinson's disease Mother   . Diabetes Cousin    Social History   Social History  . Marital status: Widowed    Spouse name: N/A  . Number of children: 2  . Years of education: N/A   Social History Main Topics  . Smoking status: Never Smoker  . Smokeless tobacco: Never Used  . Alcohol use No  . Drug use: No  . Sexual activity: Not Asked   Other Topics Concern  . None   Social History Narrative   Lives with husband in [place]    Outpatient Encounter Prescriptions as of 07/20/2016  Medication Sig  . aspirin 81 MG EC tablet Take 81 mg by mouth daily.    . B Complex Vitamins (PA B-COMPLEX WITH B-12) TABS Take 1 tablet by mouth daily.    . brimonidine (ALPHAGAN P) 0.1 % SOLN Place 1 drop into the left eye 2 (two) times daily.  Marland Kitchen BYSTOLIC 5 MG tablet TAKE 1 TABLET  BY MOUTH 2 TIMES DAILY.  Marland Kitchen  Calcium Carbonate-Vitamin D (CALCIUM-VITAMIN D) 600-200 MG-UNIT CAPS Take by mouth. Takes 1/2 tablet daily.  . dorzolamide-timolol (COSOPT) 22.3-6.8 MG/ML ophthalmic solution Place 1 drop into the left eye 2 (two) times daily.   . furosemide (LASIX) 40 MG tablet TAKE 1 TABLET (40 MG TOTAL) BY MOUTH DAILY. (Patient taking differently: TAKE 1 TABLET (40 MG TOTAL) BY MOUTH DAILY AS NEEDED.)  . Glucosamine-Chondroitin 1500-1200 MG/30ML LIQD Take by mouth. Takes 1/2 tablet daily.  . hydrALAZINE (APRESOLINE) 100 MG tablet Take 0.5 tablets (50 mg total) by mouth 4 (four) times daily.  Marland Kitchen latanoprost (XALATAN) 0.005 % ophthalmic solution Place 1 drop into the left eye at bedtime.   Marland Kitchen losartan (COZAAR) 100 MG tablet TAKE 1 TABLET BY MOUTH DAILY.  . Magnesium 250 MG TABS Take 250 mg by mouth daily.  . multivitamin (THERAGRAN) per tablet Take 1 tablet by mouth daily.     . nebivolol (BYSTOLIC) 5 MG tablet TAKE 1 TABLET  BY MOUTH 2 TIMES DAILY.  Marland Kitchen omeprazole (PRILOSEC) 20 MG capsule TAKE ONE CAPSULE BY MOUTH DAILY  . Polyethylene Glycol 3350 (MIRALAX PO) Take by mouth daily.   . ranitidine (ZANTAC) 75 MG tablet Take 75 mg by mouth at bedtime.  . simvastatin (ZOCOR) 10 MG tablet TAKE 1 TABLET  BY MOUTH AT BEDTIME.  . traMADol (ULTRAM) 50 MG tablet Take 1 tablet (50 mg total) by mouth 2 (two) times daily as needed.  . vitamin C (ASCORBIC ACID) 500 MG tablet Take 500 mg by mouth daily.     No facility-administered encounter medications on file as of 07/20/2016.     Review of Systems  Constitutional:       Weight relatively stable from her recent check, but decreased from check prior.  Some decreased appetite.    HENT: Negative for congestion and sinus pressure.   Respiratory: Negative for cough, chest tightness and shortness of breath.   Cardiovascular: Negative for chest pain and palpitations.       Chronic ankle swelling.    Gastrointestinal: Negative for abdominal pain, nausea and vomiting.  Genitourinary: Positive for frequency. Negative for difficulty urinating.  Musculoskeletal: Positive for back pain. Negative for myalgias.  Skin: Negative for color change and rash.  Neurological: Negative for dizziness, light-headedness and headaches.  Psychiatric/Behavioral: Negative for agitation.       Increased stress as outlined.         Objective:     Blood pressure rechecked by me:  150-152/72  Physical Exam  Constitutional: She appears well-developed and well-nourished. No distress.  HENT:  Nose: Nose normal.  Mouth/Throat: Oropharynx is clear and moist.  Neck: Neck supple. No thyromegaly present.  Cardiovascular: Normal rate and regular rhythm.   Pulmonary/Chest: Breath sounds normal. No respiratory distress. She has no wheezes.  Abdominal: Soft. Bowel sounds are normal. There is no tenderness.  Musculoskeletal: She exhibits no tenderness.    Stable swelling ankle.   Lymphadenopathy:    She has no cervical adenopathy.  Skin: No rash noted. No erythema.  Psychiatric: She has a normal mood and affect. Her behavior is normal.    BP 110/84   Pulse 65   Temp 98.1 F (36.7 C) (Oral)   Resp 17   Ht '5\' 4"'  (1.626 m)   Wt 131 lb (59.4 kg)   SpO2 95%   BMI 22.49 kg/m  Wt Readings from Last 3 Encounters:  07/20/16 131 lb (59.4 kg)  07/11/16 130 lb 12 oz (59.3 kg)  05/10/16 135 lb  6 oz (61.4 kg)     Lab Results  Component Value Date   WBC 9.4 05/25/2016   HGB 12.7 05/25/2016   HCT 38.5 05/25/2016   PLT 204.0 05/25/2016   GLUCOSE 117 (H) 07/20/2016   CHOL 147 05/25/2016   TRIG 94.0 05/25/2016   HDL 70.80 05/25/2016   LDLCALC 58 05/25/2016   ALT 13 05/25/2016   AST 20 05/25/2016   NA 134 (L) 07/20/2016   K 4.4 07/20/2016   CL 102 07/20/2016   CREATININE 1.47 (H) 07/20/2016   BUN 37 (H) 07/20/2016   CO2 25 07/20/2016   TSH 3.94 05/25/2016   HGBA1C 5.9 05/25/2016    Ct Lumbar Spine Wo Contrast  Result Date: 10/13/2015 CLINICAL DATA:  82 year old female with L3 vertebral fracture. Chronic lumbar back pain. Chronic scoliosis. Fall 6-8 months ago. Subsequent encounter. EXAM: CT LUMBAR SPINE WITHOUT CONTRAST TECHNIQUE: Multidetector CT imaging of the lumbar spine was performed without intravenous contrast administration. Multiplanar CT image reconstructions were also generated. COMPARISON:  Lumbar MRI 07/30/2015. CT Abdomen and Pelvis 09/02/2010. FINDINGS: Osteopenia. Moderate to severe dextro convex lumbar scoliosis, not significantly changed since 2011. Right lateral listhesis of L3 on L4 measuring up to 9 mm. L3 inferior endplate compression fracture re- demonstrated with sclerosis and mild cortical deformity (series 8, image 31 and series 9, image 34). Less than 10% loss of vertebral body height. No significant retropulsion of bone. L3 pedicles and posterior elements appear intact. No other acute or subacute osseous  abnormality identified. Visible sacrum and SI joints appear intact. Widespread vacuum disc phenomena. Widespread lumbar endplate spurring. Widespread lumbar facet hypertrophy. Mild multifactorial spinal stenosis at the L3-L4 level (series 7, image 75) is stable. Chronic large hiatal hernia, renal cysts, calcified aortic atherosclerosis. Paraspinal muscle atrophy. IMPRESSION: 1. Subacute L3 inferior endplate compression fracture re- demonstrated with less than 10% loss of vertebral body height, and no significant retropulsion of bone or complicating features. 2. Underlying osteopenia and chronic moderate to severe lumbar scoliosis with widespread disc, endplate, and facet degeneration. Stable mild multifactorial spinal stenosis at L3-L4. 3. Chronic large hiatal hernia, calcified aortic atherosclerosis. Electronically Signed   By: Genevie Ann M.D.   On: 10/13/2015 16:02       Assessment & Plan:   Problem List Items Addressed This Visit    Chronic back pain    Taking tramadol.  Tolerating.  Stable.  Follow.        Chronic kidney disease (CKD), stage III (moderate)    Followed by nephrology.  Recheck met b today.        Relevant Orders   Basic metabolic panel (Completed)   Endometrial cancer [facility])    Has been seeing Dr Ouida Sills.  Declines further evaluation/testing.        GERD (gastroesophageal reflux disease)    Controlled on current regimen.  Follow.        Hypercholesterolemia    On simvastatin.  Low cholesterol diet and exercise.  Follow lipid panel and liver function tests.        Hypertension    Blood pressure as outlined.  Hold on changing medications.  Follow.        Loss of weight    Discussed with her today.  Try to increase po intake.  Wants to hold on any further testing at this time.  Follow.        Visual hallucinations    Has worsened since her husband has passed.  Discussed with her today.  Discussed further w/up and evaluation.  She declines.  Follow.           Other Visit Diagnoses    Urinary frequency    -  Primary   check urinalysis to confirm no infection.    Relevant Orders   Urinalysis, Routine w reflex microscopic (not at [facility]) (Completed)   CULTURE, URINE COMPREHENSIVE       Einar Pheasant, MD

## 2016-07-21 ENCOUNTER — Encounter: Payer: Self-pay | Admitting: Internal Medicine

## 2016-07-21 DIAGNOSIS — R441 Visual hallucinations: Secondary | ICD-10-CM | POA: Insufficient documentation

## 2016-07-21 NOTE — Assessment & Plan Note (Signed)
Blood pressure as outlined.  Hold on changing medications.  Follow.

## 2016-07-21 NOTE — Assessment & Plan Note (Signed)
Followed by nephrology.  Recheck met b today.

## 2016-07-21 NOTE — Assessment & Plan Note (Signed)
Taking tramadol.  Tolerating.  Stable.  Follow.

## 2016-07-21 NOTE — Assessment & Plan Note (Signed)
Controlled on current regimen.  Follow.  

## 2016-07-21 NOTE — Assessment & Plan Note (Signed)
Has been seeing Dr Ouida Sills.  Declines further evaluation/testing.

## 2016-07-21 NOTE — Assessment & Plan Note (Signed)
Has worsened since her husband has passed.  Discussed with her today.  Discussed further w/up and evaluation.  She declines.  Follow.

## 2016-07-21 NOTE — Assessment & Plan Note (Signed)
Discussed with her today.  Try to increase po intake.  Wants to hold on any further testing at this time.  Follow.

## 2016-07-21 NOTE — Assessment & Plan Note (Signed)
On simvastatin.  Low cholesterol diet and exercise.  Follow lipid panel and liver function tests.   

## 2016-07-22 ENCOUNTER — Other Ambulatory Visit: Payer: Self-pay | Admitting: Cardiovascular Disease

## 2016-07-23 LAB — CULTURE, URINE COMPREHENSIVE

## 2016-07-25 ENCOUNTER — Telehealth: Payer: Self-pay | Admitting: Internal Medicine

## 2016-07-25 NOTE — Telephone Encounter (Signed)
Due to her kidney function, she is unable to take mobic.  She can take scheduled tylenol.  She also sees Dr Sharlet Salina.  I can get her referred back for evaluation and question of need for injection.

## 2016-07-25 NOTE — Telephone Encounter (Signed)
Spoke with the patient, she will try scheduled Tylenol for now. thanks

## 2016-07-25 NOTE — Telephone Encounter (Signed)
Noted, thanks!

## 2016-07-25 NOTE — Telephone Encounter (Signed)
Spoke with the patient, she is having sicatic nerve pain that is waking her up at night.  Her left leg is affected and very painful.  States she already takes Tramadol twice a day and last night took her tramadol and 2 tylenol and it was little to no help.  She wanted to know if something like mobic would be warranting?  He pain remains this am, and it just gets worse in the PM. Thanks

## 2016-07-25 NOTE — Telephone Encounter (Signed)
Pt called about wanting something for the sciatica nerve. Pt suggested a antiinflammatory if possible. Pt has not sleep in two nights. Please advise?   Pharmacy is [place], [place]  Call pt @ [phone]. Thank you!

## 2016-07-25 NOTE — Telephone Encounter (Signed)
If persistent pain, will need to be evaluated.

## 2016-07-28 NOTE — Telephone Encounter (Signed)
Spoke with the patient, she has been trying the Tylenol around the clock, it is not helping.  She wants to know about an antiinflammatory or even possibly acupuncture?  She is using a heating pad with minimal relief, she did verbalize that you didn't want her to have mobic and agree.

## 2016-07-28 NOTE — Telephone Encounter (Signed)
Spoke with the patient.   Saw a back doctor at [place], told her to never put cortisone in her back, so she is not wanting to go back to [place], she went to the pain clinic and all they wanted to do was the injection.  She will not do that.  She will think about it.   She is using the heating pad (two of them) and will continue with that for now. thanks

## 2016-07-28 NOTE — Telephone Encounter (Signed)
She has tramadol and she can take this medication with tylenol.  I want her to avoid mobic and all antiinflammatories - because of her kidneys.  If she is having such increased pain, we can get her back in with Dr Sharlet Salina.  Just let me know.

## 2016-07-28 NOTE — Telephone Encounter (Signed)
Pt left another vm stating that she needs to speak to Dr. Nicki Reaper or her nurse.

## 2016-07-29 ENCOUNTER — Telehealth: Payer: Self-pay | Admitting: *Deleted

## 2016-07-29 NOTE — Telephone Encounter (Signed)
Patient called and is inquiring about her lab results . She is requesting a call back. Her number is [phone]. Please advise. Thank you

## 2016-07-29 NOTE — Telephone Encounter (Signed)
Patient was informed of results.  Patient understood and no questions, comments, or concerns at this time.  

## 2016-08-05 ENCOUNTER — Telehealth: Payer: Self-pay | Admitting: *Deleted

## 2016-08-05 NOTE — Telephone Encounter (Signed)
Just confirm with pt no acute symptoms.  If any acute symptoms, evaluation today.  Confirm how she is feeling now.  If ok, and pressure running a little higher - I would like to get her a f/u appt with nephrology to see about adjustment in medication and then we will follow up.  Let me know if problems.

## 2016-08-05 NOTE — Telephone Encounter (Signed)
Spoke with the patient, she got up this am and was sure htat her BP was high, as stated below, she took a 50mg  Hydralazine at 4am, then at 430 took her am meds (Bystolic and Losartan) and then at noon checked her BP it was 170/80 Heart rate 56, she took an additional 50mg  of hydralazine, then at 1230 took her BP and it was 162/81 heart rate 58, took another 50mg  of hydralazine.  She is wanting to know what to do now.  She has a hospice Education officer, [employer] coming this afternoon to talk with her regarding grief counseling since the death in her family.  I explained that PCP would like her to follow up with nephrologist, she will call them now to schedule an appt.  She is very ancy and not sleeping well contributes everything to her anxiety and grief.

## 2016-08-05 NOTE — Telephone Encounter (Signed)
Blood pressure is starting to trend down.  Inform her to take her bp medications as directed.  If increased stress, etc, then can schedule appt to discuss other treatment.  If any acute changes - evaluation as noted previously.

## 2016-08-05 NOTE — Telephone Encounter (Signed)
Pt reported having problems with her BP this morning. Bp reading was 175/90 at 4am, she took a dose of hydralazine. She re-checked her Bp at 7:40 this morning, and the reading was 175/81. Pt questioned if she take another dose of hydralazine. Pt contact [phone]

## 2016-08-05 NOTE — Telephone Encounter (Signed)
Patient stated she has been consistently high blood pressure the past week. Patient pressed her life alert and the EMS told her she was having a panic attack.  Patient is having no  chest tightness, pain or any cardiac SX.  Patient is very concerned. Schedule Appointment? Please advise.

## 2016-08-08 NOTE — Telephone Encounter (Signed)
Patient returned my call, she states that her BP did much better over the weekend. She declines a follow up appt at this time.  She will call back if things change. thanks

## 2016-08-08 NOTE — Telephone Encounter (Signed)
Attempted to reach patient, left a VM to return my call, thanks 

## 2016-08-24 ENCOUNTER — Telehealth: Payer: Self-pay

## 2016-08-24 NOTE — Telephone Encounter (Signed)
PA for Simvastatin completed on Cover my meds.

## 2016-08-26 NOTE — Telephone Encounter (Signed)
Noted  

## 2016-08-26 NOTE — Telephone Encounter (Signed)
Simvastatin is a generic medication and should be on 4 dollar list at [place].

## 2016-08-26 NOTE — Telephone Encounter (Signed)
PA denied as it is a benefit exclusion. Please advise, thanks

## 2016-08-29 NOTE — Telephone Encounter (Signed)
Pt requested a call to discuss this issue.  Pt contact [phone]

## 2016-08-29 NOTE — Telephone Encounter (Signed)
Spoke with the patient, she is going to call her insurance to see if something has changed.  She paid out of pocket for it already, but will let us know if we need to do anything different. thanks

## 2016-09-13 ENCOUNTER — Telehealth: Payer: Self-pay | Admitting: Internal Medicine

## 2016-09-13 NOTE — Telephone Encounter (Signed)
[facility]  Patient Name: Jill Castro  DOB: 02-24-1927    Initial Comment Caller states, she has not been feeling well and has taken her bp rx. and took it again just now. bp-176/91. She feels flushed. Verified    Nurse Assessment  Nurse: Wynetta Emery, RN, Baker Janus Date/Time Eilene Ghazi Time): 09/13/2016 4:06:47 PM  Confirm and document reason for call. If symptomatic, describe symptoms. You must click the next button to save text entered. ---Alexiana had 155/78 @6am  and again at 12n 175/79 and again at 345pm 176/91 feels not well flushed cheeks and feels like head has pain in it. could become a headache. urination more frequently  Has the patient traveled out of the country within the last 30 days? ---No  Does the patient have any new or worsening symptoms? ---Yes  Will a triage be completed? ---Yes  Related visit to physician within the last 2 weeks? ---No  Does the PT have any chronic conditions? (i.e. diabetes, asthma, etc.) ---Yes  List chronic conditions. ---HTN Scoliosis' -back pain renal function low one kidney  Is this a behavioral health or substance abuse call? ---No     Guidelines    Guideline Title Affirmed Question Affirmed Notes  High Blood Pressure BP ? 160/100    Final Disposition User   See Physician within 24 Hours [place], RN, Baker Janus    Comments  NOTE Dr. Nicki Reaper is totally booked today and tomorrow feels patient needs to be seen; is anxious sounding blood pressure slowly creeping up throughout the day and she is getting concerned -- states it is hard to be without her spouse -- Nurse concerned she is grieving loss of spouse and her uncertainty without him; no symptoms of anything flushed and not feeling well but asked to describe them she can't -- that is when she told me about her spouse passing 2 months ago. Thersa Salt, DO Has appt at 830am available and will put her with him to check  her out.   Referrals  REFERRED TO PCP OFFICE   Disagree/Comply: Comply

## 2016-09-13 NOTE — Telephone Encounter (Signed)
Pt called stating she feels flush and her BP is 176/91. Pt was transferred to [facility]. Thank you!

## 2016-09-13 NOTE — Telephone Encounter (Signed)
fyi

## 2016-09-13 NOTE — Telephone Encounter (Signed)
Patient is scheduled with Dr.Cook

## 2016-09-14 ENCOUNTER — Ambulatory Visit: Payer: Self-pay | Admitting: Family Medicine

## 2016-09-21 ENCOUNTER — Other Ambulatory Visit: Payer: Self-pay | Admitting: Internal Medicine

## 2016-10-10 ENCOUNTER — Encounter: Payer: Self-pay | Admitting: Cardiovascular Disease

## 2016-10-10 ENCOUNTER — Ambulatory Visit (INDEPENDENT_AMBULATORY_CARE_PROVIDER_SITE_OTHER): Payer: Medicare Other | Admitting: Cardiovascular Disease

## 2016-10-10 VITALS — BP 162/80 | HR 59 | Ht 64.0 in | Wt 131.0 lb

## 2016-10-10 DIAGNOSIS — N183 Chronic kidney disease, stage 3 unspecified: Secondary | ICD-10-CM

## 2016-10-10 DIAGNOSIS — I10 Essential (primary) hypertension: Secondary | ICD-10-CM

## 2016-10-10 DIAGNOSIS — F4329 Adjustment disorder with other symptoms: Secondary | ICD-10-CM

## 2016-10-10 DIAGNOSIS — R0609 Other forms of dyspnea: Secondary | ICD-10-CM

## 2016-10-10 DIAGNOSIS — I5032 Chronic diastolic (congestive) heart failure: Secondary | ICD-10-CM | POA: Diagnosis not present

## 2016-10-10 NOTE — Patient Instructions (Signed)

## 2016-10-10 NOTE — Progress Notes (Signed)
Cardiology Office Note  Date:  10/10/2016   ID:  Jill Castro, DOB 1926/12/19, MRN [id number]  PCP:  Einar Pheasant, MD   Chief Complaint  Patient presents with  . other    Pt's husband past away 4 months ago today is a 3 month f/u no complaints today. Meds reviewed verbally with pt.    HPI:  Ms. Sammons is an 82 yo pleasant woman with history of HTN and diastolic CHF, chronic lower extremity edema (left leg in particular), presents for followup of her hypertension and diastolic CHF  She has severe scoliosis and chronic back pain and requires pain medication.  Husband passed Recently from pancreatic cancer  Lots of paperwork since her husband passed Son lives in [place], daughter in [place] Good days bad days Requiring assistance from neighbors to drive and help with groceries Thinking of hiring someone part-time to help with chores  Went to chiropractic, did not help back pain BP elevated in AM, 160s, Takes hydralazine 50 mg 4 times a day, losartan, bystolic twice a day Blood pressure up on "bad days" But slowly doing better  For years has been putting off back surgery  Other past medical history Chronic back pain, not a candidate for back surgery Continued eye problem, macular degeneration. Baseline Creatinine 1.4 Tries to wear compression hose when she can.   catheterization in 2003 showing luminal irregularities,  negative stress test in October 2010 with no ischemia, echocardiogram August 2010 showing normal systolic function, mild to moderate tricuspid regurgitation, mildly elevated right ventricular systolic pressures consistent with mild pulmonary hypertension, ejection fraction 60%.  PMH:   has a past medical history of Diastolic CHF, acute ([place]); Dizziness; GERD (gastroesophageal reflux disease); Glaucoma; HTN (hypertension); Hypercalcemia; Hypercholesterolemia; Hyperkalemia; Macular degeneration; Osteoporosis; Renal fibromuscular dysplasia ([place]); Renal  insufficiency; S/P cardiac catheterization (05/2002); Scoliosis associated with other condition; and Uterine cancer ([place]).  PSH:    Past Surgical History:  Procedure Laterality Date  . CARDIAC CATHETERIZATION     Left  . CATARACT EXTRACTION Right   . REFRACTIVE SURGERY    . 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 aspirin 81 MG EC tablet Take 81 mg by mouth daily.      . B Complex Vitamins (PA B-COMPLEX WITH B-12) TABS Take 1 tablet by mouth daily.      . brimonidine (ALPHAGAN P) 0.1 % SOLN Place 1 drop into the left eye 2 (two) times daily.    Marland Kitchen BYSTOLIC 5 MG tablet TAKE 1 TABLET  BY MOUTH 2 TIMES DAILY. 60 tablet 6  . Calcium Carbonate-Vitamin D (CALCIUM-VITAMIN D) 600-200 MG-UNIT CAPS Take by mouth. Takes 1/2 tablet daily.    . dorzolamide-timolol (COSOPT) 22.3-6.8 MG/ML ophthalmic solution Place 1 drop into the left eye 2 (two) times daily.     . furosemide (LASIX) 40 MG tablet TAKE 1 TABLET (40 MG TOTAL) BY MOUTH DAILY. (Patient taking differently: TAKE 1 TABLET (40 MG TOTAL) BY MOUTH DAILY AS NEEDED.) 90 tablet 3  . Glucosamine-Chondroitin 1500-1200 MG/30ML LIQD Take by mouth. Takes 1/2 tablet daily.    . hydrALAZINE (APRESOLINE) 100 MG tablet Take 0.5 tablets (50 mg total) by mouth 4 (four) times daily. 120 tablet 6  . latanoprost (XALATAN) 0.005 % ophthalmic solution Place 1 drop into the left eye at bedtime.     Marland Kitchen losartan (COZAAR) 100 MG tablet TAKE 1  TABLET BY MOUTH DAILY. 30 tablet 5  . Magnesium 250 MG TABS Take 250 mg by mouth daily.    . multivitamin (THERAGRAN) per tablet Take 1 tablet by mouth daily.      Marland Kitchen omeprazole (PRILOSEC) 20 MG capsule TAKE 1 CAPSULE BY MOUTH EVERY DAY 30 capsule 2  . Polyethylene Glycol 3350 (MIRALAX PO) Take by mouth daily.     . ranitidine (ZANTAC) 75 MG tablet Take 75 mg by mouth at bedtime.    .  simvastatin (ZOCOR) 10 MG tablet TAKE 1 TABLET  BY MOUTH AT BEDTIME. 90 tablet 2  . traMADol (ULTRAM) 50 MG tablet Take 1 tablet (50 mg total) by mouth 2 (two) times daily as needed. 60 tablet 0   No current facility-administered medications for this visit.      Allergies:   Clonidine; Benicar [olmesartan]; Celebrex [celecoxib]; and Penicillins   Social History:  The patient  reports that she has never smoked. She has never used smokeless tobacco. She reports that she does not drink alcohol or use drugs.   Family History:   family history includes Diabetes in her cousin; Heart disease in her father and mother; Parkinson's disease in her mother.    Review of Systems: Review of Systems  Constitutional: Negative.   Respiratory: Negative.   Cardiovascular: Negative.   Gastrointestinal: Negative.   Musculoskeletal: Positive for back pain.  Neurological: Negative.   Psychiatric/Behavioral: Positive for depression.  All other systems reviewed and are negative.    PHYSICAL EXAM: VS:  BP (!) 162/80 (BP Location: Left Arm, Patient Position: Sitting, Cuff Size: Normal)   Pulse (!) 59   Ht 5\' 4"  (1.626 m)   Wt 131 lb (59.4 kg)   BMI 22.49 kg/m  , BMI Body mass index is 22.49 kg/m. GEN: Well nourished, well developed, in no acute distress, severe kyphosis  HEENT: normal  Neck: no JVD, carotid bruits, or masses Cardiac: RRR; no murmurs, rubs, or gallops,no edema  Respiratory:  clear to auscultation bilaterally, normal work of breathing GI: soft, nontender, nondistended, + BS MS: no deformity or atrophy  Skin: warm and dry, no rash Neuro:  Strength and sensation are intact Psych: euthymic mood, full affect    Recent Labs: 05/25/2016: ALT 13; Hemoglobin 12.7; Platelets 204.0; TSH 3.94 07/20/2016: BUN 37; Creatinine, Ser 1.47; Potassium 4.4; Sodium 134    Lipid Panel Lab Results  Component Value Date   CHOL 147 05/25/2016   HDL 70.80 05/25/2016   LDLCALC 58 05/25/2016   TRIG  94.0 05/25/2016      Wt Readings from Last 3 Encounters:  10/10/16 131 lb (59.4 kg)  07/20/16 131 lb (59.4 kg)  07/11/16 130 lb 12 oz (59.3 kg)       ASSESSMENT AND PLAN:  DIASTOLIC HEART FAILURE, CHRONIC Relatively euvolemic on today's visit Left lower extremity always bigger than the right  Essential hypertension She prefers to continue current regiment Recommended she take extra hydralazine if blood pressure runs high  Chronic kidney disease (CKD), stage III (moderate) Stable renal function, creatinine 1.4 up to 1.5  DOE (dyspnea on exertion) Recommended she start regular walking program  Adjustment disorder with other symptom Long discussion with her concerning her loss of husband Reports she is stable, good days bad days Suggested she consider living with family   Total encounter time more than 25 minutes  Greater than 50% was spent in counseling and coordination of care with the patient   Disposition:   F/U  6  months   Signed, Esmond Plants, M.D., Ph.D. 10/10/2016  [place], [place]

## 2016-10-17 ENCOUNTER — Other Ambulatory Visit: Payer: Self-pay

## 2016-10-17 MED ORDER — NEBIVOLOL HCL 5 MG PO TABS: 5.0000 mg | ORAL_TABLET | Freq: Two times a day (BID) | ORAL | 6 refills | Status: DC

## 2016-10-18 ENCOUNTER — Telehealth: Payer: Self-pay

## 2016-10-18 NOTE — Telephone Encounter (Signed)
PA denied as a benefit exclusion.

## 2016-10-18 NOTE — Telephone Encounter (Signed)
Prior Authorization for Bystolic 5 mg has been denied through covermymeds.  The patient is aware of the denial and would like to know what she needs to do now.  Please advise.

## 2016-10-18 NOTE — Telephone Encounter (Signed)
Prior Authorization for Bystolic 5 mg sent through [email or web address].  Waiting for approval for Bystolic.

## 2016-10-18 NOTE — Telephone Encounter (Signed)
PA for omeprazole completed on cover my meds.

## 2016-10-19 NOTE — Telephone Encounter (Signed)
Patient aware that Bystolic has been denied.  Patient states, she is good on the Bystolic for now and she will call if needs any samples.

## 2016-10-23 NOTE — Telephone Encounter (Signed)
Could switch bystolic to coreg [id number] mg twice a day when she runs out of samples

## 2016-10-24 ENCOUNTER — Encounter: Payer: Self-pay | Admitting: Internal Medicine

## 2016-10-24 ENCOUNTER — Ambulatory Visit (INDEPENDENT_AMBULATORY_CARE_PROVIDER_SITE_OTHER): Payer: Medicare Other | Admitting: Internal Medicine

## 2016-10-24 VITALS — BP 156/88 | HR 62 | Temp 98.4°F | Ht 64.0 in | Wt 128.4 lb

## 2016-10-24 DIAGNOSIS — N183 Chronic kidney disease, stage 3 unspecified: Secondary | ICD-10-CM

## 2016-10-24 DIAGNOSIS — C541 Malignant neoplasm of endometrium: Secondary | ICD-10-CM

## 2016-10-24 DIAGNOSIS — K2100 Gastro-esophageal reflux disease with esophagitis, without bleeding: Secondary | ICD-10-CM

## 2016-10-24 DIAGNOSIS — R441 Visual hallucinations: Secondary | ICD-10-CM | POA: Diagnosis not present

## 2016-10-24 DIAGNOSIS — E7800 Pure hypercholesterolemia, unspecified: Secondary | ICD-10-CM

## 2016-10-24 DIAGNOSIS — I159 Secondary hypertension, unspecified: Secondary | ICD-10-CM

## 2016-10-24 DIAGNOSIS — R739 Hyperglycemia, unspecified: Secondary | ICD-10-CM

## 2016-10-24 DIAGNOSIS — M545 Low back pain, unspecified: Secondary | ICD-10-CM

## 2016-10-24 DIAGNOSIS — G8929 Other chronic pain: Secondary | ICD-10-CM

## 2016-10-24 DIAGNOSIS — R609 Edema, unspecified: Secondary | ICD-10-CM

## 2016-10-24 NOTE — Progress Notes (Signed)
Pre visit review using our clinic review tool, if applicable. No additional management support is needed unless otherwise documented below in the visit note. 

## 2016-10-24 NOTE — Progress Notes (Signed)
[name], female   DOB: Nov 18, 1927, 82 y.o.   MRN: [id number]   Subjective:    [name], female    DOB: [birth year], 82 y.o.   MRN: [id number]  HPI  Patient here for a scheduled follow up.  She has CKD and is followed by Dr Candiss Norse.  Right kidney smaller than left  - right renal artery FMD/RAS.  Just evaluated 08/09/16.  Felt stable.  Recommended f/u in one year.  She also has hypertension and diastolic CHF.  Sees Dr Rockey Situ.  Just evaluated 10/10/16.  He recommended an extra hydralazine if blood pressure elevated.  She reports she is still having issues in dealing with her husband's death.  Good and bad days.  States if her children were close by, she feel she would be fine.  Has good support from them.  No chest pain.  No sob.  No acid reflux.  No abdominal pain or cramping.  Bowels stable.     Past Medical History:  Diagnosis Date  . Diastolic CHF, acute ([facility])    Echo (8/10) with EF 60-65%, mild LVH, mild to moderate TR, PASP 40 mmHg.  . Dizziness    Thought to be side effect of Norvasc  . GERD (gastroesophageal reflux disease)   . Glaucoma   . HTN (hypertension)   . Hypercalcemia    On HCTZ  . Hypercholesterolemia   . Hyperkalemia   . Macular degeneration   . Osteoporosis   . Renal fibromuscular dysplasia ([place])   . Renal insufficiency   . S/P cardiac catheterization 05/2002   Luminal irregularities only in the coronaries. EF 55%. There was some irregulatrity in the right renal artery suggestive of possible fibromuscular dysplasia. Myoview in 5/08 showed no ischemia or infarction. Lexiscan myoview at [facility] (10/10) showed EF 55%, normal wall motion, no evience ofor ischemia or infarction.  . Scoliosis associated with other condition    Chronic back pain  . Uterine cancer ([place])    S/P hypsterectomy in 2008   Past Surgical History:  Procedure Laterality Date  . CARDIAC CATHETERIZATION     Left  . CATARACT EXTRACTION Right   . REFRACTIVE SURGERY     . TOTAL ABDOMINAL HYSTERECTOMY W/ BILATERAL SALPINGOOPHORECTOMY     stage I C well differentiated adenocarcinoma   Family History  Problem Relation Age of Onset  . Heart disease Father   . Heart disease Mother   . Parkinson's disease Mother   . Diabetes Cousin    Social History   Social History  . Marital status: Widowed    Spouse name: N/A  . Number of children: 2  . Years of education: N/A   Social History Main Topics  . Smoking status: Never Smoker  . Smokeless tobacco: Never Used  . Alcohol use No  . Drug use: No  . Sexual activity: Not Asked   Other Topics Concern  . None   Social History Narrative   Lives with husband in [place]    Outpatient Encounter Prescriptions as of 10/24/2016  Medication Sig  . aspirin 81 MG EC tablet Take 81 mg by mouth daily.    . B Complex Vitamins (PA B-COMPLEX WITH B-12) TABS Take 1 tablet by mouth daily.    . brimonidine (ALPHAGAN P) 0.1 % SOLN Place 1 drop into the left eye 2 (two) times daily.  . Calcium Carbonate-Vitamin D (CALCIUM-VITAMIN D) 600-200 MG-UNIT CAPS Take by mouth. Takes 1/2 tablet daily.  . dorzolamide-timolol (COSOPT)  22.3-6.8 MG/ML ophthalmic solution Place 1 drop into the left eye 2 (two) times daily.   . furosemide (LASIX) 40 MG tablet TAKE 1 TABLET (40 MG TOTAL) BY MOUTH DAILY. (Patient taking differently: TAKE 1 TABLET (40 MG TOTAL) BY MOUTH DAILY AS NEEDED.)  . Glucosamine-Chondroitin 1500-1200 MG/30ML LIQD Take by mouth. Takes 1/2 tablet daily.  . hydrALAZINE (APRESOLINE) 100 MG tablet Take 0.5 tablets (50 mg total) by mouth 4 (four) times daily.  Marland Kitchen latanoprost (XALATAN) 0.005 % ophthalmic solution Place 1 drop into the left eye at bedtime.   Marland Kitchen losartan (COZAAR) 100 MG tablet TAKE 1 TABLET BY MOUTH DAILY.  . Magnesium 250 MG TABS Take 250 mg by mouth daily.  . multivitamin (THERAGRAN) per tablet Take 1 tablet by mouth daily.    . nebivolol (BYSTOLIC) 5 MG tablet Take 1 tablet (5 mg total) by mouth 2  (two) times daily.  Marland Kitchen omeprazole (PRILOSEC) 20 MG capsule TAKE 1 CAPSULE BY MOUTH EVERY DAY  . Polyethylene Glycol 3350 (MIRALAX PO) Take by mouth daily.   . ranitidine (ZANTAC) 75 MG tablet Take 75 mg by mouth at bedtime.  . simvastatin (ZOCOR) 10 MG tablet TAKE 1 TABLET  BY MOUTH AT BEDTIME.  . traMADol (ULTRAM) 50 MG tablet Take 1 tablet (50 mg total) by mouth 2 (two) times daily as needed.   No facility-administered encounter medications on file as of 10/24/2016.     Review of Systems  Constitutional: Negative for appetite change and fever.  HENT: Negative for congestion and sinus pressure.   Respiratory: Negative for cough, chest tightness and shortness of breath.   Cardiovascular: Negative for chest pain and palpitations.       Stable lower leg swelling.   Gastrointestinal: Negative for abdominal pain, diarrhea, nausea and vomiting.  Genitourinary: Negative for difficulty urinating and dysuria.  Musculoskeletal: Positive for back pain. Negative for joint swelling.  Skin: Negative for color change and rash.  Neurological: Negative for dizziness, light-headedness and headaches.  Psychiatric/Behavioral: Negative for agitation.       Increased stress in dealing with her husband's death.  Good and bad days.         Objective:    Physical Exam  Constitutional: She appears well-developed and well-nourished. No distress.  HENT:  Nose: Nose normal.  Mouth/Throat: Oropharynx is clear and moist.  Neck: Neck supple. No thyromegaly present.  Cardiovascular: Normal rate and regular rhythm.   Pulmonary/Chest: Breath sounds normal. No respiratory distress. She has no wheezes.  Abdominal: Soft. Bowel sounds are normal. There is no tenderness.  Musculoskeletal: She exhibits no tenderness.  Lower extremity swelling - stable.    Lymphadenopathy:    She has no cervical adenopathy.  Skin: No rash noted. No erythema.  Psychiatric: She has a normal mood and affect. Her behavior is normal.     BP (!) 156/88   Pulse 62   Temp 98.4 F (36.9 C) (Oral)   Ht 5\' 4"  (1.626 m)   Wt 128 lb 6.4 oz (58.2 kg)   SpO2 98%   BMI 22.04 kg/m  Wt Readings from Last 3 Encounters:  10/24/16 128 lb 6.4 oz (58.2 kg)  10/10/16 131 lb (59.4 kg)  07/20/16 131 lb (59.4 kg)     Lab Results  Component Value Date   WBC 9.4 05/25/2016   HGB 12.7 05/25/2016   HCT 38.5 05/25/2016   PLT 204.0 05/25/2016   GLUCOSE 117 (H) 07/20/2016   CHOL 147 05/25/2016   TRIG 94.0 05/25/2016  HDL 70.80 05/25/2016   LDLCALC 58 05/25/2016   ALT 13 05/25/2016   AST 20 05/25/2016   NA 134 (L) 07/20/2016   K 4.4 07/20/2016   CL 102 07/20/2016   CREATININE 1.47 (H) 07/20/2016   BUN 37 (H) 07/20/2016   CO2 25 07/20/2016   TSH 3.94 05/25/2016   HGBA1C 5.9 05/25/2016    Ct Lumbar Spine Wo Contrast  Result Date: 10/13/2015 CLINICAL DATA:  82 year old female with L3 vertebral fracture. Chronic lumbar back pain. Chronic scoliosis. Fall 6-8 months ago. Subsequent encounter. EXAM: CT LUMBAR SPINE WITHOUT CONTRAST TECHNIQUE: Multidetector CT imaging of the lumbar spine was performed without intravenous contrast administration. Multiplanar CT image reconstructions were also generated. COMPARISON:  Lumbar MRI 07/30/2015. CT Abdomen and Pelvis 09/02/2010. FINDINGS: Osteopenia. Moderate to severe dextro convex lumbar scoliosis, not significantly changed since 2011. Right lateral listhesis of L3 on L4 measuring up to 9 mm. L3 inferior endplate compression fracture re- demonstrated with sclerosis and mild cortical deformity (series 8, image 31 and series 9, image 34). Less than 10% loss of vertebral body height. No significant retropulsion of bone. L3 pedicles and posterior elements appear intact. No other acute or subacute osseous abnormality identified. Visible sacrum and SI joints appear intact. Widespread vacuum disc phenomena. Widespread lumbar endplate spurring. Widespread lumbar facet hypertrophy. Mild multifactorial  spinal stenosis at the L3-L4 level (series 7, image 75) is stable. Chronic large hiatal hernia, renal cysts, calcified aortic atherosclerosis. Paraspinal muscle atrophy. IMPRESSION: 1. Subacute L3 inferior endplate compression fracture re- demonstrated with less than 10% loss of vertebral body height, and no significant retropulsion of bone or complicating features. 2. Underlying osteopenia and chronic moderate to severe lumbar scoliosis with widespread disc, endplate, and facet degeneration. Stable mild multifactorial spinal stenosis at L3-L4. 3. Chronic large hiatal hernia, calcified aortic atherosclerosis. Electronically Signed   By: Genevie Ann M.D.   On: 10/13/2015 16:02       Assessment & Plan:   Problem List Items Addressed This Visit    Chronic back pain    Taking tramodol.  Chronic back pain.        Chronic kidney disease (CKD), stage III (moderate)    Followed by nephrology.        EDEMA    Stable.        Endometrial cancer [facility])    Was seeing Dr Ouida Sills.  Desires no further w/up.        GERD (gastroesophageal reflux disease)    On omeprazole.  Doing well.  Follow.       Hypercholesterolemia    On simvastatin.  Follow lipid panel and liver function tests.        Relevant Orders   Hepatic function panel   Lipid panel   Hypertension    Blood pressure still varying.  Seeing nephrology and cardiology.  Instructed to take an additional hydralazine if blood pressure elevated.  Follow.  Follow metabolic panel.        Relevant Orders   Basic metabolic panel   Visual hallucinations    Still occurs.  Not afraid.  Desires no further w/up or evaluation.         Other Visit Diagnoses    Hyperglycemia    -  Primary   Relevant Orders   Hemoglobin A1c       Einar Pheasant, MD

## 2016-10-30 ENCOUNTER — Encounter: Payer: Self-pay | Admitting: Internal Medicine

## 2016-10-30 NOTE — Assessment & Plan Note (Signed)
Followed by nephrology. 

## 2016-10-30 NOTE — Assessment & Plan Note (Signed)
Stable

## 2016-10-30 NOTE — Assessment & Plan Note (Signed)
Still occurs.  Not afraid.  Desires no further w/up or evaluation.

## 2016-10-30 NOTE — Assessment & Plan Note (Signed)
On omeprazole.  Doing well.  Follow.

## 2016-10-30 NOTE — Assessment & Plan Note (Signed)
Taking tramodol.  Chronic back pain.

## 2016-10-30 NOTE — Assessment & Plan Note (Signed)
Blood pressure still varying.  Seeing nephrology and cardiology.  Instructed to take an additional hydralazine if blood pressure elevated.  Follow.  Follow metabolic panel.

## 2016-10-30 NOTE — Assessment & Plan Note (Signed)
Was seeing Dr Ouida Sills.  Desires no further w/up.

## 2016-10-30 NOTE — Assessment & Plan Note (Signed)
On simvastatin.  Follow lipid panel and liver function tests.   

## 2016-11-07 ENCOUNTER — Telehealth: Payer: Self-pay | Admitting: Cardiovascular Disease

## 2016-11-07 NOTE — Telephone Encounter (Signed)
Spoke w/ pt.  She reports that she is upset today, no specific reason why, other than living alone.   Pt is very tearful and asks if she should call 911.  She reports that her face feels flushed and her BP is 302/94. Pt took hydralazine 50 mg at 4:00. Advised her that that med has not had time to work yet, for her to sit down and try to relax.  She states that she is nervous that she will die alone and her door is locked, so no one will be able to get in to find her. Pt started talking about her late husband, her children and her living situation, after some time, she is much calmer.  On recheck @ 4:28, BP is 186/88 and she appreciative of the distraction.  Pt will try to reach out more to her church family and see if they can visit more often and bring her meals as needed. She is also agreeable to calling her children more often and will ask them if they can visit longer at Christmas. She is considering moving to [place] to be w/ her daughter, as she is not from [place] originally. Asked pt to call back if she feels this way again and we can talk more. She is appreciative and will call back if she feels panicked again.

## 2016-11-07 NOTE — Telephone Encounter (Signed)
Pt c/o BP issue: STAT if pt c/o blurred vision, one-sided weakness or slurred speech  1. What are your last 5 BP readings? 185/94 4 pm today  Rechecked while on the phone 203/94.   2. Are you having any other symptoms (ex. Dizziness, headache, blurred vision, passed out)? Flushed in face might get a headache   3. What is your BP issue? Nervous about this lives alone not sure if she should call 911  Added to next available with ryan on 12/13 at 2pm

## 2016-11-09 ENCOUNTER — Telehealth: Payer: Self-pay | Admitting: Physician Assistant

## 2016-11-09 ENCOUNTER — Encounter: Payer: Self-pay | Admitting: Physician Assistant

## 2016-11-09 ENCOUNTER — Ambulatory Visit (INDEPENDENT_AMBULATORY_CARE_PROVIDER_SITE_OTHER): Payer: Medicare Other | Admitting: Physician Assistant

## 2016-11-09 VITALS — BP 169/84 | HR 59 | Ht 64.0 in | Wt 132.8 lb

## 2016-11-09 DIAGNOSIS — I10 Essential (primary) hypertension: Secondary | ICD-10-CM

## 2016-11-09 DIAGNOSIS — F4321 Adjustment disorder with depressed mood: Secondary | ICD-10-CM

## 2016-11-09 DIAGNOSIS — N183 Chronic kidney disease, stage 3 unspecified: Secondary | ICD-10-CM

## 2016-11-09 DIAGNOSIS — I5032 Chronic diastolic (congestive) heart failure: Secondary | ICD-10-CM

## 2016-11-09 DIAGNOSIS — E782 Mixed hyperlipidemia: Secondary | ICD-10-CM

## 2016-11-09 DIAGNOSIS — I110 Hypertensive heart disease with heart failure: Secondary | ICD-10-CM

## 2016-11-09 MED ORDER — FUROSEMIDE 40 MG PO TABS: 40.0000 mg | ORAL_TABLET | Freq: Every day | ORAL | 6 refills | Status: DC

## 2016-11-09 MED ORDER — HYDRALAZINE HCL 100 MG PO TABS: 100.0000 mg | ORAL_TABLET | Freq: Three times a day (TID) | ORAL | 6 refills | Status: DC

## 2016-11-09 MED ORDER — HYDRALAZINE HCL 100 MG PO TABS
100.0000 mg | ORAL_TABLET | Freq: Four times a day (QID) | ORAL | 6 refills | Status: DC
Start: 2016-11-09 — End: 2016-11-09

## 2016-11-09 NOTE — Telephone Encounter (Signed)
Left detailed voicemail message per release form that Christell Faith PA wanted her to take the medication hydralazine 3 times a day instead of 4 times a day and that I also notified her pharmacy of this change. Also to call back if any questions.

## 2016-11-09 NOTE — Patient Instructions (Signed)
Medication Instructions:  Your physician has recommended you make the following change in your medication:  1. INCREASE Hydralazine to 100 mg 4 times a day. 2. START Lasix 40 mg once daily  Follow-Up: Your physician recommends that you schedule a follow-up appointment in: 2-3 months with Dr. Rockey Situ.  It was a pleasure seeing you today here in the office. Please do not hesitate to give [place] a call back if you have any further questions. [place], BSN

## 2016-11-09 NOTE — Progress Notes (Signed)
Cardiology Office Note Date:  11/09/2016  Patient ID:  Jill, Castro 1927/04/24, MRN [id number] PCP:  Einar Pheasant, MD  Cardiologist:  Dr. Rockey Situ, MD   Chief Complaint: Elevated BP  History of Present Illness: Jill Castro is a 82 y.o. female with history of nonobstructive CAD by cardiac cath in 123456, chronic diastolic CHF/pulmonary hypertension, CKD stage III, hypertensive heart disease, and HLD who presents for evaluation of elevated BP in the setting of increased stress.   Negative stress test in 06/2009. Echo in 06/2009 showed normal LV systolic function with EF of 60%, mild to moderate TR, mildly elevated PASP c/w pulmonary hypertension. At her last follow up she was having significant issues/stress with the loss of her husband with her children living in outside states. She feels alone in [place] and is considering moving closer to her kids either in [place] or [place]. BP was noted at that time to be running in the 99991111 systolic at home. She was taking hydralazine 50 mg qid, losartan 100 mg daily, Lasix 40 mg daily, and Bystolic 5 mg bid. She called the office on 12/11 with complaints of her BP peaking at 203/94. After taking hydralazine 50 mg BP improved to 186/88. She was very anxious and uner increased stress as she does not like living alone. She started talking about missing her husband and wanting to be near her kids.  She presents tearful at times today, missing her husband who passed away from pancreatic cancer in July 2017. She feels like she is alone in her house. Her children have offered to come to [place] to live with her and offered her to come live with them, though she has declined. She feels like her recent BP elevations have been related to stress and anxiety. Her BP log shows BP readings mostly in the [id number] to 0000000 systolic range, with readings over the past 24 hours in the 0000000 to 123XX123 systolic range with a peak of 123456 systolic. She has been taking medications as directed.  No chest pain or headache.    Past Medical History:  Diagnosis Date  . Diastolic CHF, acute (HCC)    Echo (8/10) with EF 60-65%, mild LVH, mild to moderate TR, PASP 40 mmHg.  . Dizziness    Thought to be side effect of Norvasc  . GERD (gastroesophageal reflux disease)   . Glaucoma   . HTN (hypertension)   . Hypercalcemia    On HCTZ  . Hypercholesterolemia   . Hyperkalemia   . Macular degeneration   . Osteoporosis   . Renal fibromuscular dysplasia ([place])   . Renal insufficiency   . S/P cardiac catheterization 05/2002   Luminal irregularities only in the coronaries. EF 55%. There was some irregulatrity in the right renal artery suggestive of possible fibromuscular dysplasia. Myoview in 5/08 showed no ischemia or infarction. Lexiscan myoview at [facility] (10/10) showed EF 55%, normal wall motion, no evience ofor ischemia or infarction.  . Scoliosis associated with other condition    Chronic back pain  . Uterine cancer ([place])    S/P hypsterectomy in 2008    Past Surgical History:  Procedure Laterality Date  . CARDIAC CATHETERIZATION     Left  . CATARACT EXTRACTION Right   . REFRACTIVE SURGERY    . 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 aspirin 81 MG EC tablet Take 81  mg by mouth daily.      . B Complex Vitamins (PA B-COMPLEX WITH B-12) TABS Take 1 tablet by mouth daily.      . brimonidine (ALPHAGAN P) 0.1 % SOLN Place 1 drop into the left eye 2 (two) times daily.    . Calcium Carbonate-Vitamin D (CALCIUM-VITAMIN D) 600-200 MG-UNIT CAPS Take by mouth. Takes 1/2 tablet daily.    . dorzolamide-timolol (COSOPT) 22.3-6.8 MG/ML ophthalmic solution Place 1 drop into the left eye 2 (two) times daily.     . furosemide (LASIX) 40 MG tablet TAKE 1 TABLET (40 MG TOTAL) BY MOUTH DAILY. (Patient taking differently: TAKE 1 TABLET (40 MG TOTAL) BY MOUTH  DAILY AS NEEDED.) 90 tablet 3  . Glucosamine-Chondroitin 1500-1200 MG/30ML LIQD Take by mouth. Takes 1/2 tablet daily.    . hydrALAZINE (APRESOLINE) 100 MG tablet Take 0.5 tablets (50 mg total) by mouth 4 (four) times daily. 120 tablet 6  . latanoprost (XALATAN) 0.005 % ophthalmic solution Place 1 drop into the left eye at bedtime.     Marland Kitchen losartan (COZAAR) 100 MG tablet TAKE 1 TABLET BY MOUTH DAILY. 30 tablet 5  . Magnesium 250 MG TABS Take 250 mg by mouth daily.    . multivitamin (THERAGRAN) per tablet Take 1 tablet by mouth daily.      . nebivolol (BYSTOLIC) 5 MG tablet Take 1 tablet (5 mg total) by mouth 2 (two) times daily. 60 tablet 6  . omeprazole (PRILOSEC) 20 MG capsule TAKE 1 CAPSULE BY MOUTH EVERY DAY 30 capsule 2  . Polyethylene Glycol 3350 (MIRALAX PO) Take by mouth daily.     . ranitidine (ZANTAC) 75 MG tablet Take 75 mg by mouth at bedtime.    . simvastatin (ZOCOR) 10 MG tablet TAKE 1 TABLET  BY MOUTH AT BEDTIME. 90 tablet 2  . traMADol (ULTRAM) 50 MG tablet Take 1 tablet (50 mg total) by mouth 2 (two) times daily as needed. 60 tablet 0   No current facility-administered medications for this visit.     Allergies:   Clonidine; Benicar [olmesartan]; Celebrex [celecoxib]; and Penicillins   Social History:  The patient  reports that she has never smoked. She has never used smokeless tobacco. She reports that she does not drink alcohol or use drugs.   Family History:  The patient's family history includes Diabetes in her cousin; Heart disease in her father and mother; Parkinson's disease in her mother.  ROS:   Review of Systems  Constitutional: Positive for malaise/fatigue. Negative for chills, diaphoresis, fever and weight loss.  HENT: Negative for congestion.   Eyes: Negative for discharge and redness.  Respiratory: Negative for cough, hemoptysis, sputum production, shortness of breath and wheezing.   Cardiovascular: Negative for chest pain, palpitations, orthopnea,  claudication, leg swelling and PND.  Gastrointestinal: Negative for abdominal pain, blood in stool, heartburn, melena, nausea and vomiting.  Genitourinary: Negative for hematuria.  Musculoskeletal: Negative for falls and myalgias.  Skin: Negative for rash.  Neurological: Negative for dizziness, tingling, tremors, sensory change, speech change, focal weakness, loss of consciousness, weakness and headaches.  Endo/Heme/Allergies: Does not bruise/bleed easily.  Psychiatric/Behavioral: Positive for depression. Negative for hallucinations, memory loss, substance abuse and suicidal ideas. The patient is nervous/anxious and has insomnia.   All other systems reviewed and are negative.    PHYSICAL EXAM:  VS:  BP (!) 169/84 (BP Location: Left Arm, Patient Position: Sitting, Cuff Size: Normal)   Pulse (!) 59   Ht 5\' 4"  (1.626 m)  Wt 132 lb 12 oz (60.2 kg)   BMI 22.79 kg/m  BMI: Body mass index is 22.79 kg/m.  Physical Exam  Constitutional: She is oriented to person, place, and time. She appears well-developed and well-nourished.  Tearful  HENT:  Head: Normocephalic and atraumatic.  Eyes: Right eye exhibits no discharge. Left eye exhibits no discharge.  Neck: Normal range of motion. No JVD present.  Cardiovascular: Normal rate, regular rhythm, S1 normal, S2 normal and normal heart sounds.  Exam reveals no distant heart sounds, no friction rub, no midsystolic click and no opening snap.   No murmur heard. Pulmonary/Chest: Effort normal and breath sounds normal. No respiratory distress. She has no decreased breath sounds. She has no wheezes. She has no rales. She exhibits no tenderness.  Abdominal: Soft. She exhibits no distension. There is no tenderness.  Musculoskeletal: She exhibits edema.  Trace pretibial edema bilateral ankles, L>R.  Neurological: She is alert and oriented to person, place, and time.  Skin: Skin is warm and dry. No cyanosis. Nails show no clubbing.  Psychiatric: She has a  normal mood and affect. Her speech is normal and behavior is normal. Judgment and thought content normal.     EKG:  Was ordered and interpreted by me today. Shows sinus bradycardia, 59 bpm, LVH, nonspecific st/t changes  Recent Labs: 05/25/2016: ALT 13; Hemoglobin 12.7; Platelets 204.0; TSH 3.94 07/20/2016: BUN 37; Creatinine, Ser 1.47; Potassium 4.4; Sodium 134  05/25/2016: Cholesterol 147; HDL 70.80; LDL Cholesterol 58; Total CHOL/HDL Ratio 2; Triglycerides 94.0; VLDL 18.8   CrCl cannot be calculated (Patient's most recent lab result is older than the maximum 21 days allowed.).   Wt Readings from Last 3 Encounters:  11/09/16 132 lb 12 oz (60.2 kg)  10/24/16 128 lb 6.4 oz (58.2 kg)  10/10/16 131 lb (59.4 kg)     Other studies reviewed: Additional studies/records reviewed today include: summarized above  ASSESSMENT AND PLAN:  1. Accelerated hypertension: Discussed treatment options in detail. She would like to increase hydralazine to 100 mg tid. Refill Lasix 40 mg daily. Continue Bystolic 5 mg bid. Heart rate precludes further titration of Bystolic. Continue losartan 100 mg daily. Possible adjustment disorder/anxiety/possible panic attacks playing a role in her elevated readings. Suggest she follow up with PCP to gain better control of her underlying psychiatric illness in addition to titration of antihypertensives as above.   2. Chronic diastolic CHF: She does not appear to be volume overloaded at this time. Refill Lasix 40 mg daily (has been taking husband's Lasix 20 mg daily for the past week). Continue Bystolic 5 mg bid. CHF education. Compression hose.   3. Hypertensive heart disease: As above.   4. HLD: Simvastatin. If amlodipine is needed to help control her BP would change statin to Lipitor.   5. Adjustment disorder: As above.   6. CKD stage III: Most recent SCr from 06/2016 showed stable renal function. Followed by PCP.   Disposition: F/u with Dr. Rockey Situ in 2-3 months.    Current medicines are reviewed at length with the patient today.  The patient did not have any concerns regarding medicines.  Melvern Banker PA-C 11/09/2016 2:38 PM     [address], [place] [address] [phone]

## 2016-11-09 NOTE — Telephone Encounter (Signed)
Left voicemail message to call back to discuss medication changes.

## 2016-11-10 NOTE — Telephone Encounter (Signed)
Spoke with patient and instructed her to take the hydralazine 100 mg three times a day per recommendations by Christell Faith PA. She verbalized understanding of instructions and let her know that I contacted pharmacy to update her prescription as well. She was appreciative for the call and had no further questions at this time.

## 2016-11-12 ENCOUNTER — Telehealth: Payer: Self-pay | Admitting: Physician Assistant

## 2016-11-12 ENCOUNTER — Other Ambulatory Visit: Payer: Self-pay | Admitting: Physician Assistant

## 2016-11-12 MED ORDER — AMLODIPINE BESYLATE 5 MG PO TABS: 5.0000 mg | ORAL_TABLET | Freq: Every day | ORAL | 3 refills | Status: DC

## 2016-11-12 NOTE — Telephone Encounter (Signed)
    Patient calling about high BPs. Today it was 206/80 and so she is calling for further instructions. Repeat BP while on the phone is 196/91. Recently seen by Christell Faith PA-C in the office and hydralazine increased to 100mg  TID. She was continued on bystolic 5mg  BID and Losartan 100 mg. On max doses of Losartan and hydralazine. Cannot increase bystolic given baseline HR in 50s. Will start amlodipine 5mg  daily (sent to her local pharmacy). She will continue to monitor BPs and call office Monday if not under better control.  Angelena Form PA-C  [facility]

## 2016-11-14 ENCOUNTER — Telehealth: Payer: Self-pay | Admitting: Cardiovascular Disease

## 2016-11-14 NOTE — Telephone Encounter (Signed)
Please advise pt has question about medications.

## 2016-11-14 NOTE — Telephone Encounter (Signed)
Pt calling stating she would like someone to give her a call back  She states she received some new medication from [place] and she has no clue when or how to take it Please call back

## 2016-11-14 NOTE — Telephone Encounter (Signed)
Spoke with patient and she just wanted to confirm her medications and dosage amounts. Reviewed her medications dosage and times to take. She verbalized understanding and instructed her to continue monitoring her blood pressures. She was appreciative for the call and had no further questions at this time.

## 2016-11-15 ENCOUNTER — Telehealth: Payer: Self-pay | Admitting: Cardiovascular Disease

## 2016-11-15 NOTE — Telephone Encounter (Signed)
Pt calling asking if we can send in generic version for bystolic  Please sent to [facility]

## 2016-11-15 NOTE — Telephone Encounter (Signed)
Spoke with patient and let her know that Bystolic is not yet generic. She was appreciative for the call and had no further questions.

## 2016-11-15 NOTE — Telephone Encounter (Signed)
Please advise pt requesting Generic version of Bystolic.

## 2016-11-15 NOTE — Telephone Encounter (Signed)
Called [facility] pharmacy and there is no generic available for this medication. Will call and notify patient.

## 2016-12-16 ENCOUNTER — Telehealth: Payer: Self-pay | Admitting: Cardiovascular Disease

## 2016-12-16 ENCOUNTER — Ambulatory Visit: Payer: Medicare Other | Admitting: Cardiovascular Disease

## 2016-12-16 NOTE — Telephone Encounter (Signed)
Pt states her Bystolic has increased to over $100. She did not have it for 1 day, and her BP was fine. She asks if she needs to take this medicaton. Please call and advise.

## 2016-12-16 NOTE — Telephone Encounter (Signed)
Pt's ins will not cover Bystolic and she cannot afford it.  She would like a generic alternative, is possible.

## 2016-12-18 NOTE — Telephone Encounter (Signed)
Ok to hold bystolic But would increase the hydralazine up to 100 mg Monitor BP and call our office with numbers

## 2016-12-19 NOTE — Telephone Encounter (Signed)
Left message for pt to call back  °

## 2016-12-19 NOTE — Telephone Encounter (Signed)
Spoke w/ pt.  Advised her of Dr. Donivan Scull recommendation.  She reports that she is already taking hydralazine 100 mg TID. She will monitor her BP after stopping Bystolic, call his readings increase and keep her appt next Monday.

## 2016-12-26 ENCOUNTER — Ambulatory Visit (INDEPENDENT_AMBULATORY_CARE_PROVIDER_SITE_OTHER): Payer: Medicare Other | Admitting: Cardiovascular Disease

## 2016-12-26 ENCOUNTER — Encounter: Payer: Self-pay | Admitting: Cardiovascular Disease

## 2016-12-26 VITALS — BP 188/90 | HR 84 | Ht 64.0 in | Wt 134.5 lb

## 2016-12-26 DIAGNOSIS — I5032 Chronic diastolic (congestive) heart failure: Secondary | ICD-10-CM

## 2016-12-26 DIAGNOSIS — E7800 Pure hypercholesterolemia, unspecified: Secondary | ICD-10-CM | POA: Diagnosis not present

## 2016-12-26 DIAGNOSIS — N183 Chronic kidney disease, stage 3 unspecified: Secondary | ICD-10-CM

## 2016-12-26 DIAGNOSIS — R441 Visual hallucinations: Secondary | ICD-10-CM

## 2016-12-26 DIAGNOSIS — R609 Edema, unspecified: Secondary | ICD-10-CM

## 2016-12-26 DIAGNOSIS — I10 Essential (primary) hypertension: Secondary | ICD-10-CM | POA: Diagnosis not present

## 2016-12-26 MED ORDER — AMLODIPINE BESYLATE 5 MG PO TABS: 5.0000 mg | ORAL_TABLET | Freq: Two times a day (BID) | ORAL | 3 refills | Status: DC | PRN

## 2016-12-26 MED ORDER — HYDRALAZINE HCL 100 MG PO TABS: 100.0000 mg | ORAL_TABLET | Freq: Four times a day (QID) | ORAL | 3 refills | Status: DC

## 2016-12-26 NOTE — Patient Instructions (Addendum)
Medication Instructions:   For high blood pressure that does not come down: Take an extra hydralazine Or take extra amlodipine   Labwork:  No new labs needed  Testing/Procedures:  No further testing at this time   I recommend watching educational videos on topics of interest to you at:       www.goemmi.com  Enter code: HEARTCARE    Follow-Up: It was a pleasure seeing you in the office today. Please call us if you have new issues that need to be addressed before your next appt.  [phone]  Your physician wants you to follow-up in: 3 months.  You will receive a reminder letter in the mail two months in advance. If you don't receive a letter, please call our office to schedule the follow-up appointment.  If you need a refill on your cardiac medications before your next appointment, please call your pharmacy.

## 2016-12-26 NOTE — Progress Notes (Signed)
Cardiology Office Note  Date:  12/26/2016   ID:  FANITA MYKLEBUST, DOB 24-Aug-1927, MRN [id number]  PCP:  Einar Pheasant, MD   Chief Complaint  Patient presents with  . other    2-3 month follow up c/o elevated blood pressure and chest pain. Pt mentioned having "visions" since diagnosed with Glaucoma. Meds reviewed verbally with patient.     HPI:  Ms. Liverman is an 83 yo pleasant woman with history of HTN and diastolic CHF, chronic lower extremity edema (left leg in particular), presents for followup of her hypertension and diastolic CHF  She has severe scoliosis and chronic back pain and requires pain medication.  Husband passed Recently from pancreatic cancer  In follow-up she reports that she continues having hallucinations, Depressed, Thinking of moving into a nursing facility, [facility] Blood pressure has been running high She could not afford bystolic, this was discontinued and we recommended she increase her hydralazine up to 100 mg 3 times a day Despite this blood pressures continue to run high after 0000000 up to 99991111 systolic  Reports that she has had worsening leg swelling Reports that she is taking Lasix 3-4 times per week Wears her compression hose L> R leg swelling Slid out of bed several weeks back, swelling seem to get worse after that  Son lives in [place], daughter in [place] Good days bad days Requiring assistance from neighbors to drive and help with groceries  Continues to have chronic back pain Previously Surveyor, minerals to chiropractic, did not help back pain  Other past medical history Chronic back pain, not a candidate for back surgery Continued eye problem, macular degeneration. Baseline Creatinine 1.4 Tries to wear compression hose when she can.  catheterization in 2003 showing luminal irregularities, negative stress test in October 2010 with no ischemia, echocardiogram August 2010 showing normal systolic function, mild to moderate tricuspid  regurgitation, mildly elevated right ventricular systolic pressures consistent with mild pulmonary hypertension, ejection fraction 60%.  PMH:   has a past medical history of Diastolic CHF, acute ([place]); Dizziness; GERD (gastroesophageal reflux disease); Glaucoma; HTN (hypertension); Hypercalcemia; Hypercholesterolemia; Hyperkalemia; Macular degeneration; Osteoporosis; Renal fibromuscular dysplasia ([place]); Renal insufficiency; S/P cardiac catheterization (05/2002); Scoliosis associated with other condition; and Uterine cancer ([place]).  PSH:    Past Surgical History:  Procedure Laterality Date  . CARDIAC CATHETERIZATION     Left  . CATARACT EXTRACTION Right   . REFRACTIVE SURGERY    . 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 aspirin 81 MG EC tablet Take 81 mg by mouth daily.      . B Complex Vitamins (PA B-COMPLEX WITH B-12) TABS Take 1 tablet by mouth daily.      . brimonidine (ALPHAGAN P) 0.1 % SOLN Place 1 drop into the left eye 2 (two) times daily.    . Calcium Carbonate-Vitamin D (CALCIUM-VITAMIN D) 600-200 MG-UNIT CAPS Take by mouth. Takes 1/2 tablet daily.    . dorzolamide-timolol (COSOPT) 22.3-6.8 MG/ML ophthalmic solution Place 1 drop into the left eye 2 (two) times daily.     . furosemide (LASIX) 40 MG tablet Take 1 tablet (40 mg total) by mouth daily. 30 tablet 6  . Glucosamine-Chondroitin 1500-1200 MG/30ML LIQD Take by mouth. Takes 1/2 tablet daily.    . hydrALAZINE (APRESOLINE) 100 MG tablet Take 1 tablet (100 mg total) by mouth 4 (four) times daily. 360 tablet 3  .  latanoprost (XALATAN) 0.005 % ophthalmic solution Place 1 drop into the left eye at bedtime.     Marland Kitchen losartan (COZAAR) 100 MG tablet TAKE 1 TABLET BY MOUTH DAILY. 30 tablet 5  . Magnesium 250 MG TABS Take 250 mg by mouth daily.    . multivitamin (THERAGRAN) per tablet Take 1 tablet  by mouth daily.      Marland Kitchen omeprazole (PRILOSEC) 20 MG capsule TAKE 1 CAPSULE BY MOUTH EVERY DAY 30 capsule 2  . Polyethylene Glycol 3350 (MIRALAX PO) Take by mouth daily.     . ranitidine (ZANTAC) 75 MG tablet Take 75 mg by mouth at bedtime.    . simvastatin (ZOCOR) 10 MG tablet TAKE 1 TABLET  BY MOUTH AT BEDTIME. 90 tablet 2  . amLODipine (NORVASC) 5 MG tablet Take 1 tablet (5 mg total) by mouth 2 (two) times daily as needed. 180 tablet 3   No current facility-administered medications for this visit.      Allergies:   Clonidine; Benicar [olmesartan]; Celebrex [celecoxib]; and Penicillins   Social History:  The patient  reports that she has never smoked. She has never used smokeless tobacco. She reports that she does not drink alcohol or use drugs.   Family History:   family history includes Diabetes in her cousin; Heart disease in her father and mother; Parkinson's disease in her mother.    Review of Systems: Review of Systems  Constitutional: Negative.   Respiratory: Negative.   Cardiovascular: Negative.   Gastrointestinal: Negative.   Musculoskeletal: Negative.   Neurological: Negative.   Psychiatric/Behavioral: Negative.   All other systems reviewed and are negative.    PHYSICAL EXAM: VS:  BP (!) 188/90 (BP Location: Left Arm, Patient Position: Sitting, Cuff Size: Normal)   Pulse 84   Ht 5\' 4"  (1.626 m)   Wt 134 lb 8 oz (61 kg)   BMI 23.09 kg/m  , BMI Body mass index is 23.09 kg/m. GEN: Well nourished, well developed, in no acute distress, appears frail, using a cane  HEENT: normal  Neck: no JVD, carotid bruits, or masses Cardiac: RRR; no murmurs, rubs, or gallops, trace pitting edema bilaterally with compression hose in place  Respiratory:  clear to auscultation bilaterally, normal work of breathing GI: soft, nontender, nondistended, + BS MS: no deformity or atrophy  Skin: warm and dry, no rash Neuro:  Strength and sensation are intact Psych: euthymic mood, full  affect    Recent Labs: 05/25/2016: ALT 13; Hemoglobin 12.7; Platelets 204.0; TSH 3.94 07/20/2016: BUN 37; Creatinine, Ser 1.47; Potassium 4.4; Sodium 134    Lipid Panel Lab Results  Component Value Date   CHOL 147 05/25/2016   HDL 70.80 05/25/2016   LDLCALC 58 05/25/2016   TRIG 94.0 05/25/2016      Wt Readings from Last 3 Encounters:  12/26/16 134 lb 8 oz (61 kg)  11/09/16 132 lb 12 oz (60.2 kg)  10/24/16 128 lb 6.4 oz (58.2 kg)       ASSESSMENT AND PLAN:  Hypercholesterolemia Cholesterol is at goal on the current lipid regimen. No changes to the medications were made.  DIASTOLIC HEART FAILURE, CHRONIC Continue Lasix several days per week If swelling does get worse, could consider echocardiogram Lungs are clear, no shortness of breath on exertion.  Essential hypertension Blood pressure is well controlled on today's visit. No changes made to the medications.  Edema, unspecified type Consistent with venous insufficiency, Less likely cardiac. Recommend she continue compression hose  Chronic kidney disease (CKD), stage  III (moderate) Recommended she try not to take Lasix daily, no more than 3-4 times per week For climbing BUN and creatinine, may need to decrease Lasix  Visual hallucinations Etiology of her visual hallucinations is not clear Seem to happen after loss of her husband Long discussion concerning various treatment options.  Perhaps might improve with change of location for example if she moves into [facility]. Unable to exclude neurologic issue  Disposition:   F/U  6 months   Total encounter time more than 25 minutes  Greater than 50% was spent in counseling and coordination of care with the patient   No orders of the defined types were placed in this encounter.    Signed, Esmond Plants, M.D., Ph.D. 12/26/2016  [place], [place]

## 2017-01-02 ENCOUNTER — Telehealth: Payer: Self-pay | Admitting: Internal Medicine

## 2017-01-02 DIAGNOSIS — K5900 Constipation, unspecified: Secondary | ICD-10-CM

## 2017-01-02 DIAGNOSIS — R195 Other fecal abnormalities: Secondary | ICD-10-CM

## 2017-01-02 NOTE — Telephone Encounter (Signed)
Pt called and stated that she is having eye surgery on March 5th. She had a couple of questions that she wanted to discuss with Dr. Nicki Reaper before the surgery. Please advise, thank you!  Call pt @ [phone]

## 2017-01-03 NOTE — Telephone Encounter (Signed)
Spoke to pt she would like to get referral for GI (does not matter as long as local) she is having changes in the color of stool with constipation. Her husband passed in aug from cancer and she is having a lot of anxiety about this.

## 2017-01-04 NOTE — Telephone Encounter (Signed)
Orders placed for GI referral.  Someone will be contacting her with an appt date and time.

## 2017-01-04 NOTE — Telephone Encounter (Signed)
Pt informed

## 2017-01-05 ENCOUNTER — Other Ambulatory Visit: Payer: Self-pay | Admitting: Internal Medicine

## 2017-01-06 ENCOUNTER — Encounter: Payer: Self-pay | Admitting: Internal Medicine

## 2017-01-10 ENCOUNTER — Ambulatory Visit: Payer: Medicare Other | Admitting: Cardiovascular Disease

## 2017-01-24 ENCOUNTER — Encounter: Payer: Self-pay | Admitting: *Deleted

## 2017-01-24 ENCOUNTER — Telehealth: Payer: Self-pay | Admitting: Internal Medicine

## 2017-01-24 NOTE — Telephone Encounter (Signed)
App made for o/v this week.

## 2017-01-24 NOTE — Telephone Encounter (Signed)
Called pt she states that she has glaucoma is scheduled for surgery on 01-30-17 to remove cataract and place stent. She states that she has been having visions at night due to her glaucoma she knows they are not real but it makes it hard to go back to sleep. Started around the time that her husband passed away in 08-03-23. She would like to know if you can give her mild anxiety medication to take at night to help with sleeping.

## 2017-01-24 NOTE — Telephone Encounter (Signed)
Will need to be evaluated to discuss treatment options.

## 2017-01-24 NOTE — Telephone Encounter (Signed)
Pt called about wanting to get something to help her sleep better. Pt does not sleep well due to having visions at night when she opens her eyes. Please advise?  Pharmacy is [facility], [place]  Call pt @ [phone]. Thank you!

## 2017-01-26 ENCOUNTER — Ambulatory Visit (INDEPENDENT_AMBULATORY_CARE_PROVIDER_SITE_OTHER): Payer: Medicare Other | Admitting: Internal Medicine

## 2017-01-26 ENCOUNTER — Encounter: Payer: Self-pay | Admitting: Internal Medicine

## 2017-01-26 VITALS — BP 136/70 | HR 84 | Temp 97.6°F | Ht 64.0 in | Wt 135.0 lb

## 2017-01-26 DIAGNOSIS — I10 Essential (primary) hypertension: Secondary | ICD-10-CM

## 2017-01-26 DIAGNOSIS — F329 Major depressive disorder, single episode, unspecified: Secondary | ICD-10-CM

## 2017-01-26 DIAGNOSIS — R079 Chest pain, unspecified: Secondary | ICD-10-CM

## 2017-01-26 DIAGNOSIS — G8929 Other chronic pain: Secondary | ICD-10-CM | POA: Diagnosis not present

## 2017-01-26 DIAGNOSIS — K2100 Gastro-esophageal reflux disease with esophagitis, without bleeding: Secondary | ICD-10-CM

## 2017-01-26 DIAGNOSIS — E7800 Pure hypercholesterolemia, unspecified: Secondary | ICD-10-CM

## 2017-01-26 DIAGNOSIS — M545 Low back pain, unspecified: Secondary | ICD-10-CM

## 2017-01-26 DIAGNOSIS — F32A Depression, unspecified: Secondary | ICD-10-CM

## 2017-01-26 DIAGNOSIS — R441 Visual hallucinations: Secondary | ICD-10-CM | POA: Diagnosis not present

## 2017-01-26 MED ORDER — NYSTATIN 100000 UNIT/GM EX CREA: 1.0000 "application " | TOPICAL_CREAM | Freq: Two times a day (BID) | CUTANEOUS | 0 refills | Status: DC

## 2017-01-26 MED ORDER — SERTRALINE HCL 25 MG PO TABS: 25.0000 mg | ORAL_TABLET | Freq: Every day | ORAL | 1 refills | Status: DC

## 2017-01-26 NOTE — Progress Notes (Signed)
Pre visit review using our clinic review tool, if applicable. No additional management support is needed unless otherwise documented below in the visit note. 

## 2017-01-26 NOTE — Patient Instructions (Signed)
Increase prilosec to twice a day.   

## 2017-01-26 NOTE — Progress Notes (Signed)
[name], female   DOB: March 10, 1927, 83 y.o.   MRN: [id number]   Subjective:    [name], female    DOB: December 25, 1926, 83 y.o.   MRN: [id number]  HPI  Patient here as a work in to discuss increase stress and some depression.  She is still trying to cope with her husband's death.  Discussed with her today.  She feels she needs something to help level things out.  She reports she had an episode of chest pain one week ago.  Occurred while in bed.  No pain with walking.  Resolved on its own.  Some acid reflux.  No abdominal pain or cramping.  Bowels stable.  Some left foot swelling.  Has had issues with this for a while.  Visual hallucinations.     Past Medical History:  Diagnosis Date  . Sherran Needs syndrome   . Diastolic CHF, acute (HCC)    Echo (8/10) with EF 60-65%, mild LVH, mild to moderate TR, PASP 40 mmHg.  . Dizziness    Thought to be side effect of Norvasc  . GERD (gastroesophageal reflux disease)   . Glaucoma   . HTN (hypertension)   . Hypercalcemia    On HCTZ  . Hypercholesterolemia   . Hyperkalemia   . Macular degeneration   . Osteoporosis   . Renal fibromuscular dysplasia ([place])   . Renal insufficiency   . S/P cardiac catheterization 05/2002   Luminal irregularities only in the coronaries. EF 55%. There was some irregulatrity in the right renal artery suggestive of possible fibromuscular dysplasia. Myoview in 5/08 showed no ischemia or infarction. Lexiscan myoview at [facility] (10/10) showed EF 55%, normal wall motion, no evience ofor ischemia or infarction.  . Scoliosis associated with other condition    Chronic back pain  . Uterine cancer ([place])    S/P hypsterectomy in 2008  . Wears hearing aid    bilateral   Past Surgical History:  Procedure Laterality Date  . CARDIAC CATHETERIZATION     Left  . CATARACT EXTRACTION Right 07/03/2013   [facility] Dr Wallace Going  . REFRACTIVE SURGERY    . TOTAL ABDOMINAL HYSTERECTOMY W/ BILATERAL  SALPINGOOPHORECTOMY     stage I C well differentiated adenocarcinoma   Family History  Problem Relation Age of Onset  . Heart disease Father   . Heart disease Mother   . Parkinson's disease Mother   . Diabetes Cousin    Social History   Social History  . Marital status: Widowed    Spouse name: N/A  . Number of children: 2  . Years of education: N/A   Social History Main Topics  . Smoking status: Never Smoker  . Smokeless tobacco: Never Used  . Alcohol use No  . Drug use: No  . Sexual activity: Not Asked   Other Topics Concern  . None   Social History Narrative   Lives with husband in [place]    Outpatient Encounter Prescriptions as of 01/26/2017  Medication Sig  . amLODipine (NORVASC) 5 MG tablet Take 1 tablet (5 mg total) by mouth 2 (two) times daily as needed.  Marland Kitchen aspirin 81 MG EC tablet Take 81 mg by mouth daily.    . B Complex Vitamins (PA B-COMPLEX WITH B-12) TABS Take 1 tablet by mouth daily.    . brimonidine (ALPHAGAN P) 0.1 % SOLN Place 1 drop into the left eye 2 (two) times daily.  . Calcium Carbonate-Vitamin D (CALCIUM-VITAMIN D) 600-200 MG-UNIT  CAPS Take by mouth. Takes 1/2 tablet daily.  . dorzolamide-timolol (COSOPT) 22.3-6.8 MG/ML ophthalmic solution Place 1 drop into the left eye 2 (two) times daily.   . furosemide (LASIX) 40 MG tablet Take 1 tablet (40 mg total) by mouth daily.  . Glucosamine-Chondroitin 1500-1200 MG/30ML LIQD Take by mouth. Takes 1/2 tablet daily.  . hydrALAZINE (APRESOLINE) 100 MG tablet Take 1 tablet (100 mg total) by mouth 4 (four) times daily.  Marland Kitchen latanoprost (XALATAN) 0.005 % ophthalmic solution Place 1 drop into the left eye at bedtime.   Marland Kitchen losartan (COZAAR) 100 MG tablet TAKE 1 TABLET BY MOUTH DAILY.  . Magnesium 250 MG TABS Take 250 mg by mouth daily.  . multivitamin (THERAGRAN) per tablet Take 1 tablet by mouth daily.    Marland Kitchen omeprazole (PRILOSEC) 20 MG capsule TAKE 1 CAPSULE EVERY DAY  . Polyethylene Glycol 3350 (MIRALAX PO) Take  by mouth daily.   . ranitidine (ZANTAC) 75 MG tablet Take 75 mg by mouth at bedtime.  . simvastatin (ZOCOR) 10 MG tablet TAKE 1 TABLET  BY MOUTH AT BEDTIME.  Marland Kitchen nystatin cream (MYCOSTATIN) Apply 1 application topically 2 (two) times daily.  . sertraline (ZOLOFT) 25 MG tablet Take 1 tablet (25 mg total) by mouth daily.   No facility-administered encounter medications on file as of 01/26/2017.     Review of Systems  Constitutional: Negative for appetite change and unexpected weight change.  HENT: Negative for congestion and sinus pressure.   Respiratory: Negative for cough and shortness of breath.   Cardiovascular: Positive for chest pain. Negative for palpitations.       Left foot swelling.    Gastrointestinal: Negative for abdominal pain, diarrhea, nausea and vomiting.  Genitourinary: Negative for difficulty urinating and dysuria.  Musculoskeletal: Positive for back pain. Negative for joint swelling.  Skin: Negative for color change and rash.  Neurological: Negative for dizziness, light-headedness and headaches.  Psychiatric/Behavioral: Positive for hallucinations. Negative for agitation.       Objective:    Physical Exam  Constitutional: She appears well-developed and well-nourished. No distress.  HENT:  Nose: Nose normal.  Mouth/Throat: Oropharynx is clear and moist.  Neck: Neck supple. No thyromegaly present.  Cardiovascular: Normal rate and regular rhythm.   Pulmonary/Chest: Breath sounds normal. No respiratory distress. She has no wheezes.  Abdominal: Soft. Bowel sounds are normal. There is no tenderness.  Musculoskeletal: She exhibits no tenderness.  Left foot swelling.    Lymphadenopathy:    She has no cervical adenopathy.  Skin: No rash noted. No erythema.  Psychiatric: She has a normal mood and affect. Her behavior is normal.    BP 136/70 (BP Location: Left Arm, Patient Position: Sitting, Cuff Size: Normal)   Pulse 84   Temp 97.6 F (36.4 C) (Oral)   Ht 5\' 4"   (1.626 m)   Wt 135 lb (61.2 kg)   SpO2 96%   BMI 23.17 kg/m  Wt Readings from Last 3 Encounters:  01/26/17 135 lb (61.2 kg)  12/26/16 134 lb 8 oz (61 kg)  11/09/16 132 lb 12 oz (60.2 kg)     Lab Results  Component Value Date   WBC 9.4 05/25/2016   HGB 12.7 05/25/2016   HCT 38.5 05/25/2016   PLT 204.0 05/25/2016   GLUCOSE 117 (H) 07/20/2016   CHOL 147 05/25/2016   TRIG 94.0 05/25/2016   HDL 70.80 05/25/2016   LDLCALC 58 05/25/2016   ALT 13 05/25/2016   AST 20 05/25/2016   NA 134 (L)  07/20/2016   K 4.4 07/20/2016   CL 102 07/20/2016   CREATININE 1.47 (H) 07/20/2016   BUN 37 (H) 07/20/2016   CO2 25 07/20/2016   TSH 3.94 05/25/2016   HGBA1C 5.9 05/25/2016    Ct Lumbar Spine Wo Contrast  Result Date: 10/13/2015 CLINICAL DATA:  83 year old female with L3 vertebral fracture. Chronic lumbar back pain. Chronic scoliosis. Fall 6-8 months ago. Subsequent encounter. EXAM: CT LUMBAR SPINE WITHOUT CONTRAST TECHNIQUE: Multidetector CT imaging of the lumbar spine was performed without intravenous contrast administration. Multiplanar CT image reconstructions were also generated. COMPARISON:  Lumbar MRI 07/30/2015. CT Abdomen and Pelvis 09/02/2010. FINDINGS: Osteopenia. Moderate to severe dextro convex lumbar scoliosis, not significantly changed since 2011. Right lateral listhesis of L3 on L4 measuring up to 9 mm. L3 inferior endplate compression fracture re- demonstrated with sclerosis and mild cortical deformity (series 8, image 31 and series 9, image 34). Less than 10% loss of vertebral body height. No significant retropulsion of bone. L3 pedicles and posterior elements appear intact. No other acute or subacute osseous abnormality identified. Visible sacrum and SI joints appear intact. Widespread vacuum disc phenomena. Widespread lumbar endplate spurring. Widespread lumbar facet hypertrophy. Mild multifactorial spinal stenosis at the L3-L4 level (series 7, image 75) is stable. Chronic large  hiatal hernia, renal cysts, calcified aortic atherosclerosis. Paraspinal muscle atrophy. IMPRESSION: 1. Subacute L3 inferior endplate compression fracture re- demonstrated with less than 10% loss of vertebral body height, and no significant retropulsion of bone or complicating features. 2. Underlying osteopenia and chronic moderate to severe lumbar scoliosis with widespread disc, endplate, and facet degeneration. Stable mild multifactorial spinal stenosis at L3-L4. 3. Chronic large hiatal hernia, calcified aortic atherosclerosis. Electronically Signed   By: Genevie Ann M.D.   On: 10/13/2015 16:02       Assessment & Plan:   Problem List Items Addressed This Visit    Chest pain - Primary    Chest pain as outlined.  Only occurred at night.  No pain since.  Increased prilosec to bid.  EKG - SR no acute ischemic changes.  Discussed with cardiology.  No further w/up felt warranted.  Planning for cataract surgery. Will need close intra op and post op monitoring of her heart rate and blood pressure to avoid extremes.        Relevant Orders   EKG 12-Lead (Completed)   Depression    Discussed with her today.  Will start zoloft.  Follow.        Relevant Medications   sertraline (ZOLOFT) 25 MG tablet   GERD (gastroesophageal reflux disease)    Increase omeprazole to bid.  Follow.        Hypercholesterolemia    Low cholesterol diet and exercise.  On simvastatin.  Follow lipid panel and liver function tests.        Hypertension    Blood pressure varying.  Overall ok.  Same medication regimen.  Follow pressures.  Follow metabolic panel.        Visual hallucinations    Still occurring.  Has desired no further w/up or evaluation.            Einar Pheasant, MD

## 2017-01-27 NOTE — Discharge Instructions (Signed)
Cataract Surgery, Care After °Refer to this sheet in the next few weeks. These instructions provide you with information about caring for yourself after your procedure. Your health care provider may also give you more specific instructions. Your treatment has been planned according to current medical practices, but problems sometimes occur. Call your health care provider if you have any problems or questions after your procedure. °What can I expect after the procedure? °After the procedure, it is common to have: °· Itching. °· Discomfort. °· Fluid discharge. °· Sensitivity to light and to touch. °· Bruising. °Follow these instructions at home: °Eye Care  °· Check your eye every day for signs of infection. Watch for: °¨ Redness, swelling, or pain. °¨ Fluid, blood, or pus. °¨ Warmth. °¨ Bad smell. °Activity  °· Avoid strenuous activities, such as playing contact sports, for as long as told by your health care provider. °· Do not drive or operate heavy machinery until your health care provider approves. °· Do not bend or lift heavy objects . Bending increases pressure in the eye. You can walk, climb stairs, and do light household chores. °· Ask your health care provider when you can return to work. If you work in a dusty environment, you may be advised to wear protective eyewear for a period of time. °General instructions  °· Take or apply over-the-counter and prescription medicines only as told by your health care provider. This includes eye drops. °· Do not touch or rub your eyes. °· If you were given a protective shield, wear it as told by your health care provider. If you were not given a protective shield, wear sunglasses as told by your health care provider to protect your eyes. °· Keep the area around your eye clean and dry. Avoid swimming or allowing water to hit you directly in the face while showering until told by your health care provider. Keep soap and shampoo out of your eyes. °· Do not put a contact lens  into the affected eye or eyes until your health care provider approves. °· Keep all follow-up visits as told by your health care provider. This is important. °Contact a health care provider if: ° °· You have increased bruising around your eye. °· You have pain that is not helped with medicine. °· You have a fever. °· You have redness, swelling, or pain in your eye. °· You have fluid, blood, or pus coming from your incision. °· Your vision gets worse. °Get help right away if: °· You have sudden vision loss. °This information is not intended to replace advice given to you by your health care provider. Make sure you discuss any questions you have with your health care provider. °Document Released: 06/03/2005 Document Revised: 03/24/2016 Document Reviewed: 09/24/2015 °Elsevier Interactive Patient Education © 2017 [employer] ° ° ° ° °General Anesthesia, Adult, Care After °These instructions provide you with information about caring for yourself after your procedure. Your health care provider may also give you more specific instructions. Your treatment has been planned according to current medical practices, but problems sometimes occur. Call your health care provider if you have any problems or questions after your procedure. °What can I expect after the procedure? °After the procedure, it is common to have: °· Vomiting. °· A sore throat. °· Mental slowness. °It is common to feel: °· Nauseous. °· Cold or shivery. °· Sleepy. °· Tired. °· Sore or achy, even in parts of your body where you did not have surgery. °Follow these instructions at   home: °For at least 24 hours after the procedure:  °· Do not: °¨ Participate in activities where you could fall or become injured. °¨ Drive. °¨ Use heavy machinery. °¨ Drink alcohol. °¨ Take sleeping pills or medicines that cause drowsiness. °¨ Make important decisions or sign legal documents. °¨ Take care of children on your own. °· Rest. °Eating and drinking  °· If you vomit, drink  water, juice, or soup when you can drink without vomiting. °· Drink enough fluid to keep your urine clear or pale yellow. °· Make sure you have little or no nausea before eating solid foods. °· Follow the diet recommended by your health care provider. °General instructions  °· Have a responsible adult stay with you until you are awake and alert. °· Return to your normal activities as told by your health care provider. Ask your health care provider what activities are safe for you. °· Take over-the-counter and prescription medicines only as told by your health care provider. °· If you smoke, do not smoke without supervision. °· Keep all follow-up visits as told by your health care provider. This is important. °Contact a health care provider if: °· You continue to have nausea or vomiting at home, and medicines are not helpful. °· You cannot drink fluids or start eating again. °· You cannot urinate after 8-12 hours. °· You develop a skin rash. °· You have fever. °· You have increasing redness at the site of your procedure. °Get help right away if: °· You have difficulty breathing. °· You have chest pain. °· You have unexpected bleeding. °· You feel that you are having a life-threatening or urgent problem. °This information is not intended to replace advice given to you by your health care provider. Make sure you discuss any questions you have with your health care provider. °Document Released: 02/20/2001 Document Revised: 04/18/2016 Document Reviewed: 10/29/2015 °Elsevier Interactive Patient Education © 2017 Elsevier Inc. ° °

## 2017-01-29 ENCOUNTER — Encounter: Payer: Self-pay | Admitting: Internal Medicine

## 2017-01-29 DIAGNOSIS — F329 Major depressive disorder, single episode, unspecified: Secondary | ICD-10-CM | POA: Insufficient documentation

## 2017-01-29 DIAGNOSIS — F32A Depression, unspecified: Secondary | ICD-10-CM | POA: Insufficient documentation

## 2017-01-29 NOTE — Assessment & Plan Note (Signed)
Still occurring.  Has desired no further w/up or evaluation.

## 2017-01-29 NOTE — Assessment & Plan Note (Signed)
Discussed with her today.  Will start zoloft.  Follow.

## 2017-01-29 NOTE — Assessment & Plan Note (Signed)
Chest pain as outlined.  Only occurred at night.  No pain since.  Increased prilosec to bid.  EKG - SR no acute ischemic changes.  Discussed with cardiology.  No further w/up felt warranted.  Planning for cataract surgery. Will need close intra op and post op monitoring of her heart rate and blood pressure to avoid extremes.

## 2017-01-29 NOTE — Assessment & Plan Note (Signed)
Blood pressure varying.  Overall ok.  Same medication regimen.  Follow pressures.  Follow metabolic panel.

## 2017-01-29 NOTE — Assessment & Plan Note (Signed)
Taking tramadol.  Stable.

## 2017-01-29 NOTE — Assessment & Plan Note (Signed)
Low cholesterol diet and exercise.  On simvastatin.  Follow lipid panel and liver function tests.   

## 2017-01-29 NOTE — Assessment & Plan Note (Signed)
Increase omeprazole to bid.  Follow.

## 2017-01-30 ENCOUNTER — Other Ambulatory Visit: Payer: Medicare Other

## 2017-01-30 ENCOUNTER — Ambulatory Visit: Payer: Medicare Other | Admitting: Anesthesiology

## 2017-01-30 ENCOUNTER — Ambulatory Visit
Admission: RE | Admit: 2017-01-30 | Discharge: 2017-01-30 | Disposition: A | Discharge status: Home / Self Care | Payer: Medicare Other | Source: Ambulatory Visit | Attending: Ophthalmology | Admitting: Ophthalmology

## 2017-01-30 ENCOUNTER — Encounter
Admission: RE | Disposition: A | Discharge status: Home / Self Care | Payer: Self-pay | Source: Ambulatory Visit | Attending: Ophthalmology

## 2017-01-30 DIAGNOSIS — H401123 Primary open-angle glaucoma, left eye, severe stage: Secondary | ICD-10-CM | POA: Diagnosis not present

## 2017-01-30 DIAGNOSIS — M810 Age-related osteoporosis without current pathological fracture: Secondary | ICD-10-CM | POA: Insufficient documentation

## 2017-01-30 DIAGNOSIS — I10 Essential (primary) hypertension: Secondary | ICD-10-CM | POA: Diagnosis not present

## 2017-01-30 DIAGNOSIS — Z7982 Long term (current) use of aspirin: Secondary | ICD-10-CM | POA: Insufficient documentation

## 2017-01-30 DIAGNOSIS — K219 Gastro-esophageal reflux disease without esophagitis: Secondary | ICD-10-CM | POA: Insufficient documentation

## 2017-01-30 DIAGNOSIS — E7800 Pure hypercholesterolemia, unspecified: Secondary | ICD-10-CM | POA: Insufficient documentation

## 2017-01-30 DIAGNOSIS — Z79899 Other long term (current) drug therapy: Secondary | ICD-10-CM | POA: Diagnosis not present

## 2017-01-30 DIAGNOSIS — Z85828 Personal history of other malignant neoplasm of skin: Secondary | ICD-10-CM | POA: Insufficient documentation

## 2017-01-30 DIAGNOSIS — H2512 Age-related nuclear cataract, left eye: Secondary | ICD-10-CM | POA: Diagnosis present

## 2017-01-30 DIAGNOSIS — Z8542 Personal history of malignant neoplasm of other parts of uterus: Secondary | ICD-10-CM | POA: Insufficient documentation

## 2017-01-30 DIAGNOSIS — F329 Major depressive disorder, single episode, unspecified: Secondary | ICD-10-CM | POA: Insufficient documentation

## 2017-01-30 HISTORY — PX: AQUEOUS SHUNT: SHX6305

## 2017-01-30 HISTORY — PX: CATARACT EXTRACTION W/PHACO: SHX586

## 2017-01-30 HISTORY — DX: Psychophysical visual disturbances: H53.16

## 2017-01-30 HISTORY — DX: Presence of external hearing-aid: Z97.4

## 2017-01-30 SURGERY — PHACOEMULSIFICATION, CATARACT, WITH IOL INSERTION
Anesthesia: Monitor Anesthesia Care | Site: Eye | Laterality: Left | Wound class: Clean

## 2017-01-30 MED ORDER — NA HYALUR & NA CHOND-NA HYALUR 0.4-0.35 ML IO KIT
PACK | INTRAOCULAR | Status: DC | PRN
  Administered 2017-01-30: 1 mL via INTRAOCULAR

## 2017-01-30 MED ORDER — LIDOCAINE HCL (PF) 2 % IJ SOLN
INTRAMUSCULAR | Status: DC | PRN
  Administered 2017-01-30: .3 mL

## 2017-01-30 MED ORDER — MOXIFLOXACIN HCL 0.5 % OP SOLN
1.0000 [drp] | OPHTHALMIC | Status: DC | PRN
  Administered 2017-01-30 (×3): 1 [drp] via OPHTHALMIC

## 2017-01-30 MED ORDER — ARMC OPHTHALMIC DILATING DROPS
1.0000 "application " | OPHTHALMIC | Status: DC | PRN
  Administered 2017-01-30 (×3): 1 via OPHTHALMIC

## 2017-01-30 MED ORDER — MIDAZOLAM HCL 2 MG/2ML IJ SOLN
INTRAMUSCULAR | Status: DC | PRN
  Administered 2017-01-30 (×2): 0.5 mg via INTRAVENOUS
  Administered 2017-01-30: 1.5 mg via INTRAVENOUS

## 2017-01-30 MED ORDER — FENTANYL CITRATE (PF) 100 MCG/2ML IJ SOLN
INTRAMUSCULAR | Status: DC | PRN
  Administered 2017-01-30 (×2): 50 ug via INTRAVENOUS

## 2017-01-30 MED ORDER — NEOMYCIN-POLYMYXIN-DEXAMETH 3.5-10000-0.1 OP OINT
TOPICAL_OINTMENT | OPHTHALMIC | Status: DC | PRN
  Administered 2017-01-30: 1 via OPHTHALMIC

## 2017-01-30 MED ORDER — MOXIFLOXACIN HCL 0.5 % OP SOLN
OPHTHALMIC | Status: DC | PRN
  Administered 2017-01-30: .1 mL via OPHTHALMIC

## 2017-01-30 MED ORDER — BRIMONIDINE TARTRATE-TIMOLOL 0.2-0.5 % OP SOLN
OPHTHALMIC | Status: DC | PRN
  Administered 2017-01-30: 1 [drp] via OPHTHALMIC

## 2017-01-30 MED ORDER — EPINEPHRINE PF 1 MG/ML IJ SOLN
INTRAOCULAR | Status: DC | PRN
  Administered 2017-01-30: 112 mL via OPHTHALMIC

## 2017-01-30 SURGICAL SUPPLY — 47 items
ALLOGRAFT TUTOPLST SCER0.5X1.0 (Tissue) ×1 IMPLANT
APL FBRTP 3 NS LF CTTN WD (MISCELLANEOUS) ×1
APPLICATOR COTTON TIP 3IN (MISCELLANEOUS) ×3 IMPLANT
BANDAGE EYE OVAL (MISCELLANEOUS) ×6 IMPLANT
BLADE SCLEROTME MULTI-SIDE (MISCELLANEOUS) IMPLANT
CANNULA ANT/CHMB 27GA (MISCELLANEOUS) ×6 IMPLANT
CORD BIP STRL DISP 12FT (MISCELLANEOUS) ×3 IMPLANT
CUP MEDICINE 2OZ PLAST GRAD ST (MISCELLANEOUS) ×3 IMPLANT
DISSECTOR HYDRO NUCLEUS 50X22 (MISCELLANEOUS) ×3 IMPLANT
ERASER TAPRD BLUNT STR 20-23GA (MISCELLANEOUS) ×1 IMPLANT
GLOVE BIO SURGEON STRL SZ7 (GLOVE) ×3 IMPLANT
GLOVE SURG LX 6.5 MICRO (GLOVE) ×2
GLOVE SURG LX STRL 6.5 MICRO (GLOVE) ×1 IMPLANT
GOWN STRL REUS W/ TWL LRG LVL3 (GOWN DISPOSABLE) ×2 IMPLANT
GOWN STRL REUS W/TWL LRG LVL3 (GOWN DISPOSABLE) ×6
KNIFE OPTIMUM SIDEPORT 15DEG (MISCELLANEOUS) IMPLANT
KNIFE SIDECUT EYE (MISCELLANEOUS) ×3 IMPLANT
LENS IOL TECNIS ITEC 19.0 (Intraocular Lens) ×3 IMPLANT
MARKER SKIN DUAL TIP RULER LAB (MISCELLANEOUS) ×3 IMPLANT
NDL SAFETY 22GX1.5 (NEEDLE) ×3 IMPLANT
NEEDLE FILTER BLUNT 18X 1/2SAF (NEEDLE) ×4
NEEDLE FILTER BLUNT 18X1 1/2 (NEEDLE) ×2 IMPLANT
NEEDLE HYPO 30X.5 LL (NEEDLE) IMPLANT
PACK CATARACT BRASINGTON (MISCELLANEOUS) ×3 IMPLANT
PACK EYE AFTER SURG (MISCELLANEOUS) ×3 IMPLANT
PACK OPTHALMIC (MISCELLANEOUS) ×3 IMPLANT
PROTECTOR LASIK FLAP (MISCELLANEOUS) ×3 IMPLANT
RING MALYGIN 7.0 (MISCELLANEOUS) ×3 IMPLANT
SOL BAL SALT 15ML (MISCELLANEOUS) ×6
SOLUTION BAL SALT 15ML (MISCELLANEOUS) ×2 IMPLANT
SPONGE SURG I SPEAR (MISCELLANEOUS) ×9 IMPLANT
SUT ETHILON 10-0 CS-B-6CS-B-6 (SUTURE) ×3
SUT ETHILON 8 0 TG100 8 (SUTURE) ×3 IMPLANT
SUT VICRYL  9 0 (SUTURE)
SUT VICRYL 7 0 TG140 8 (SUTURE) ×3 IMPLANT
SUT VICRYL 8 0 BV 130 5 (SUTURE) ×3 IMPLANT
SUT VICRYL 9 0 (SUTURE) IMPLANT
SUTURE EHLN 10-0 CS-B-6CS-B-6 (SUTURE) ×1 IMPLANT
SYR 3ML LL SCALE MARK (SYRINGE) ×9 IMPLANT
SYR TB 1ML LUER SLIP (SYRINGE) ×3 IMPLANT
SYRINGE 10CC LL (SYRINGE) ×3 IMPLANT
TAPERED BLUNT TIP STR 20-23GA (MISCELLANEOUS) ×3
TUTOPLAST SCIERA 0.5X1.0 (Tissue) ×3 IMPLANT
VALVE GLAUCOMA AHMED (Prosthesis & Implant Heart) ×3 IMPLANT
WATER STERILE IRR 250ML POUR (IV SOLUTION) ×3 IMPLANT
WICK EYE OCUCEL (MISCELLANEOUS) ×3 IMPLANT
WIPE NON LINTING 3.25X3.25 (MISCELLANEOUS) ×3 IMPLANT

## 2017-01-30 NOTE — H&P (Signed)
H+P reviewed and is up to date, please see paper chart.  

## 2017-01-30 NOTE — Op Note (Signed)
Date of Surgery: 01/30/2017  PREOPERATIVE DIAGNOSES:  1. Visually significant nuclear sclerotic cataract, left eye. 2. Primary open angle glaucoma, severe stage, left eye.  POSTOPERATIVE DIAGNOSES: Same  PROCEDURES PERFORMED:  1. Cataract extraction with intraocular lens implant, left eye. 2. Trabeculectomy with mitomycin C and ExPress shunt, left eye.  SURGEON: Almon Hercules, M.D.  ANESTHESIA: MAC and topical  IMPLANTS:   Implant Name Type Inv. Item Serial No. Manufacturer Lot No. LRB No. Used  LENS IOL DIOP 19.0 - [id number] Intraocular Lens LENS IOL DIOP 19.0 [id number] AMO  Left 1  TUTOPLAST [place] 0.5X1.0 - [id number] Tissue TUTOPLAST SCIERA 0.5X1.0 [id number] RIT [id number] Left 1  VALVE GLAUCOMA AHMED - [id number] Prosthesis & Implant Heart VALVE GLAUCOMA AHMED [id number] [employer] [id number] Left 1    COMPLICATIONS: None.  DESCRIPTION OF PROCEDURE: Therapeutic options were discussed with the patient preoperatively, including a discussion of risks and benefits of surgery. Informed consent was obtained. An IOL-Master and immersion biometry were used to take the lens measurements, and a dilated fundus exam was performed within 6 months of the surgical date.  The patient was premedicated and brought to the operating room and placed on the operating table in the supine position. After adequate anesthesia, the patient was prepped and draped in the usual sterile ophthalmic fashion. A wire lid speculum was inserted and the microscope was positioned. A Superblade was used to create a paracentesis site at the limbus and a small amount of dilute preservative free lidocaine was instilled into the anterior chamber, followed by dispersive viscoelastic. A clear corneal incision was created temporally using a 2.4 mm keratome blade. Capsulorrhexis was then performed. In situ phacoemulsification was performed.  Cortical material was removed with the irrigation-aspiration unit. Dispersive  viscoelastic was instilled to open the capsular bag. A posterior chamber intraocular lens with the specifications above was inserted and positioned. Irrigation-aspiration was used to remove all viscoelastic. Mitosol was injected into the superotemporal subconjunctival space and massaged superonasally.  The temporal incision was closed with 10-O nylon suture and attention was directed to the superonasal quadrant.  A superior 7-0 vicryl corneal traction suture was placed. Using Westcott scissors, a small incision through conjunctiva and Tenons was made in the superotemporal quadrant approximately 4 mm posterior to the limbus. A block which consisted of 2 mL of 50% of 4% Xylocaine without epinephrine and 50% of 0.75% Marcaine was given at sub-Tenons level. Tenons were then dissected from sclera posteriorly and anteriorly with blunt Westcott dissection. Hemostasis was achieved with cautery.  An Ahmed drainage device, model FP7, was removed from its packaging, inspected, and found to be in good condition.  Balanced salt solution on a cannula was used to irrigate the tube, and free flow was noted above the plate. The implant was placed in the retrobulbar space between the superior and lateral rectus muscles and two 8-0 nylon sutures were placed through the eyelets of the implant. A 22-gauge needle was used to enter the anterior chamber 3 mm from the superior limbus supero-temporally.  The tube was trimmed to length andplaced through the needle tract and set to rest above the iris with no corneal touch noted.  The tube was then approximated to the globe using a single 8-0 nylon figure-of- eight suture.  Tutoplast sclera overlay placed over the tube and sewn in place using 1 interrupted 7-0 vicryl suture.  The conjunctival incision was closed with running 8-0 Vicryl sutures.  At the end of the case, the corneal limbal traction  suture was removed as was the wire lid speculum.  The eye was dressed with an application of  Maxitrol ointment, and a Fox shield was placed.  The patient was brought to the recovery area having tolerated the procedure with no complications.

## 2017-01-30 NOTE — Transfer of Care (Signed)
Immediate Anesthesia Transfer of Care Note  Patient: Jill Castro  Procedure(s) Performed: Procedure(s) with comments: CATARACT EXTRACTION PHACO AND INTRAOCULAR LENS PLACEMENT (IOC)  left (Left) AQUEOUS SHUNT ahmed tube (Left) - ahmed tube shunt with scleral patch graft  Patient Location: PACU  Anesthesia Type: MAC  Level of Consciousness: awake, alert  and patient cooperative  Airway and Oxygen Therapy: Patient Spontanous Breathing and Patient connected to supplemental oxygen  Post-op Assessment: Post-op Vital signs reviewed, Patient's Cardiovascular Status Stable, Respiratory Function Stable, Patent Airway and No signs of Nausea or vomiting  Post-op Vital Signs: Reviewed and stable  Complications: No apparent anesthesia complications

## 2017-01-30 NOTE — Anesthesia Postprocedure Evaluation (Signed)
Anesthesia Post Note  Patient: Jill Castro  Procedure(s) Performed: Procedure(s) (LRB): CATARACT EXTRACTION PHACO AND INTRAOCULAR LENS PLACEMENT ([place])  left (Left) AQUEOUS SHUNT ahmed tube (Left)  Patient location during evaluation: PACU Anesthesia Type: MAC Level of consciousness: awake Pain management: pain level controlled Vital Signs Assessment: post-procedure vital signs reviewed and stable Respiratory status: spontaneous breathing Cardiovascular status: blood pressure returned to baseline Postop Assessment: no headache Anesthetic complications: no    Jaci Standard, III,  Kemi Gell D

## 2017-01-30 NOTE — Anesthesia Procedure Notes (Signed)
Procedure Name: MAC Performed by: Mayme Genta Pre-anesthesia Checklist: Patient identified, Emergency Drugs available, Suction available, Timeout performed and Patient being monitored Patient Re-evaluated:Patient Re-evaluated prior to inductionOxygen Delivery Method: Nasal cannula Placement Confirmation: positive ETCO2

## 2017-01-30 NOTE — Anesthesia Preprocedure Evaluation (Signed)
Anesthesia Evaluation  Patient identified by MRN, date of birth, ID band Patient awake    Reviewed: Allergy & Precautions, NPO status , Patient's Chart, lab work & pertinent test results  Airway Mallampati: II  TM Distance: >3 FB Neck ROM: full    Dental no notable dental hx.    Pulmonary    Pulmonary exam normal        Cardiovascular hypertension, +CHF  Normal cardiovascular exam     Neuro/Psych Depression    GI/Hepatic Neg liver ROS, Medicated,  Endo/Other  negative endocrine ROS  Renal/GU   negative genitourinary   Musculoskeletal   Abdominal   Peds  Hematology negative hematology ROS (+)   Anesthesia Other Findings   Reproductive/Obstetrics                             Anesthesia Physical Anesthesia Plan  ASA: III  Anesthesia Plan: MAC   Post-op Pain Management:    Induction:   Airway Management Planned:   Additional Equipment:   Intra-op Plan:   Post-operative Plan:   Informed Consent:   Plan Discussed with:   Anesthesia Plan Comments:         Anesthesia Quick Evaluation

## 2017-01-31 ENCOUNTER — Encounter: Payer: Self-pay | Admitting: Ophthalmology

## 2017-01-31 ENCOUNTER — Other Ambulatory Visit: Payer: Self-pay | Admitting: *Deleted

## 2017-01-31 MED ORDER — LOSARTAN POTASSIUM 100 MG PO TABS: 100.0000 mg | ORAL_TABLET | Freq: Every day | ORAL | 3 refills | Status: DC

## 2017-02-01 ENCOUNTER — Ambulatory Visit: Payer: Medicare Other | Admitting: Internal Medicine

## 2017-02-09 ENCOUNTER — Telehealth: Payer: Self-pay | Admitting: Internal Medicine

## 2017-02-09 NOTE — Telephone Encounter (Signed)
Spoke with the patient, verbalized understanding. She will call the eye doctor to get confirmation of the nasocort. thanks

## 2017-02-09 NOTE — Telephone Encounter (Signed)
Patient stated she is hoarse, and a slight cough. Was exposed to daughter whom has a viral URI. Patient would like to know if she could take OTC for cough productive white mucus, slight nasal congestion X 48 hours. Afebrile , denies chills. Patient had surgery on left eye X 2weeks ago, patient using Ilvero, Durazol, Amoxafloxacin, per [facility].

## 2017-02-09 NOTE — Telephone Encounter (Signed)
Pt called and stated that she had eye surgery a week ago,and daughter was staying with her while she recovered. Daughter became sick and went to the doctor and they told her to take benadryl. Pt thinks that she has a touch of what her daughter has and would lie to know if she can take benadryl or something similar.  Call pt @ [phone]

## 2017-02-09 NOTE — Telephone Encounter (Signed)
She can take robituss bid prn.  Also can use saline nasal spray - flush nose at least 2-3x/day.  Also have her call her eye doctor and see if ok to use nasacort (with her eye issues).  If so, can try nasacort nasal spray 2 sprays each nostril one time per day.  Do this in the evening.  If any change or worsening symptoms, needs evaluation.

## 2017-02-09 NOTE — Telephone Encounter (Signed)
Attempted to reach patient, phone is not working properly, she answered, but not able to hear my end of the call.  Will try again.

## 2017-03-03 ENCOUNTER — Other Ambulatory Visit: Payer: Self-pay | Admitting: Internal Medicine

## 2017-03-08 ENCOUNTER — Other Ambulatory Visit: Payer: Self-pay | Admitting: Physician Assistant

## 2017-03-17 ENCOUNTER — Telehealth: Payer: Self-pay | Admitting: Internal Medicine

## 2017-03-17 NOTE — Telephone Encounter (Signed)
Pt son called and stated that he would like Dr. Nicki Reaper to put in a referral for Neurology. Please advise, thank you!  Call son Carlis Abbott @ [phone]

## 2017-03-17 NOTE — Telephone Encounter (Signed)
Left message to return call to our office.  

## 2017-03-20 ENCOUNTER — Other Ambulatory Visit (INDEPENDENT_AMBULATORY_CARE_PROVIDER_SITE_OTHER): Payer: Medicare Other

## 2017-03-20 DIAGNOSIS — E7800 Pure hypercholesterolemia, unspecified: Secondary | ICD-10-CM | POA: Diagnosis not present

## 2017-03-20 DIAGNOSIS — R739 Hyperglycemia, unspecified: Secondary | ICD-10-CM | POA: Diagnosis not present

## 2017-03-20 DIAGNOSIS — I159 Secondary hypertension, unspecified: Secondary | ICD-10-CM

## 2017-03-20 LAB — LIPID PANEL
CHOL/HDL RATIO: 2
CHOLESTEROL: 171 mg/dL (ref 0–200)
HDL: 99 mg/dL (ref >39.00)
LDL CALC: 53 mg/dL (ref 0–99)
NonHDL: 71.61
Triglycerides: 95 mg/dL (ref 0.0–149.0)
VLDL: 19 mg/dL (ref 0.0–40.0)

## 2017-03-20 LAB — BASIC METABOLIC PANEL
BUN: 50 mg/dL — ABNORMAL HIGH (ref 6–23)
CALCIUM: 10.3 mg/dL (ref 8.4–10.5)
CO2: 23 mEq/L (ref 19–32)
Chloride: 106 mEq/L (ref 96–112)
Creatinine, Ser: 1.72 mg/dL — ABNORMAL HIGH (ref 0.40–1.20)
GFR: 29.74 mL/min — AB (ref >60.00)
GLUCOSE: 108 mg/dL — AB (ref 70–99)
POTASSIUM: 4.3 meq/L (ref 3.5–5.1)
SODIUM: 138 meq/L (ref 135–145)

## 2017-03-20 LAB — HEPATIC FUNCTION PANEL
ALK PHOS: 94 U/L (ref 39–117)
ALT: 20 U/L (ref 0–35)
AST: 26 U/L (ref 0–37)
Albumin: 4.5 g/dL (ref 3.5–5.2)
BILIRUBIN DIRECT: 0.1 mg/dL (ref 0.0–0.3)
BILIRUBIN TOTAL: 0.5 mg/dL (ref 0.2–1.2)
Total Protein: 7.5 g/dL (ref 6.0–8.3)

## 2017-03-20 LAB — HEMOGLOBIN A1C: HEMOGLOBIN A1C: 5.8 % (ref 4.6–6.5)

## 2017-03-20 NOTE — Telephone Encounter (Signed)
Spoke to son states that she is now having visual as well as smelling things. She is now getting agitated at times with some of the visions she had been seeing. Patient has app with you this week.

## 2017-03-20 NOTE — Telephone Encounter (Signed)
Please call pt's son at  [phone]

## 2017-03-21 NOTE — Telephone Encounter (Signed)
I do not mind placing order for neurology referral.  Please ask son if pt agreeable to referral.  See his original message.

## 2017-03-21 NOTE — Telephone Encounter (Signed)
I spoke with son he wold like to hold off until app with you this week. He just wanted to make sure you were aware of situation

## 2017-03-23 ENCOUNTER — Ambulatory Visit (INDEPENDENT_AMBULATORY_CARE_PROVIDER_SITE_OTHER): Payer: Medicare Other

## 2017-03-23 ENCOUNTER — Ambulatory Visit: Payer: Medicare Other | Admitting: Internal Medicine

## 2017-03-23 ENCOUNTER — Ambulatory Visit (INDEPENDENT_AMBULATORY_CARE_PROVIDER_SITE_OTHER): Payer: Medicare Other | Admitting: Internal Medicine

## 2017-03-23 ENCOUNTER — Encounter: Payer: Self-pay | Admitting: Internal Medicine

## 2017-03-23 VITALS — BP 138/62 | HR 87 | Temp 98.7°F | Resp 12 | Ht 64.0 in | Wt 129.6 lb

## 2017-03-23 DIAGNOSIS — F32A Depression, unspecified: Secondary | ICD-10-CM

## 2017-03-23 DIAGNOSIS — I10 Essential (primary) hypertension: Secondary | ICD-10-CM | POA: Diagnosis not present

## 2017-03-23 DIAGNOSIS — G8929 Other chronic pain: Secondary | ICD-10-CM | POA: Diagnosis not present

## 2017-03-23 DIAGNOSIS — M545 Low back pain: Secondary | ICD-10-CM

## 2017-03-23 DIAGNOSIS — N183 Chronic kidney disease, stage 3 unspecified: Secondary | ICD-10-CM

## 2017-03-23 DIAGNOSIS — E7800 Pure hypercholesterolemia, unspecified: Secondary | ICD-10-CM

## 2017-03-23 DIAGNOSIS — K2100 Gastro-esophageal reflux disease with esophagitis, without bleeding: Secondary | ICD-10-CM

## 2017-03-23 DIAGNOSIS — F329 Major depressive disorder, single episode, unspecified: Secondary | ICD-10-CM | POA: Diagnosis not present

## 2017-03-23 DIAGNOSIS — Z8542 Personal history of malignant neoplasm of other parts of uterus: Secondary | ICD-10-CM

## 2017-03-23 DIAGNOSIS — R441 Visual hallucinations: Secondary | ICD-10-CM | POA: Diagnosis not present

## 2017-03-23 NOTE — Progress Notes (Signed)
Pre-visit discussion using our clinic review tool. No additional management support is needed unless otherwise documented below in the visit note.  

## 2017-03-23 NOTE — Progress Notes (Signed)
[name], female   DOB: May 28, 1927, 83 y.o.   MRN: [id number]   Subjective:    [name], female    DOB: 10/03/1927, 83 y.o.   MRN: [id number]  HPI  Patient here for a scheduled follow up.  She reports increased back pain.  Back pain has been an issue for her for a while.  Has been seen by neurosurgery.  Not a surgical candidate.  Has seen pain clinic previously.  Discussed referral back to to Dr Sharlet Salina.  Pain has increased.  Limits her activity.  Living by herself.  Still trying to cope with her husband's death.  Increased stress related to this.  No chest pain.  No sob.  Acid reflux controlled.  No abdominal pain.  Bowels moving.  She is still having visual hallucinations.  They do not scare her.  Discussed further evaluation.  She is eating.    Past Medical History:  Diagnosis Date  . Sherran Needs syndrome   . Diastolic CHF, acute (HCC)    Echo (8/10) with EF 60-65%, mild LVH, mild to moderate TR, PASP 40 mmHg.  . Dizziness    Thought to be side effect of Norvasc  . GERD (gastroesophageal reflux disease)   . Glaucoma   . HTN (hypertension)   . Hypercalcemia    On HCTZ  . Hypercholesterolemia   . Hyperkalemia   . Macular degeneration   . Osteoporosis   . Renal fibromuscular dysplasia ([place])   . Renal insufficiency   . S/P cardiac catheterization 05/2002   Luminal irregularities only in the coronaries. EF 55%. There was some irregulatrity in the right renal artery suggestive of possible fibromuscular dysplasia. Myoview in 5/08 showed no ischemia or infarction. Lexiscan myoview at [facility] (10/10) showed EF 55%, normal wall motion, no evience ofor ischemia or infarction.  . Scoliosis associated with other condition    Chronic back pain  . Uterine cancer ([place])    S/P hypsterectomy in 2008  . Wears hearing aid    bilateral   Past Surgical History:  Procedure Laterality Date  . AQUEOUS SHUNT Left 01/30/2017   Procedure: AQUEOUS SHUNT ahmed tube;   Surgeon: Ronnell Freshwater, MD;  Location: [place];  Service: Ophthalmology;  Laterality: Left;  ahmed tube shunt with scleral patch graft  . CARDIAC CATHETERIZATION     Left  . CATARACT EXTRACTION Right 07/03/2013   [facility] Dr Wallace Going  . CATARACT EXTRACTION W/PHACO Left 01/30/2017   Procedure: CATARACT EXTRACTION PHACO AND INTRAOCULAR LENS PLACEMENT ([place])  left;  Surgeon: Ronnell Freshwater, MD;  Location: [place];  Service: Ophthalmology;  Laterality: Left;  . REFRACTIVE SURGERY    . TOTAL ABDOMINAL HYSTERECTOMY W/ BILATERAL SALPINGOOPHORECTOMY     stage I C well differentiated adenocarcinoma   Family History  Problem Relation Age of Onset  . Heart disease Father   . Heart disease Mother   . Parkinson's disease Mother   . Diabetes Cousin    Social History   Social History  . Marital status: Widowed    Spouse name: N/A  . Number of children: 2  . Years of education: N/A   Social History Main Topics  . Smoking status: Never Smoker  . Smokeless tobacco: Never Used  . Alcohol use No  . Drug use: No  . Sexual activity: Not Asked   Other Topics Concern  . None   Social History Narrative   Lives with husband in [place]  Outpatient Encounter Prescriptions as of 03/23/2017  Medication Sig  . amLODipine (NORVASC) 5 MG tablet Take 1 tablet (5 mg total) by mouth 2 (two) times daily as needed.  Marland Kitchen amLODipine (NORVASC) 5 MG tablet Take 1 tablet (5 mg total) by mouth 2 (two) times daily as needed.  Marland Kitchen aspirin 81 MG EC tablet Take 81 mg by mouth daily.    . B Complex Vitamins (PA B-COMPLEX WITH B-12) TABS Take 1 tablet by mouth daily.    . brimonidine (ALPHAGAN P) 0.1 % SOLN Place 1 drop into the left eye 2 (two) times daily.  . Calcium Carbonate-Vitamin D (CALCIUM-VITAMIN D) 600-200 MG-UNIT CAPS Take by mouth. Takes 1/2 tablet daily.  . dorzolamide-timolol (COSOPT) 22.3-6.8 MG/ML ophthalmic solution Place 1 drop into the left eye 2 (two) times  daily.   . furosemide (LASIX) 40 MG tablet Take 1 tablet (40 mg total) by mouth daily.  . Glucosamine-Chondroitin 1500-1200 MG/30ML LIQD Take by mouth. Takes 1/2 tablet daily.  . hydrALAZINE (APRESOLINE) 100 MG tablet Take 1 tablet (100 mg total) by mouth 4 (four) times daily.  Marland Kitchen latanoprost (XALATAN) 0.005 % ophthalmic solution Place 1 drop into the left eye at bedtime.   Marland Kitchen losartan (COZAAR) 100 MG tablet Take 1 tablet (100 mg total) by mouth daily.  . Magnesium 250 MG TABS Take 250 mg by mouth daily.  . multivitamin (THERAGRAN) per tablet Take 1 tablet by mouth daily.    Marland Kitchen nystatin cream (MYCOSTATIN) Apply 1 application topically 2 (two) times daily.  Marland Kitchen omeprazole (PRILOSEC) 20 MG capsule TAKE 1 CAPSULE EVERY DAY  . Polyethylene Glycol 3350 (MIRALAX PO) Take by mouth daily.   . ranitidine (ZANTAC) 75 MG tablet Take 75 mg by mouth at bedtime.  . sertraline (ZOLOFT) 25 MG tablet Take 1 tablet (25 mg total) by mouth daily.  . simvastatin (ZOCOR) 10 MG tablet TAKE ONE TABLET BY MOUTH EVERY EVENING AT BEDTIME   No facility-administered encounter medications on file as of 03/23/2017.     Review of Systems  Constitutional: Negative for appetite change and unexpected weight change.  HENT: Negative for congestion and sinus pressure.   Respiratory: Negative for cough, chest tightness and shortness of breath.   Cardiovascular: Negative for chest pain, palpitations and leg swelling.  Gastrointestinal: Negative for abdominal pain, diarrhea, nausea and vomiting.  Genitourinary: Negative for difficulty urinating and dysuria.  Musculoskeletal: Positive for back pain. Negative for joint swelling.  Skin: Negative for color change and rash.  Neurological: Negative for dizziness, light-headedness and headaches.  Psychiatric/Behavioral: Positive for hallucinations. Negative for agitation.       Objective:     Blood pressure rechecked by me:  138/62  Physical Exam  Constitutional: She appears  well-developed and well-nourished. No distress.  HENT:  Nose: Nose normal.  Mouth/Throat: Oropharynx is clear and moist.  Neck: Neck supple. No thyromegaly present.  Cardiovascular: Normal rate and regular rhythm.   Pulmonary/Chest: Breath sounds normal. No respiratory distress. She has no wheezes.  Abdominal: Soft. Bowel sounds are normal. There is no tenderness.  Musculoskeletal: She exhibits no edema or tenderness.  Lymphadenopathy:    She has no cervical adenopathy.  Skin: No rash noted. No erythema.  Psychiatric: She has a normal mood and affect. Her behavior is normal.    BP 138/62   Pulse 87   Temp 98.7 F (37.1 C) (Oral)   Resp 12   Ht 5\' 4"  (1.626 m)   Wt 129 lb 9.6 oz (58.8 kg)  SpO2 96%   BMI 22.25 kg/m  Wt Readings from Last 3 Encounters:  03/23/17 129 lb 9.6 oz (58.8 kg)  01/30/17 132 lb (59.9 kg)  01/26/17 135 lb (61.2 kg)     Lab Results  Component Value Date   WBC 9.4 05/25/2016   HGB 12.7 05/25/2016   HCT 38.5 05/25/2016   PLT 204.0 05/25/2016   GLUCOSE 108 (H) 03/20/2017   CHOL 171 03/20/2017   TRIG 95.0 03/20/2017   HDL 99.00 03/20/2017   LDLCALC 53 03/20/2017   ALT 20 03/20/2017   AST 26 03/20/2017   NA 138 03/20/2017   K 4.3 03/20/2017   CL 106 03/20/2017   CREATININE 1.72 (H) 03/20/2017   BUN 50 (H) 03/20/2017   CO2 23 03/20/2017   TSH 3.94 05/25/2016   HGBA1C 5.8 03/20/2017    No results found.     Assessment & Plan:   Problem List Items Addressed This Visit    Chronic back pain - Primary    Taking tramadol.  Increased pain.  Will repeat xray to confirm no acute change.  Pain clinic.        Relevant Orders   DG Thoracic Spine 2 View (Completed)   DG Lumbar Spine 2-3 Views (Completed)   Chronic kidney disease (CKD), stage III (moderate)    Followed by nephrology.  Recent creatinine slightly increased from previous check.  Schedule a f/u with nephrology.        Relevant Orders   Ambulatory referral to Nephrology    Depression    Started zoloft last visit.  Discussed with her today.  She desires no further intervention at this time.  Follow.        GERD (gastroesophageal reflux disease)    Controlled on omeprazole.        History of endometrial cancer    Was followed by gyn.  Stable.  No problems.        Hypercholesterolemia    On simvastatin.  Follow lipid panel and liver function tests.        Hypertension    Blood pressure under reasonable control.  Follow pressures.  Same medication regimen.        Visual hallucinations    Persistent.  May have increased some.  Discussed with her today.  Will have neurology evaluate.        Relevant Orders   Ambulatory referral to Neurology       Einar Pheasant, MD

## 2017-03-24 ENCOUNTER — Telehealth: Payer: Self-pay

## 2017-03-24 DIAGNOSIS — G8929 Other chronic pain: Secondary | ICD-10-CM

## 2017-03-24 DIAGNOSIS — M545 Low back pain: Principal | ICD-10-CM

## 2017-03-24 NOTE — Telephone Encounter (Signed)
-----   Message from Einar Pheasant, MD sent at 03/24/2017  4:41 AM EDT ----- Please call and notify pt that her xray reveals the curvature of her spine and she does have compression fractures.  Does not appear to be significantly changed when compared to previous xray.  Diffuse arthritis changes in the lower back.  Agree with f/u with Dr Sharlet Salina.  Someone will contact her with appt.

## 2017-03-24 NOTE — Telephone Encounter (Signed)
Called patient gave all information Per patient she was seen at [facility] pain several years ago would like to go their if we can send referral?

## 2017-03-24 NOTE — Telephone Encounter (Signed)
Pt called back returning your call. Thank you!  Call pt @ [phone]

## 2017-03-24 NOTE — Telephone Encounter (Signed)
Left message to return call to our office.  

## 2017-03-26 NOTE — Telephone Encounter (Signed)
Order placed for pain clinic referral.   

## 2017-03-28 ENCOUNTER — Telehealth: Payer: Self-pay | Admitting: Internal Medicine

## 2017-03-28 NOTE — Telephone Encounter (Signed)
Left pt message asking to call Allison back directly at [phone] to schedule AWV. Thanks! °

## 2017-04-01 ENCOUNTER — Encounter: Payer: Self-pay | Admitting: Internal Medicine

## 2017-04-01 NOTE — Assessment & Plan Note (Signed)
Controlled on omeprazole.   

## 2017-04-01 NOTE — Assessment & Plan Note (Signed)
On simvastatin.  Follow lipid panel and liver function tests.   

## 2017-04-01 NOTE — Assessment & Plan Note (Signed)
Persistent.  May have increased some.  Discussed with her today.  Will have neurology evaluate.

## 2017-04-01 NOTE — Assessment & Plan Note (Signed)
Followed by nephrology.  Recent creatinine slightly increased from previous check.  Schedule a f/u with nephrology.

## 2017-04-01 NOTE — Assessment & Plan Note (Signed)
Taking tramadol.  Increased pain.  Will repeat xray to confirm no acute change.  Pain clinic.

## 2017-04-01 NOTE — Assessment & Plan Note (Signed)
Started zoloft last visit.  Discussed with her today.  She desires no further intervention at this time.  Follow.

## 2017-04-01 NOTE — Assessment & Plan Note (Signed)
Blood pressure under reasonable control.  Follow pressures.  Same medication regimen.

## 2017-04-01 NOTE — Assessment & Plan Note (Signed)
Was followed by gyn.  Stable.  No problems.

## 2017-04-28 NOTE — Telephone Encounter (Signed)
Scheduled 05/01/17 °

## 2017-05-01 ENCOUNTER — Ambulatory Visit (INDEPENDENT_AMBULATORY_CARE_PROVIDER_SITE_OTHER): Payer: Medicare Other

## 2017-05-01 VITALS — BP 110/80 | HR 87 | Temp 98.2°F | Resp 17 | Ht 60.0 in | Wt 130.0 lb

## 2017-05-01 DIAGNOSIS — Z0000 Encounter for general adult medical examination without abnormal findings: Secondary | ICD-10-CM | POA: Diagnosis not present

## 2017-05-01 NOTE — Patient Instructions (Addendum)
  Ms. Guirguis , Thank you for taking time to come for your Medicare Wellness Visit. I appreciate your ongoing commitment to your health goals. Please review the following plan we discussed and let me know if I can assist you in the future.   Follow up with Dr. Nicki Reaper as needed.    Bring a copy of your [place] and/or Living Will to be scanned into chart.  Neurology appointment as directed.  Have a great day!  These are the goals we discussed: Goals    . Healthy Lifestyle          Stay active  Stay hydrated  Low carb foods        This is a list of the screening recommended for you and due dates:  Health Maintenance  Topic Date Due  . Tetanus Vaccine  02/16/1948  . DEXA scan (bone density measurement)  02/15/1994  . Pneumonia vaccines (2 of 2 - PPSV23) 04/25/2015  . Mammogram  07/20/2018*  . Flu Shot  06/28/2017  *Topic was postponed. The date shown is not the original due date.

## 2017-05-01 NOTE — Progress Notes (Signed)
Subjective:   LYRICK WORLAND is a 83 y.o. female who presents for Medicare Annual (Subsequent) preventive examination.  Review of Systems:  No ROS.  Medicare Wellness Visit.  Cardiac Risk Factors include: advanced age (>28men, >33 women);hypertension     Objective:     Vitals: BP 110/80 (BP Location: Left Arm, Patient Position: Sitting, Cuff Size: Normal)   Pulse 87   Temp 98.2 F (36.8 C) (Oral)   Resp 17   Ht 5' (1.524 m)   Wt 130 lb (59 kg)   SpO2 96%   BMI 25.39 kg/m   Body mass index is 25.39 kg/m.   Tobacco History  Smoking Status  . Never Smoker  Smokeless Tobacco  . Never Used     Counseling given: Not Answered   Past Medical History:  Diagnosis Date  . Sherran Needs syndrome   . Diastolic CHF, acute (HCC)    Echo (8/10) with EF 60-65%, mild LVH, mild to moderate TR, PASP 40 mmHg.  . Dizziness    Thought to be side effect of Norvasc  . GERD (gastroesophageal reflux disease)   . Glaucoma   . HTN (hypertension)   . Hypercalcemia    On HCTZ  . Hypercholesterolemia   . Hyperkalemia   . Macular degeneration   . Osteoporosis   . Renal fibromuscular dysplasia ([place])   . Renal insufficiency   . S/P cardiac catheterization 05/2002   Luminal irregularities only in the coronaries. EF 55%. There was some irregulatrity in the right renal artery suggestive of possible fibromuscular dysplasia. Myoview in 5/08 showed no ischemia or infarction. Lexiscan myoview at [facility] (10/10) showed EF 55%, normal wall motion, no evience ofor ischemia or infarction.  . Scoliosis associated with other condition    Chronic back pain  . Uterine cancer ([place])    S/P hypsterectomy in 2008  . Wears hearing aid    bilateral   Past Surgical History:  Procedure Laterality Date  . AQUEOUS SHUNT Left 01/30/2017   Procedure: AQUEOUS SHUNT ahmed tube;  Surgeon: Ronnell Freshwater, MD;  Location: [place];  Service: Ophthalmology;  Laterality: Left;  ahmed tube shunt  with scleral patch graft  . CARDIAC CATHETERIZATION     Left  . CATARACT EXTRACTION Right 07/03/2013   [facility] Dr Wallace Going  . CATARACT EXTRACTION W/PHACO Left 01/30/2017   Procedure: CATARACT EXTRACTION PHACO AND INTRAOCULAR LENS PLACEMENT ([place])  left;  Surgeon: Ronnell Freshwater, MD;  Location: [place];  Service: Ophthalmology;  Laterality: Left;  . REFRACTIVE SURGERY    . TOTAL ABDOMINAL HYSTERECTOMY W/ BILATERAL SALPINGOOPHORECTOMY     stage I C well differentiated adenocarcinoma   Family History  Problem Relation Age of Onset  . Heart disease Father   . Heart disease Mother   . Parkinson's disease Mother   . Diabetes Cousin    History  Sexual Activity  . Sexual activity: Not on file    Outpatient Encounter Prescriptions as of 05/01/2017  Medication Sig  . amLODipine (NORVASC) 5 MG tablet Take 1 tablet (5 mg total) by mouth 2 (two) times daily as needed.  Marland Kitchen aspirin 81 MG EC tablet Take 81 mg by mouth daily.    . B Complex Vitamins (PA B-COMPLEX WITH B-12) TABS Take 1 tablet by mouth daily.    . Calcium Carbonate-Vitamin D (CALCIUM-VITAMIN D) 600-200 MG-UNIT CAPS Take by mouth. Takes 1/2 tablet daily.  . furosemide (LASIX) 40 MG tablet Take 1 tablet (40 mg total) by mouth  daily.  . Glucosamine-Chondroitin 1500-1200 MG/30ML LIQD Take by mouth. Takes 1/2 tablet daily.  . hydrALAZINE (APRESOLINE) 100 MG tablet Take 1 tablet (100 mg total) by mouth 4 (four) times daily.  Marland Kitchen latanoprost (XALATAN) 0.005 % ophthalmic solution Place 1 drop into the left eye at bedtime.   Marland Kitchen losartan (COZAAR) 100 MG tablet Take 1 tablet (100 mg total) by mouth daily.  . Magnesium 250 MG TABS Take 250 mg by mouth daily.  . multivitamin (THERAGRAN) per tablet Take 1 tablet by mouth daily.    Marland Kitchen nystatin cream (MYCOSTATIN) Apply 1 application topically 2 (two) times daily.  . Polyethylene Glycol 3350 (MIRALAX PO) Take by mouth daily.   . ranitidine (ZANTAC) 150 MG tablet TAKE ONE TABLET BY  MOUTH TWICE DAILY 10 TO 15 MIN BEFORE MEAL  . ranitidine (ZANTAC) 75 MG tablet Take 75 mg by mouth at bedtime.  . sertraline (ZOLOFT) 25 MG tablet Take 1 tablet (25 mg total) by mouth daily.  . simvastatin (ZOCOR) 10 MG tablet TAKE ONE TABLET BY MOUTH EVERY EVENING AT BEDTIME  . [DISCONTINUED] amLODipine (NORVASC) 5 MG tablet Take 1 tablet (5 mg total) by mouth 2 (two) times daily as needed.  . [DISCONTINUED] brimonidine (ALPHAGAN P) 0.1 % SOLN Place 1 drop into the left eye 2 (two) times daily.  . [DISCONTINUED] dorzolamide-timolol (COSOPT) 22.3-6.8 MG/ML ophthalmic solution Place 1 drop into the left eye 2 (two) times daily.   . [DISCONTINUED] omeprazole (PRILOSEC) 20 MG capsule TAKE 1 CAPSULE EVERY DAY   No facility-administered encounter medications on file as of 05/01/2017.     Activities of Daily Living In your present state of health, do you have any difficulty performing the following activities: 05/01/2017 01/30/2017  Hearing? Tempie Donning  Vision? Y N  Difficulty concentrating or making decisions? N N  Walking or climbing stairs? Y Y  Dressing or bathing? N N  Doing errands, shopping? N -  Preparing Food and eating ? N -  Using the Toilet? N -  In the past six months, have you accidently leaked urine? Y -  Do you have problems with loss of bowel control? N -  Managing your Medications? N -  Managing your Finances? Y -  Housekeeping or managing your Housekeeping? N -  Some recent data might be hidden    Patient Care Team: Einar Pheasant, MD as PCP - General (Unknown Physician Specialty) Minna Merritts, MD as Consulting Physician (Cardiology)    Assessment:    This is a routine wellness examination for Santita. The goal of the wellness visit is to assist the patient how to close the gaps in care and create a preventative care plan for the patient.   Taking calcium VIT D as appropriate/Osteoporosis reviewed.  Bone density scan deferred per patient request.  Medications reviewed;  taking without issues or barriers.  Recently moved from her home into [facility] (1 week ago).  Sad at times about the loss of her husband last August.  She has a local support system of friends, and family who reside in a different state.  She plans to get involved in activities offered on site to increase her physical/social activity.   Safety issues reviewed; smoke detectors in the home. No firearms in the home.  Wears seatbelts when driving or riding with others. Patient does wear sunscreen or protective clothing when in direct sunlight. No violence in the home.  Patient is alert, normal appearance, oriented to person/place/and time. Correctly identified  the president of the [place], recall of 2/3 words, and performing simple calculations.  Patient displays appropriate judgement and can read correct time from watch face.  No new identified risk were noted.  No failures at ADL's or IADL's. Cane/walker in use when ambulating.  BMI- discussed the importance of a healthy diet, water intake and exercise. Educational material provided.   Diet: She eats 2 out of 3 daily meals prepared by [facility] staff.  Daily fluid intake:  She has limited her fluid/water intake due to urinary urgency.  States she only takes sips and just enough to take her medicine. Encouraged to increase water intake to prevent dehydration  HTN- followed by PCP.  Dental- every six months.  Eye- Visual acuity not assessed per patient preference since they have regular follow up with the ophthalmologist.  Wears corrective lenses.  Sleep patterns- Sleeps 6-7 hours at night.  Wakes feeling rested.  Naps during the day as needed.  Prevnar13 and TDAP vaccine deferred per patient preference.    Follow up with insurance.  Educational material provided.  Patient Concerns: None at this time. Follow up with PCP as needed.  Exercise Activities and Dietary recommendations Current Exercise Habits: The patient does  not participate in regular exercise at present  Goals    . Healthy Lifestyle          Stay active  Stay hydrated  Low carb foods       Fall Risk Fall Risk  05/01/2017 10/24/2016 05/10/2016 10/27/2014 04/24/2014  Falls in the past year? No No No No No  Risk for fall due to : - - - - Impaired mobility   Depression Screen PHQ 2/9 Scores 05/01/2017 10/24/2016 05/10/2016 10/27/2014  PHQ - 2 Score 1 0 0 0     Cognitive Function     6CIT Screen 05/01/2017  What Year? 0 points  What month? 0 points  What time? 0 points  Count back from 20 2 points  Months in reverse 0 points  Repeat phrase 2 points  Total Score 4    Immunization History  Administered Date(s) Administered  . Influenza Split 09/18/2012, 08/19/2013, 08/27/2014  . Influenza-Unspecified 09/23/2015, 08/29/2016  . Pneumococcal Conjugate-13 04/24/2014   Screening Tests Health Maintenance  Topic Date Due  . TETANUS/TDAP  02/16/1948  . DEXA SCAN  02/15/1994  . PNA vac Low Risk Adult (2 of 2 - PPSV23) 04/25/2015  . MAMMOGRAM  07/20/2018 (Originally 10/30/2014)  . INFLUENZA VACCINE  06/28/2017      Plan:    End of life planning; Advance aging; Advanced directives discussed. Copy of current HCPOA/Living Will requested.    I have personally reviewed and noted the following in the patient's chart:   . Medical and social history . Use of alcohol, tobacco or illicit drugs  . Current medications and supplements . Functional ability and status . Nutritional status . Physical activity . Advanced directives . List of other physicians . Hospitalizations, surgeries, and ER visits in previous 12 months . Vitals . Screenings to include cognitive, depression, and falls . Referrals and appointments  In addition, I have reviewed and discussed with patient certain preventive protocols, quality metrics, and best practice recommendations. A written personalized care plan for preventive services as well as general preventive  health recommendations were provided to patient.     Varney Biles, LPN  04/30/8936   Reviewed above information.  Agree with plan.  Dr Nicki Reaper

## 2017-06-06 ENCOUNTER — Encounter: Payer: Self-pay | Admitting: Internal Medicine

## 2017-06-06 ENCOUNTER — Ambulatory Visit (INDEPENDENT_AMBULATORY_CARE_PROVIDER_SITE_OTHER): Payer: Medicare Other | Admitting: Internal Medicine

## 2017-06-06 DIAGNOSIS — R441 Visual hallucinations: Secondary | ICD-10-CM

## 2017-06-06 DIAGNOSIS — Z8542 Personal history of malignant neoplasm of other parts of uterus: Secondary | ICD-10-CM

## 2017-06-06 DIAGNOSIS — Z23 Encounter for immunization: Secondary | ICD-10-CM | POA: Diagnosis not present

## 2017-06-06 DIAGNOSIS — K2100 Gastro-esophageal reflux disease with esophagitis, without bleeding: Secondary | ICD-10-CM

## 2017-06-06 DIAGNOSIS — E7800 Pure hypercholesterolemia, unspecified: Secondary | ICD-10-CM

## 2017-06-06 DIAGNOSIS — N183 Chronic kidney disease, stage 3 unspecified: Secondary | ICD-10-CM

## 2017-06-06 DIAGNOSIS — I10 Essential (primary) hypertension: Secondary | ICD-10-CM | POA: Diagnosis not present

## 2017-06-06 DIAGNOSIS — F32A Depression, unspecified: Secondary | ICD-10-CM

## 2017-06-06 DIAGNOSIS — G8929 Other chronic pain: Secondary | ICD-10-CM | POA: Diagnosis not present

## 2017-06-06 DIAGNOSIS — F329 Major depressive disorder, single episode, unspecified: Secondary | ICD-10-CM

## 2017-06-06 DIAGNOSIS — M545 Low back pain: Secondary | ICD-10-CM

## 2017-06-06 DIAGNOSIS — R609 Edema, unspecified: Secondary | ICD-10-CM

## 2017-06-06 MED ORDER — PANTOPRAZOLE SODIUM 40 MG PO TBEC: 40.0000 mg | DELAYED_RELEASE_TABLET | Freq: Every day | ORAL | 3 refills | Status: DC

## 2017-06-06 NOTE — Progress Notes (Signed)
Pre-visit discussion using our clinic review tool. No additional management support is needed unless otherwise documented below in the visit note.  

## 2017-06-06 NOTE — Progress Notes (Signed)
[name], female   DOB: 03-01-1927, 83 y.o.   MRN: [id number]   Subjective:    [name], female    DOB: 05-17-1927, 83 y.o.   MRN: [id number]  HPI  Patient here for a scheduled follow up.  Increased stress since her husband passed away.  Children live [place] of state.  She is trying to decide if she is going to move close to them.  She is eating.  No nausea or vomiting.  Previously changed from PPI to zantac.  She does not feel this controls her symptoms.  Has acid reflux since starting.  Previously controlled on PPI.  No chest pain.  No sob.  No abdominal pain.  Saw GI 01/2017.  Hemoccult cards negative.  Instructed to take miralax.  She is followed by nephrology.  On hydralazine.  He had instructed her to take 1/2 tablet tid.  She felt her blood pressure was running a little high, so she started taking 1/2 at breakfast.  Sometimes skips lunch and will take 100mg  in the evening.  States blood pressure has been averaging <150.  Left ankle swelling.  Has had some chronic swelling in this foot and ankle.  Varies at times.  Some increased swelling recently.  Does not extend up the leg.  No injury.  No increased pain.     Past Medical History:  Diagnosis Date  . Sherran Needs syndrome   . Diastolic CHF, acute (HCC)    Echo (8/10) with EF 60-65%, mild LVH, mild to moderate TR, PASP 40 mmHg.  . Dizziness    Thought to be side effect of Norvasc  . GERD (gastroesophageal reflux disease)   . Glaucoma   . HTN (hypertension)   . Hypercalcemia    On HCTZ  . Hypercholesterolemia   . Hyperkalemia   . Macular degeneration   . Osteoporosis   . Renal fibromuscular dysplasia ([place])   . Renal insufficiency   . S/P cardiac catheterization 05/2002   Luminal irregularities only in the coronaries. EF 55%. There was some irregulatrity in the right renal artery suggestive of possible fibromuscular dysplasia. Myoview in 5/08 showed no ischemia or infarction. Lexiscan myoview at  [facility] (10/10) showed EF 55%, normal wall motion, no evience ofor ischemia or infarction.  . Scoliosis associated with other condition    Chronic back pain  . Uterine cancer ([place])    S/P hypsterectomy in 2008  . Wears hearing aid    bilateral   Past Surgical History:  Procedure Laterality Date  . AQUEOUS SHUNT Left 01/30/2017   Procedure: AQUEOUS SHUNT ahmed tube;  Surgeon: Ronnell Freshwater, MD;  Location: [place];  Service: Ophthalmology;  Laterality: Left;  ahmed tube shunt with scleral patch graft  . CARDIAC CATHETERIZATION     Left  . CATARACT EXTRACTION Right 07/03/2013   [facility] Dr Wallace Going  . CATARACT EXTRACTION W/PHACO Left 01/30/2017   Procedure: CATARACT EXTRACTION PHACO AND INTRAOCULAR LENS PLACEMENT ([place])  left;  Surgeon: Ronnell Freshwater, MD;  Location: [place];  Service: Ophthalmology;  Laterality: Left;  . REFRACTIVE SURGERY    . TOTAL ABDOMINAL HYSTERECTOMY W/ BILATERAL SALPINGOOPHORECTOMY     stage I C well differentiated adenocarcinoma   Family History  Problem Relation Age of Onset  . Heart disease Father   . Heart disease Mother   . Parkinson's disease Mother   . Diabetes Cousin    Social History   Social History  . Marital status:  Widowed    Spouse name: N/A  . Number of children: 2  . Years of education: N/A   Social History Main Topics  . Smoking status: Never Smoker  . Smokeless tobacco: Never Used  . Alcohol use No  . Drug use: No  . Sexual activity: Not Asked   Other Topics Concern  . None   Social History Narrative   Lives with husband in [place]    Outpatient Encounter Prescriptions as of 06/06/2017  Medication Sig  . amLODipine (NORVASC) 5 MG tablet Take 1 tablet (5 mg total) by mouth 2 (two) times daily as needed.  Marland Kitchen aspirin 81 MG EC tablet Take 81 mg by mouth daily.    . B Complex Vitamins (PA B-COMPLEX WITH B-12) TABS Take 1 tablet by mouth daily.    . Calcium Carbonate-Vitamin D  (CALCIUM-VITAMIN D) 600-200 MG-UNIT CAPS Take by mouth. Takes 1/2 tablet daily.  . furosemide (LASIX) 40 MG tablet Take 1 tablet (40 mg total) by mouth daily.  . Glucosamine-Chondroitin 1500-1200 MG/30ML LIQD Take by mouth. Takes 1/2 tablet daily.  . hydrALAZINE (APRESOLINE) 100 MG tablet Take 1 tablet (100 mg total) by mouth 4 (four) times daily.  Marland Kitchen latanoprost (XALATAN) 0.005 % ophthalmic solution Place 1 drop into the left eye at bedtime.   Marland Kitchen losartan (COZAAR) 100 MG tablet Take 1 tablet (100 mg total) by mouth daily.  . Magnesium 250 MG TABS Take 250 mg by mouth daily.  . multivitamin (THERAGRAN) per tablet Take 1 tablet by mouth daily.    Marland Kitchen nystatin cream (MYCOSTATIN) Apply 1 application topically 2 (two) times daily.  . Polyethylene Glycol 3350 (MIRALAX PO) Take by mouth daily.   . ranitidine (ZANTAC) 150 MG tablet TAKE ONE TABLET BY MOUTH TWICE DAILY 10 TO 15 MIN BEFORE MEAL  . sertraline (ZOLOFT) 25 MG tablet Take 1 tablet (25 mg total) by mouth daily.  . simvastatin (ZOCOR) 10 MG tablet TAKE ONE TABLET BY MOUTH EVERY EVENING AT BEDTIME  . [DISCONTINUED] ranitidine (ZANTAC) 75 MG tablet Take 75 mg by mouth at bedtime.  . pantoprazole (PROTONIX) 40 MG tablet Take 1 tablet (40 mg total) by mouth daily.  . [DISCONTINUED] pantoprazole (PROTONIX) 40 MG tablet Take 1 tablet (40 mg total) by mouth daily.   No facility-administered encounter medications on file as of 06/06/2017.     Review of Systems  Constitutional: Negative for appetite change and unexpected weight change.  HENT: Negative for congestion and sinus pressure.   Respiratory: Negative for cough, chest tightness and shortness of breath.   Cardiovascular: Negative for chest pain and palpitations.       Left ankle swelling as outlined.    Gastrointestinal: Negative for abdominal pain, diarrhea, nausea and vomiting.       Acid reflux as outlined.   Genitourinary: Negative for difficulty urinating and dysuria.  Musculoskeletal:  Positive for back pain. Negative for joint swelling.  Skin: Negative for color change and rash.  Neurological: Negative for dizziness, light-headedness and headaches.  Psychiatric/Behavioral: Negative for agitation.       Increased stress as outlined.         Objective:     Blood pressure rechecked by me:  136/64  Physical Exam  Constitutional: She appears well-developed and well-nourished. No distress.  HENT:  Nose: Nose normal.  Mouth/Throat: Oropharynx is clear and moist.  Neck: Neck supple. No thyromegaly present.  Cardiovascular: Normal rate and regular rhythm.   Pulmonary/Chest: Breath sounds normal. No respiratory distress. She  has no wheezes.  Abdominal: Soft. Bowel sounds are normal. There is no tenderness.  Musculoskeletal: She exhibits no tenderness.  Left pedal and ankle edema.  No increased erythema.  Does not extend up the leg.    Lymphadenopathy:    She has no cervical adenopathy.  Skin: No rash noted. No erythema.  Psychiatric: She has a normal mood and affect. Her behavior is normal.    BP 133/80 (BP Location: Left Arm, Patient Position: Sitting, Cuff Size: Normal)   Pulse 87   Temp (!) 97.5 F (36.4 C) (Oral)   Resp 12   Ht 5' (1.524 m)   Wt 130 lb (59 kg)   SpO2 97%   BMI 25.39 kg/m  Wt Readings from Last 3 Encounters:  06/06/17 130 lb (59 kg)  05/01/17 130 lb (59 kg)  03/23/17 129 lb 9.6 oz (58.8 kg)     Lab Results  Component Value Date   WBC 9.4 05/25/2016   HGB 12.7 05/25/2016   HCT 38.5 05/25/2016   PLT 204.0 05/25/2016   GLUCOSE 108 (H) 03/20/2017   CHOL 171 03/20/2017   TRIG 95.0 03/20/2017   HDL 99.00 03/20/2017   LDLCALC 53 03/20/2017   ALT 20 03/20/2017   AST 26 03/20/2017   NA 138 03/20/2017   K 4.3 03/20/2017   CL 106 03/20/2017   CREATININE 1.72 (H) 03/20/2017   BUN 50 (H) 03/20/2017   CO2 23 03/20/2017   TSH 3.94 05/25/2016   HGBA1C 5.8 03/20/2017       Assessment & Plan:   Problem List Items Addressed This Visit     Chronic back pain    Seeing Dr Sharlet Salina.        Chronic kidney disease (CKD), stage III (moderate)    Followed by nephrology.  Avoid antiinflammatories.        Depression    Stable on current regimen.  Discussed with her today.  States she wants to be closer to her children.  Considering moving.  Follow.        EDEMA    Left ankle swelling as outlined.  Has had chronic swelling.  Worse at times.  Compression hose.  Follow.        GERD (gastroesophageal reflux disease)    Trial of zantac.  Increased acid reflux on zantac.  Start protonix.  Follow.  Notify me if persistent problems.        Relevant Medications   pantoprazole (PROTONIX) 40 MG tablet   History of endometrial cancer    Has been followed by gyn.  Stable.        Hypercholesterolemia    On simvastatin.  Follow lipid panel and liver function tests.        Hypertension    Blood pressure as outlined.  Overall improved.  Follow.       Visual hallucinations    Visual hallucinations persists.  Has appt with neurology.         Other Visit Diagnoses    Need for 23-polyvalent pneumococcal polysaccharide vaccine       Relevant Orders   Pneumococcal polysaccharide vaccine 23-valent greater than or equal to 2yo subcutaneous/IM (Completed)       Einar Pheasant, MD

## 2017-06-06 NOTE — Patient Instructions (Signed)
Take protonix 40mg  30 minutes before breakfast  Take zantac 30 minutes before your evening meal.

## 2017-06-07 ENCOUNTER — Encounter: Payer: Self-pay | Admitting: Internal Medicine

## 2017-06-07 NOTE — Assessment & Plan Note (Signed)
Followed by nephrology.  Avoid antiinflammatories.

## 2017-06-07 NOTE — Assessment & Plan Note (Signed)
Trial of zantac.  Increased acid reflux on zantac.  Start protonix.  Follow.  Notify me if persistent problems.

## 2017-06-07 NOTE — Assessment & Plan Note (Signed)
On simvastatin.  Follow lipid panel and liver function tests.   

## 2017-06-07 NOTE — Assessment & Plan Note (Signed)
Blood pressure as outlined.  Overall improved.  Follow.

## 2017-06-07 NOTE — Assessment & Plan Note (Signed)
Visual hallucinations persists.  Has appt with neurology.

## 2017-06-07 NOTE — Assessment & Plan Note (Signed)
Seeing Dr Chasnis.   

## 2017-06-07 NOTE — Assessment & Plan Note (Signed)
Left ankle swelling as outlined.  Has had chronic swelling.  Worse at times.  Compression hose.  Follow.

## 2017-06-07 NOTE — Assessment & Plan Note (Signed)
Has been followed by gyn.  Stable.

## 2017-06-07 NOTE — Assessment & Plan Note (Signed)
Stable on current regimen.  Discussed with her today.  States she wants to be closer to her children.  Considering moving.  Follow.

## 2017-06-08 ENCOUNTER — Other Ambulatory Visit: Payer: Self-pay | Admitting: Internal Medicine

## 2017-06-09 ENCOUNTER — Other Ambulatory Visit: Payer: Self-pay | Admitting: Cardiovascular Disease

## 2017-07-10 NOTE — Telephone Encounter (Signed)
This encounter was created in error - please disregard.

## 2017-07-12 ENCOUNTER — Other Ambulatory Visit: Payer: Self-pay | Admitting: Cardiovascular Disease

## 2017-07-25 ENCOUNTER — Telehealth: Payer: Self-pay | Admitting: Cardiovascular Disease

## 2017-07-25 NOTE — Telephone Encounter (Signed)
Pt calling stating her kidney doctor changed up her Hydralazine  She states they cut it in half.  She woke up last night about 1 am  She states she felt it was high and it really worried her 179/103 HR 91  She states she didn't sleep much After she woke up 5:30 am it was 177/102 HR 82   Please call back she is confused as to what she needs to be taking

## 2017-07-25 NOTE — Telephone Encounter (Signed)
Left voicemail message for patient to call back.

## 2017-07-26 NOTE — Telephone Encounter (Signed)
Left voicemail message to call back  

## 2017-07-27 NOTE — Telephone Encounter (Signed)
Patient returning our call and she states that her blood pressures are much better today. She states that she feels her anxiety made it higher yesterday. She reports that today it was 139/73 with heart rate of 63. Instructed her to continue monitoring her blood pressures and that we do not want it to run 140/80 or higher. She reports that the kidney physician decreased her medication and she was concerned. She reports that she is still taking the higher dose. Instructed her to please call her kidney specialist and make him aware of her concerns but that she should continue monitoring as well. She verbalized understanding and had no further concerns or questions at this time.

## 2017-07-27 NOTE — Telephone Encounter (Signed)
Left voicemail message to call back  

## 2017-08-12 ENCOUNTER — Emergency Department: Payer: Medicare Other

## 2017-08-12 ENCOUNTER — Observation Stay
Admission: EM | Admit: 2017-08-12 | Discharge: 2017-08-13 | Disposition: A | Discharge status: Home / Self Care | Payer: Medicare Other | Attending: Specialist | Admitting: Specialist

## 2017-08-12 DIAGNOSIS — Z8249 Family history of ischemic heart disease and other diseases of the circulatory system: Secondary | ICD-10-CM | POA: Diagnosis not present

## 2017-08-12 DIAGNOSIS — K219 Gastro-esophageal reflux disease without esophagitis: Secondary | ICD-10-CM | POA: Insufficient documentation

## 2017-08-12 DIAGNOSIS — Z8542 Personal history of malignant neoplasm of other parts of uterus: Secondary | ICD-10-CM | POA: Insufficient documentation

## 2017-08-12 DIAGNOSIS — I10 Essential (primary) hypertension: Secondary | ICD-10-CM

## 2017-08-12 DIAGNOSIS — I5032 Chronic diastolic (congestive) heart failure: Secondary | ICD-10-CM | POA: Insufficient documentation

## 2017-08-12 DIAGNOSIS — M810 Age-related osteoporosis without current pathological fracture: Secondary | ICD-10-CM | POA: Insufficient documentation

## 2017-08-12 DIAGNOSIS — Z888 Allergy status to other drugs, medicaments and biological substances status: Secondary | ICD-10-CM | POA: Diagnosis not present

## 2017-08-12 DIAGNOSIS — N183 Chronic kidney disease, stage 3 (moderate): Secondary | ICD-10-CM | POA: Insufficient documentation

## 2017-08-12 DIAGNOSIS — F419 Anxiety disorder, unspecified: Secondary | ICD-10-CM | POA: Diagnosis not present

## 2017-08-12 DIAGNOSIS — Q614 Renal dysplasia: Secondary | ICD-10-CM | POA: Diagnosis not present

## 2017-08-12 DIAGNOSIS — Z79899 Other long term (current) drug therapy: Secondary | ICD-10-CM | POA: Diagnosis not present

## 2017-08-12 DIAGNOSIS — Z880 Allergy status to penicillin: Secondary | ICD-10-CM | POA: Diagnosis not present

## 2017-08-12 DIAGNOSIS — M419 Scoliosis, unspecified: Secondary | ICD-10-CM | POA: Insufficient documentation

## 2017-08-12 DIAGNOSIS — M7989 Other specified soft tissue disorders: Secondary | ICD-10-CM | POA: Insufficient documentation

## 2017-08-12 DIAGNOSIS — R079 Chest pain, unspecified: Secondary | ICD-10-CM | POA: Diagnosis present

## 2017-08-12 DIAGNOSIS — I130 Hypertensive heart and chronic kidney disease with heart failure and stage 1 through stage 4 chronic kidney disease, or unspecified chronic kidney disease: Secondary | ICD-10-CM | POA: Diagnosis not present

## 2017-08-12 DIAGNOSIS — E785 Hyperlipidemia, unspecified: Secondary | ICD-10-CM | POA: Insufficient documentation

## 2017-08-12 DIAGNOSIS — E7800 Pure hypercholesterolemia, unspecified: Secondary | ICD-10-CM | POA: Diagnosis not present

## 2017-08-12 DIAGNOSIS — R0789 Other chest pain: Secondary | ICD-10-CM | POA: Diagnosis not present

## 2017-08-12 DIAGNOSIS — Z90710 Acquired absence of both cervix and uterus: Secondary | ICD-10-CM | POA: Insufficient documentation

## 2017-08-12 DIAGNOSIS — H409 Unspecified glaucoma: Secondary | ICD-10-CM | POA: Insufficient documentation

## 2017-08-12 DIAGNOSIS — H3530 Unspecified macular degeneration: Secondary | ICD-10-CM | POA: Diagnosis not present

## 2017-08-12 DIAGNOSIS — Z7982 Long term (current) use of aspirin: Secondary | ICD-10-CM | POA: Insufficient documentation

## 2017-08-12 DIAGNOSIS — F329 Major depressive disorder, single episode, unspecified: Secondary | ICD-10-CM | POA: Insufficient documentation

## 2017-08-12 LAB — BASIC METABOLIC PANEL
Anion gap: 7 (ref 5–15)
BUN: 49 mg/dL — ABNORMAL HIGH (ref 6–20)
CHLORIDE: 103 mmol/L (ref 101–111)
CO2: 25 mmol/L (ref 22–32)
CREATININE: 1.48 mg/dL — AB (ref 0.44–1.00)
Calcium: 9.5 mg/dL (ref 8.9–10.3)
GFR calc non Af Amer: 30 mL/min — ABNORMAL LOW (ref >60)
GFR, EST AFRICAN AMERICAN: 35 mL/min — AB (ref >60)
Glucose, Bld: 124 mg/dL — ABNORMAL HIGH (ref 65–99)
Potassium: 4.4 mmol/L (ref 3.5–5.1)
Sodium: 135 mmol/L (ref 135–145)

## 2017-08-12 LAB — HEMOGLOBIN A1C
Hgb A1c MFr Bld: 5.6 % (ref 4.8–5.6)
Mean Plasma Glucose: 114.02 mg/dL

## 2017-08-12 LAB — CBC
HCT: 37.7 % (ref 35.0–47.0)
HEMOGLOBIN: 12.9 g/dL (ref 12.0–16.0)
MCH: 32.9 pg (ref 26.0–34.0)
MCHC: 34.2 g/dL (ref 32.0–36.0)
MCV: 96.2 fL (ref 80.0–100.0)
PLATELETS: 155 10*3/uL (ref 150–440)
RBC: 3.92 MIL/uL (ref 3.80–5.20)
RDW: 13.1 % (ref 11.5–14.5)
WBC: 6.1 10*3/uL (ref 3.6–11.0)

## 2017-08-12 LAB — TROPONIN I
TROPONIN I: 0.03 ng/mL — AB (ref <0.03)
TROPONIN I: 0.04 ng/mL — AB (ref <0.03)
Troponin I: <0.03 ng/mL (ref <0.03)
Troponin I: 0.03 ng/mL (ref <0.03)

## 2017-08-12 MED ORDER — ASPIRIN 81 MG PO CHEW
324.0000 mg | CHEWABLE_TABLET | Freq: Once | ORAL | Status: AC
  Administered 2017-08-12: 324 mg via ORAL
  Filled 2017-08-12: qty 4

## 2017-08-12 MED ORDER — PANTOPRAZOLE SODIUM 40 MG PO TBEC
40.0000 mg | DELAYED_RELEASE_TABLET | Freq: Every evening | ORAL | Status: DC
  Administered 2017-08-12: 40 mg via ORAL
  Filled 2017-08-12: qty 1

## 2017-08-12 MED ORDER — HEPARIN SODIUM (PORCINE) 5000 UNIT/ML IJ SOLN
5000.0000 [IU] | Freq: Three times a day (TID) | INTRAMUSCULAR | Status: DC
  Filled 2017-08-12: qty 1

## 2017-08-12 MED ORDER — HYDRALAZINE HCL 50 MG PO TABS
100.0000 mg | ORAL_TABLET | Freq: Four times a day (QID) | ORAL | Status: DC
  Administered 2017-08-12 – 2017-08-13 (×3): 100 mg via ORAL
  Filled 2017-08-12 (×3): qty 2

## 2017-08-12 MED ORDER — DOCUSATE SODIUM 100 MG PO CAPS: 100.0000 mg | ORAL_CAPSULE | Freq: Two times a day (BID) | ORAL | Status: DC | PRN

## 2017-08-12 MED ORDER — SODIUM CHLORIDE 0.9% FLUSH
3.0000 mL | Freq: Two times a day (BID) | INTRAVENOUS | Status: DC
  Administered 2017-08-12 – 2017-08-13 (×2): 3 mL via INTRAVENOUS

## 2017-08-12 MED ORDER — SIMVASTATIN 20 MG PO TABS
10.0000 mg | ORAL_TABLET | Freq: Every day | ORAL | Status: DC
  Administered 2017-08-12: 10 mg via ORAL
  Filled 2017-08-12: qty 1

## 2017-08-12 MED ORDER — LOSARTAN POTASSIUM 50 MG PO TABS
100.0000 mg | ORAL_TABLET | Freq: Every day | ORAL | Status: DC
  Administered 2017-08-13: 100 mg via ORAL
  Filled 2017-08-12: qty 2

## 2017-08-12 MED ORDER — ACETAMINOPHEN 325 MG PO TABS
650.0000 mg | ORAL_TABLET | Freq: Four times a day (QID) | ORAL | Status: DC | PRN
  Administered 2017-08-12 – 2017-08-13 (×2): 650 mg via ORAL
  Filled 2017-08-12 (×2): qty 2

## 2017-08-12 MED ORDER — SERTRALINE HCL 50 MG PO TABS
25.0000 mg | ORAL_TABLET | Freq: Every day | ORAL | Status: DC
  Filled 2017-08-12 (×2): qty 1

## 2017-08-12 MED ORDER — NITROGLYCERIN 0.4 MG SL SUBL
0.4000 mg | SUBLINGUAL_TABLET | SUBLINGUAL | Status: DC | PRN
  Administered 2017-08-12: 0.4 mg via SUBLINGUAL
  Filled 2017-08-12: qty 1

## 2017-08-12 MED ORDER — ASPIRIN EC 81 MG PO TBEC
81.0000 mg | DELAYED_RELEASE_TABLET | Freq: Every day | ORAL | Status: DC
  Administered 2017-08-12 – 2017-08-13 (×2): 81 mg via ORAL
  Filled 2017-08-12 (×2): qty 1

## 2017-08-12 MED ORDER — LATANOPROST 0.005 % OP SOLN
1.0000 [drp] | Freq: Every day | OPHTHALMIC | Status: DC
  Administered 2017-08-12: 1 [drp] via OPHTHALMIC
  Filled 2017-08-12: qty 2.5

## 2017-08-12 MED ORDER — AMLODIPINE BESYLATE 5 MG PO TABS
5.0000 mg | ORAL_TABLET | Freq: Every day | ORAL | Status: DC
  Administered 2017-08-12 – 2017-08-13 (×2): 5 mg via ORAL
  Filled 2017-08-12 (×2): qty 1

## 2017-08-12 NOTE — Progress Notes (Signed)
Notified Dr. Ara Kussmaul of patient c/o pain/headache. Tylenol ordered.

## 2017-08-12 NOTE — ED Provider Notes (Signed)
[facility] Emergency Department Provider Note   ____________________________________________   First MD Initiated Contact with Patient 08/12/17 1104     (approximate)  I have reviewed the triage vital signs and the nursing notes.   HISTORY  Chief Complaint Chest Pain    HPI CARLOYN Castro is a 83 y.o. female stents for evaluation of chest pain  History of congestive heart failure. 10 AM this morning, patient reports sudden severe pain across her entire chest. It ran from both sides. The pain subsided, and is now gone. She took her home blood pressure medicine reports relief. No nausea or vomiting. Has not had a stress test for several years. Has an appointment with cardiology in the upcoming week  Has had previous stress tests in the past.  no fevers or chills. No trouble breathing.  Currently rates pain as 0, but was described as severe and lasting about 30 minutes to an hour.   Past Medical History:  Diagnosis Date  . Sherran Needs syndrome   . Diastolic CHF, acute (HCC)    Echo (8/10) with EF 60-65%, mild LVH, mild to moderate TR, PASP 40 mmHg.  . Dizziness    Thought to be side effect of Norvasc  . GERD (gastroesophageal reflux disease)   . Glaucoma   . HTN (hypertension)   . Hypercalcemia    On HCTZ  . Hypercholesterolemia   . Hyperkalemia   . Macular degeneration   . Osteoporosis   . Renal fibromuscular dysplasia ([place])   . Renal insufficiency   . S/P cardiac catheterization 05/2002   Luminal irregularities only in the coronaries. EF 55%. There was some irregulatrity in the right renal artery suggestive of possible fibromuscular dysplasia. Myoview in 5/08 showed no ischemia or infarction. Lexiscan myoview at [facility] (10/10) showed EF 55%, normal wall motion, no evience ofor ischemia or infarction.  . Scoliosis associated with other condition    Chronic back pain  . Uterine cancer ([place])    S/P hypsterectomy in 2008  . Wears  hearing aid    bilateral    Patient Active Problem List   Diagnosis Date Noted  . Depression 01/29/2017  . Visual hallucinations 07/21/2016  . Loss of weight 05/12/2016  . Facial lesion 08/30/2015  . Health care maintenance 04/22/2015  . DOE (dyspnea on exertion) 02/03/2015  . Drainage from nose 10/27/2014  . GERD (gastroesophageal reflux disease) 03/15/2013  . Right shoulder pain 11/30/2012  . Chronic kidney disease (CKD), stage III (moderate) 10/18/2012  . Hyperkalemia 10/18/2012  . Osteoporosis 10/18/2012  . History of endometrial cancer 10/18/2012  . Chronic back pain 10/18/2012  . Hypertension 10/16/2012  . EDEMA 10/30/2009  . Hypercholesterolemia 09/22/2009  . Chest pain 09/08/2009  . DIASTOLIC HEART FAILURE, CHRONIC 07/07/2009  . Shortness of breath 07/07/2009    Past Surgical History:  Procedure Laterality Date  . AQUEOUS SHUNT Left 01/30/2017   Procedure: AQUEOUS SHUNT ahmed tube;  Surgeon: Ronnell Freshwater, MD;  Location: [place];  Service: Ophthalmology;  Laterality: Left;  ahmed tube shunt with scleral patch graft  . CARDIAC CATHETERIZATION     Left  . CATARACT EXTRACTION Right 07/03/2013   [facility] Dr Wallace Going  . CATARACT EXTRACTION W/PHACO Left 01/30/2017   Procedure: CATARACT EXTRACTION PHACO AND INTRAOCULAR LENS PLACEMENT ([place])  left;  Surgeon: Ronnell Freshwater, MD;  Location: [place];  Service: Ophthalmology;  Laterality: Left;  . REFRACTIVE SURGERY    . TOTAL ABDOMINAL HYSTERECTOMY W/ BILATERAL SALPINGOOPHORECTOMY  stage I C well differentiated adenocarcinoma    Prior to Admission medications   Medication Sig Start Date End Date Taking? Authorizing Provider  amLODipine (NORVASC) 5 MG tablet Take 1 tablet (5 mg total) by mouth 2 (two) times daily as needed. 03/08/17   Minna Merritts, MD  aspirin 81 MG EC tablet Take 81 mg by mouth daily.      [provider]  B Complex Vitamins (PA B-COMPLEX WITH B-12)  TABS Take 1 tablet by mouth daily.      [provider]  Calcium Carbonate-Vitamin D (CALCIUM-VITAMIN D) 600-200 MG-UNIT CAPS Take by mouth. Takes 1/2 tablet daily.    [provider]  furosemide (LASIX) 40 MG tablet Take 1 tablet (40 mg total) by mouth daily. 11/09/16   Rise Mu, PA-C  Glucosamine-Chondroitin 1500-1200 MG/30ML LIQD Take by mouth. Takes 1/2 tablet daily.    [provider]  hydrALAZINE (APRESOLINE) 100 MG tablet Take 1 tablet (100 mg total) by mouth 4 (four) times daily. 12/26/16   Minna Merritts, MD  latanoprost (XALATAN) 0.005 % ophthalmic solution Place 1 drop into the left eye at bedtime.  10/18/12   [provider]  losartan (COZAAR) 100 MG tablet TAKE 1 TABLET BY MOUTH DAILY 07/12/17   Minna Merritts, MD  Magnesium 250 MG TABS Take 250 mg by mouth daily.    [provider]  multivitamin [facility]) per tablet Take 1 tablet by mouth daily.      [provider]  nystatin cream (MYCOSTATIN) Apply 1 application topically 2 (two) times daily. 01/26/17   Einar Pheasant, MD  pantoprazole (PROTONIX) 40 MG tablet Take 1 tablet (40 mg total) by mouth daily. 06/06/17   Einar Pheasant, MD  Polyethylene Glycol 3350 (MIRALAX PO) Take by mouth daily.     [provider]  ranitidine (ZANTAC) 150 MG tablet TAKE ONE TABLET BY MOUTH TWICE DAILY 10 TO 15 MIN BEFORE MEAL 05/01/17   [provider]  sertraline (ZOLOFT) 25 MG tablet Take 1 tablet (25 mg total) by mouth daily. 01/26/17   Einar Pheasant, MD  simvastatin (ZOCOR) 10 MG tablet TAKE ONE TABLET AT BEDTIME 06/08/17   Einar Pheasant, MD    Allergies Clonidine; Benicar [olmesartan]; Celebrex [celecoxib]; Iodine; and Penicillins  Family History  Problem Relation Age of Onset  . Heart disease Father   . Heart disease Mother   . Parkinson's disease Mother   . Diabetes Cousin     Social History Social History  Substance Use Topics  . Smoking status: Never  Smoker  . Smokeless tobacco: Never Used  . Alcohol use No    Review of Systems Constitutional: No fever/chills Eyes: No visual changes. ENT: No sore throat. Cardiovascular: see history of present illness Respiratory: Denies shortness of breath. Gastrointestinal: No abdominal pain.  No nausea, no vomiting.  No diarrhea.  No constipation. Genitourinary: Negative for dysuria. Musculoskeletal: Negative for back pain. Skin: Negative for rash.left leg chronically swollen, slightly more so today and over the last several months Neurological: Negative for headaches, focal weakness or numbness.    ____________________________________________   PHYSICAL EXAM:  VITAL SIGNS: ED Triage Vitals  Enc Vitals Group     BP --      Pulse --      Resp --      Temp --      Temp src --      SpO2 --      Weight 08/12/17 1100 130 lb (59  kg)     Height 08/12/17 1100 5\' 4"  (1.626 m)     Head Circumference --      Peak Flow --      Pain Score 08/12/17 1059 1     Pain Loc --      Pain Edu? --      Excl. in [place]? --     Constitutional: Alert and oriented. Well appearing and in no acute distress. Eyes: Conjunctivae are normal. Head: Atraumatic. Nose: No congestion/rhinnorhea. Mouth/Throat: Mucous membranes are moist. Neck: No stridor.   Cardiovascular: Normal rate, regular rhythm. Grossly normal heart sounds.  Good peripheral circulation. Respiratory: Normal respiratory effort.  No retractions. Lungs CTAB. Gastrointestinal: Soft and nontender. No distention. Musculoskeletal: left lower starting about 2-3+ lower extremity edema. No calf tenderness. Right lower extremity minimal edema. Neurologic:  Normal speech and language. No gross focal neurologic deficits are appreciated.  Skin:  Skin is warm, dry and intact. No rash noted. Psychiatric: Mood and affect are normal. Speech and behavior are normal.  ____________________________________________   LABS (all labs ordered are listed, but only  abnormal results are displayed)  Labs Reviewed  BASIC METABOLIC PANEL - Abnormal; Notable for the following:       Result Value   Glucose, Bld 124 (*)    BUN 49 (*)    Creatinine, Ser 1.48 (*)    GFR calc non Af Amer 30 (*)    GFR calc Af Amer 35 (*)    All other components within normal limits  CBC  TROPONIN I   ____________________________________________  EKG  reviewed and interpreted by me at 11:18 AM Ventricular rate 90 QRS 80 QTc 420 Normal sinus rhythm, slight peaked appearance the T waves in V3 and V4. No acute ischemic changes noted ____________________________________________  RADIOLOGY  Dg Chest 2 View  Result Date: 08/12/2017 CLINICAL DATA:  Acute presentation with chest pain. EXAM: CHEST  2 VIEW COMPARISON:  03/12/2015 FINDINGS: Heart size is normal. Ordinary aortic atherosclerosis. Large hiatal hernia. Pulmonary vascularity is normal. Lungs show mild chronic scarring but no evidence of active infiltrate, effusion or collapse. Old thoracic compression deformities with kyphotic curvature. IMPRESSION: No active cardiopulmonary disease. Large hiatal hernia. Thoracic compression fractures with spinal kyphotic curvature. Electronically Signed   By: Nelson Chimes M.D.   On: 08/12/2017 11:57   US Venous Img Lower Unilateral Left  Result Date: 08/12/2017 CLINICAL DATA:  Left lower extremity swelling for the past month. History of varicose veins. History of uterine cancer. Evaluate for DVT. EXAM: LEFT LOWER EXTREMITY VENOUS DOPPLER ULTRASOUND TECHNIQUE: Gray-scale sonography with graded compression, as well as color Doppler and duplex ultrasound were performed to evaluate the lower extremity deep venous systems from the level of the common femoral vein and including the common femoral, femoral, profunda femoral, popliteal and calf veins including the posterior tibial, peroneal and gastrocnemius veins when visible. The superficial great saphenous vein was also interrogated.  Spectral Doppler was utilized to evaluate flow at rest and with distal augmentation maneuvers in the common femoral, femoral and popliteal veins. COMPARISON:  None. FINDINGS: Contralateral Common Femoral Vein: Respiratory phasicity is normal and symmetric with the symptomatic side. No evidence of thrombus. Normal compressibility. Common Femoral Vein: No evidence of thrombus. Normal compressibility, respiratory phasicity and response to augmentation. Saphenofemoral Junction: No evidence of thrombus. Normal compressibility and flow on color Doppler imaging. Profunda Femoral Vein: No evidence of thrombus. Normal compressibility and flow on color Doppler imaging. Femoral Vein: No evidence of thrombus. Normal compressibility,  respiratory phasicity and response to augmentation. Popliteal Vein: No evidence of thrombus. Normal compressibility, respiratory phasicity and response to augmentation. Calf Veins: No evidence of thrombus. Normal compressibility and flow on color Doppler imaging. Superficial Great Saphenous Vein: No evidence of thrombus. Normal compressibility and flow on color Doppler imaging. Venous Reflux:  None. Other Findings: Note is made of a trace amount of subcutaneous edema at the level of the left lower leg and ankle. IMPRESSION: No evidence of DVT within the left lower extremity. Electronically Signed   By: Sandi Mariscal M.D.   On: 08/12/2017 12:52    ____________________________________________   PROCEDURES  Procedure(s) performed: None  Procedures  Critical Care performed: No  ____________________________________________   INITIAL IMPRESSION / ASSESSMENT AND PLAN / ED COURSE  Pertinent labs & imaging results that were available during my care of the patient were reviewed by me and considered in my medical decision making (see chart for details).    chest pain. Patient moderate risk by pretest evaluation. Heart score moderate.  Symptoms atypical of GI, pulmonary emboli,  dissection, etc. stable vital signs. Currently pain-free. Plan to check troponin continue to observe closely.  Clinical Course as of Aug 12 1300  Sat Aug 12, 2017  1244 GFR, Est Non African American: (!) 30 [MQ]  1244 GFR, Est African American: (!) 35 [MQ]    Clinical Course User Index [MQ] Delman Kitten, MD    ----------------------------------------- 1:01 PM on 08/12/2017 -----------------------------------------  Patient resting pain-free. Agreeable with plan for admission for further workup of previous episode of chest pain ____________________________________________   FINAL CLINICAL IMPRESSION(S) / ED DIAGNOSES  Final diagnoses:  Moderate risk chest pain      NEW MEDICATIONS STARTED DURING THIS VISIT:  New Prescriptions   No medications on file     Note:  This document was prepared using Dragon voice recognition software and may include unintentional dictation errors.     Delman Kitten, MD 08/12/17 [phone]

## 2017-08-12 NOTE — ED Notes (Signed)
Pt assisted to the bathroom by this RN. NAD noted at this time. Pt tolerated well.

## 2017-08-12 NOTE — ED Triage Notes (Signed)
Per EMS pt comes from [facility] and after breakfast experienced chest pain.  Pt now says pain is 1/10.  Pt is A&Ox4.

## 2017-08-12 NOTE — Plan of Care (Signed)
Problem: Physical Regulation: Goal: Ability to maintain clinical measurements within normal limits will improve Outcome: Not Progressing Creatinine = 1.5. Will continue to monitor. Wenda Low [facility]

## 2017-08-12 NOTE — Consult Note (Signed)
CARDIOLOGY CONSULT NOTE  [name] MRN: [id number] DOB/AGE: 1927-01-18 83 y.o.  Admit date: 08/12/2017 Primary Physician Einar Pheasant, MD Primary Cardiologist Dr. Rockey Situ Chief Complaint  Chest pain Requesting  Dr. Anselm Jungling  HPI:  The patient sees Dr. Rockey Situ for treatment of diastolic dysfunction, edema and HTN.  She has chest pain with distant cath without obstructive disease.  It looks like the last [employer] 2010.  She says that at her assisted living facility she is able to ambulate with a walker to the dining hall and did this yesterday without any problems.  However, this morning she had mild (2/10) chest pain in the lower chest on the right and left and in the center.  It was uncomfortable but she could not qualify it further.  There were no associated symptoms.  It was at rest and not made worse with movement or deep breathing.  She has not had this pain before except in her back.  She called EMS because her SBP was elevated at 190s.  The patient denies any new symptoms such as neck or arm discomfort. There has been no new shortness of breath, PND or orthopnea. There have been no reported palpitations, presyncope or syncope.  She had no acute EKG changes.  Troponin was marginally above normal.    Past Medical History:  Diagnosis Date  . Sherran Needs syndrome   . Diastolic CHF, acute (HCC)    Echo (8/10) with EF 60-65%, mild LVH, mild to moderate TR, PASP 40 mmHg.  . Dizziness    Thought to be side effect of Norvasc  . GERD (gastroesophageal reflux disease)   . Glaucoma   . HTN (hypertension)   . Hypercalcemia    On HCTZ  . Hypercholesterolemia   . Hyperkalemia   . Macular degeneration   . Osteoporosis   . Renal fibromuscular dysplasia ([place])   . Renal insufficiency   . S/P cardiac catheterization 05/2002   Luminal irregularities only in the coronaries. EF 55%. There was some irregulatrity in the right renal artery suggestive of possible  fibromuscular dysplasia. Myoview in 5/08 showed no ischemia or infarction. Lexiscan myoview at [facility] (10/10) showed EF 55%, normal wall motion, no evience ofor ischemia or infarction.  . Scoliosis associated with other condition    Chronic back pain  . Uterine cancer ([place])    S/P hypsterectomy in 2008  . Wears hearing aid    bilateral    Past Surgical History:  Procedure Laterality Date  . AQUEOUS SHUNT Left 01/30/2017   Procedure: AQUEOUS SHUNT ahmed tube;  Surgeon: Ronnell Freshwater, MD;  Location: [place];  Service: Ophthalmology;  Laterality: Left;  ahmed tube shunt with scleral patch graft  . CARDIAC CATHETERIZATION     Left  . CATARACT EXTRACTION Right 07/03/2013   [facility] Dr Wallace Going  . CATARACT EXTRACTION W/PHACO Left 01/30/2017   Procedure: CATARACT EXTRACTION PHACO AND INTRAOCULAR LENS PLACEMENT ([place])  left;  Surgeon: Ronnell Freshwater, MD;  Location: [place];  Service: Ophthalmology;  Laterality: Left;  . REFRACTIVE SURGERY    . TOTAL ABDOMINAL HYSTERECTOMY W/ BILATERAL SALPINGOOPHORECTOMY     stage I C well differentiated adenocarcinoma    Allergies  Allergen Reactions  . Clonidine   . Benicar [Olmesartan] Other (See Comments)    Lip swelling  . Celebrex [Celecoxib]   . Iodine     Avoids due to decreased kidney function  . Penicillins Rash   Prescriptions Prior to Admission  Medication Sig Dispense Refill Last Dose  . amLODipine (NORVASC) 5 MG tablet Take 1 tablet (5 mg total) by mouth 2 (two) times daily as needed. 60 tablet 3 prn at prn  . aspirin 81 MG EC tablet Take 81 mg by mouth daily.     08/12/2017 at 0415  . B Complex Vitamins (PA B-COMPLEX WITH B-12) TABS Take 1 tablet by mouth daily.     08/11/2017 at 1800  . Calcium Carbonate-Vitamin D (CALCIUM-VITAMIN D) 600-200 MG-UNIT CAPS Take by mouth. Takes 1/2 tablet daily.   08/11/2017 at 1800  . dorzolamide-timolol (COSOPT) 22.3-6.8 MG/ML ophthalmic solution Place 1 drop into both  eyes 2 (two) times daily.   08/12/2017 at 0415  . furosemide (LASIX) 40 MG tablet Take 1 tablet (40 mg total) by mouth daily. 30 tablet 6 prn at prn  . hydrALAZINE (APRESOLINE) 100 MG tablet Take 1 tablet (100 mg total) by mouth 4 (four) times daily. 360 tablet 3 08/12/2017 at 1000  . losartan (COZAAR) 100 MG tablet TAKE 1 TABLET BY MOUTH DAILY 30 tablet 6 08/12/2017 at 0415  . Magnesium 250 MG TABS Take 250 mg by mouth daily.   prn at prn  . Menthol, Topical Analgesic, (BIOFREEZE ROLL-ON EX) Apply 1 application topically daily.   prn at prn  . multivitamin (THERAGRAN) per tablet Take 1 tablet by mouth daily.     08/11/2017 at 1800  . nystatin cream (MYCOSTATIN) Apply 1 application topically 2 (two) times daily. 30 g 0 08/12/2017 at prn  . pantoprazole (PROTONIX) 40 MG tablet Take 1 tablet (40 mg total) by mouth daily. 30 tablet 3 08/11/2017 at 2000  . Polyethylene Glycol 3350 (MIRALAX PO) Take by mouth daily.    prn at prn  . ranitidine (ZANTAC) 150 MG tablet TAKE ONE TABLET BY MOUTH TWICE DAILY 10 TO 15 MIN BEFORE MEAL   08/12/2017 at 0415  . simvastatin (ZOCOR) 10 MG tablet TAKE ONE TABLET AT BEDTIME 90 tablet 0 08/11/2017 at 2000  . Glucosamine-Chondroitin 1500-1200 MG/30ML LIQD Take by mouth. Takes 1/2 tablet daily.   Not Taking at Unknown time  . latanoprost (XALATAN) 0.005 % ophthalmic solution Place 1 drop into the left eye at bedtime.    Not Taking at Unknown time  . sertraline (ZOLOFT) 25 MG tablet Take 1 tablet (25 mg total) by mouth daily. (Patient not taking: Reported on 08/12/2017) 30 tablet 1 Not Taking at Unknown time   Family History  Problem Relation Age of Onset  . Heart disease Father   . Heart disease Mother   . Parkinson's disease Mother   . Diabetes Cousin     Social History   Social History  . Marital status: Widowed    Spouse name: N/A  . Number of children: 2  . Years of education: N/A   Occupational History  . Not on file.   Social History Main Topics  . Smoking  status: Never Smoker  . Smokeless tobacco: Never Used  . Alcohol use No  . Drug use: No  . Sexual activity: Not on file   Other Topics Concern  . Not on file   Social History Narrative   Lives with husband in [place]     ROS:  Constipation, chronic back pain with scoliosis.  Otherwise as stated in the HPI and negative for all other systems.  Physical Exam: Blood pressure (!) 166/96, pulse 80, temperature 97.6 F (36.4 C), temperature source Oral, resp. rate 18, height 5\' 4"  (1.626 m),  weight 130 lb (59 kg), SpO2 98 %.  GENERAL:  Well appearing for her age 11:  Pupils equal round and reactive, fundi not visualized, oral mucosa unremarkable NECK:  No jugular venous distention, waveform within normal limits, carotid upstroke brisk and symmetric, no bruits, no thyromegaly LYMPHATICS:  No cervical, inguinal adenopathy LUNGS:  Clear to auscultation bilaterally BACK:  No CVA tenderness, scoliosis.  CHEST:  Unremarkable HEART:  PMI not displaced or sustained,S1 and S2 within normal limits, no S3, no S4, no clicks, no rubs, soft apical systolic non radiating murmur, no diastolic murmurs ABD:  Flat, positive bowel sounds normal in frequency in pitch, no bruits, no rebound, no guarding, no midline pulsatile mass, no hepatomegaly, no splenomegaly EXT:  2 plus pulses throughout, left greater than right mild leg edema, no cyanosis no clubbing SKIN:  No rashes no nodules NEURO:  Cranial nerves II through XII grossly intact, motor grossly intact throughout PSYCH:  Cognitively intact, oriented to person place and time   Labs: Lab Results  Component Value Date   BUN 49 (H) 08/12/2017   Lab Results  Component Value Date   CREATININE 1.48 (H) 08/12/2017   Lab Results  Component Value Date   NA 135 08/12/2017   K 4.4 08/12/2017   CL 103 08/12/2017   CO2 25 08/12/2017   Lab Results  Component Value Date   TROPONINI 0.03 (HH) 08/12/2017   Lab Results  Component Value Date   WBC  6.1 08/12/2017   HGB 12.9 08/12/2017   HCT 37.7 08/12/2017   MCV 96.2 08/12/2017   PLT 155 08/12/2017   Lab Results  Component Value Date   CHOL 171 03/20/2017   HDL 99.00 03/20/2017   LDLCALC 53 03/20/2017   TRIG 95.0 03/20/2017   CHOLHDL 2 03/20/2017   Lab Results  Component Value Date   ALT 20 03/20/2017   AST 26 03/20/2017   ALKPHOS 94 03/20/2017   BILITOT 0.5 03/20/2017      Radiology:   CXR:  No active cardiopulmonary disease. Large hiatal hernia. Thoracic compression fractures with spinal kyphotic curvature.  EKG:  NSR, rate 87, no acute ST T wave changes.  08/12/2017  ASSESSMENT AND PLAN:   CHEST PAIN:  Chest pain is very atypical and mild and resolved.  There are no EKG changes.  The troponin is nonspecific.  Continue to observe.  Cycle enzymes.  I doubt that any further cardiovascular testing will be indicated.   CHRONIC DIASTOLIC HF:  She seems to be euvolemic.  No change in therapy is indicated.    HTN:  Her BP has been difficult to control.  She cannot afford Bystolic.  It seems that renal is involved in managing her meds and she reports they recently reduced her hydralazine "because of my kidneys".  Probably need to have Dr. Rockey Situ and the nephrologist decide who is managing the BP meds as an out patient.    CKD STAGE III:  Follow creat.   SignedMinus Breeding 08/12/2017, 3:55 PM

## 2017-08-12 NOTE — H&P (Signed)
[facility] NAME: Jill Castro    MR#:  [id number]  DATE OF BIRTH:  08/14/1927  DATE OF ADMISSION:  08/12/2017  PRIMARY CARE PHYSICIAN: Einar Pheasant, MD   REQUESTING/REFERRING PHYSICIAN: Quale  CHIEF COMPLAINT:   Chief Complaint  Patient presents with  . Chest Pain    HISTORY OF PRESENT ILLNESS: Jill Castro  is a 83 y.o. female with a known history of Diastolic CHF, Htn, HLP, CKD stage 3, Uterine cancer,  Lives in assisted living [place]. Had chest tightness going across from left to right side- pressure like, no palpitation, SOB- this morning. BP was high 195 systolic when she checked at home, so called EMS. She have a routine appointment with her cardiologist Dr. Rockey Situ this coming Monday.  PAST MEDICAL HISTORY:   Past Medical History:  Diagnosis Date  . Sherran Needs syndrome   . Diastolic CHF, acute (HCC)    Echo (8/10) with EF 60-65%, mild LVH, mild to moderate TR, PASP 40 mmHg.  . Dizziness    Thought to be side effect of Norvasc  . GERD (gastroesophageal reflux disease)   . Glaucoma   . HTN (hypertension)   . Hypercalcemia    On HCTZ  . Hypercholesterolemia   . Hyperkalemia   . Macular degeneration   . Osteoporosis   . Renal fibromuscular dysplasia ([place])   . Renal insufficiency   . S/P cardiac catheterization 05/2002   Luminal irregularities only in the coronaries. EF 55%. There was some irregulatrity in the right renal artery suggestive of possible fibromuscular dysplasia. Myoview in 5/08 showed no ischemia or infarction. Lexiscan myoview at [facility] (10/10) showed EF 55%, normal wall motion, no evience ofor ischemia or infarction.  . Scoliosis associated with other condition    Chronic back pain  . Uterine cancer ([place])    S/P hypsterectomy in 2008  . Wears hearing aid    bilateral    PAST SURGICAL HISTORY: Past Surgical History:  Procedure Laterality Date  . AQUEOUS SHUNT Left  01/30/2017   Procedure: AQUEOUS SHUNT ahmed tube;  Surgeon: Ronnell Freshwater, MD;  Location: [facility];  Service: Ophthalmology;  Laterality: Left;  ahmed tube shunt with scleral patch graft  . CARDIAC CATHETERIZATION     Left  . CATARACT EXTRACTION Right 07/03/2013   [facility] Dr Wallace Going  . CATARACT EXTRACTION W/PHACO Left 01/30/2017   Procedure: CATARACT EXTRACTION PHACO AND INTRAOCULAR LENS PLACEMENT ([place])  left;  Surgeon: Ronnell Freshwater, MD;  Location: [facility];  Service: Ophthalmology;  Laterality: Left;  . REFRACTIVE SURGERY    . TOTAL ABDOMINAL HYSTERECTOMY W/ BILATERAL SALPINGOOPHORECTOMY     stage I C well differentiated adenocarcinoma    SOCIAL HISTORY:  Social History  Substance Use Topics  . Smoking status: Never Smoker  . Smokeless tobacco: Never Used  . Alcohol use No    FAMILY HISTORY:  Family History  Problem Relation Age of Onset  . Heart disease Father   . Heart disease Mother   . Parkinson's disease Mother   . Diabetes Cousin     DRUG ALLERGIES:  Allergies  Allergen Reactions  . Clonidine   . Benicar [Olmesartan] Other (See Comments)    Lip swelling  . Celebrex [Celecoxib]   . Iodine     Avoids due to decreased kidney function  . Penicillins Rash    REVIEW OF SYSTEMS:   CONSTITUTIONAL: No fever, fatigue or weakness.  EYES: No blurred  or double vision.  EARS, NOSE, AND THROAT: No tinnitus or ear pain.  RESPIRATORY: No cough, shortness of breath, wheezing or hemoptysis.  CARDIOVASCULAR: positive for chest pain,no orthopnea, edema.  GASTROINTESTINAL: No nausea, vomiting, diarrhea or abdominal pain.  GENITOURINARY: No dysuria, hematuria.  ENDOCRINE: No polyuria, nocturia,  HEMATOLOGY: No anemia, easy bruising or bleeding SKIN: No rash or lesion. MUSCULOSKELETAL: No joint pain or arthritis.   NEUROLOGIC: No tingling, numbness, weakness.  PSYCHIATRY: No anxiety or depression.   MEDICATIONS AT HOME:  Prior to  Admission medications   Medication Sig Start Date End Date Taking? Authorizing Provider  amLODipine (NORVASC) 5 MG tablet Take 1 tablet (5 mg total) by mouth 2 (two) times daily as needed. 03/08/17   Minna Merritts, MD  aspirin 81 MG EC tablet Take 81 mg by mouth daily.      [provider]  B Complex Vitamins (PA B-COMPLEX WITH B-12) TABS Take 1 tablet by mouth daily.      [provider]  Calcium Carbonate-Vitamin D (CALCIUM-VITAMIN D) 600-200 MG-UNIT CAPS Take by mouth. Takes 1/2 tablet daily.    [provider]  furosemide (LASIX) 40 MG tablet Take 1 tablet (40 mg total) by mouth daily. 11/09/16   Rise Mu, PA-C  Glucosamine-Chondroitin 1500-1200 MG/30ML LIQD Take by mouth. Takes 1/2 tablet daily.    [provider]  hydrALAZINE (APRESOLINE) 100 MG tablet Take 1 tablet (100 mg total) by mouth 4 (four) times daily. 12/26/16   Minna Merritts, MD  latanoprost (XALATAN) 0.005 % ophthalmic solution Place 1 drop into the left eye at bedtime.  10/18/12   [provider]  losartan (COZAAR) 100 MG tablet TAKE 1 TABLET BY MOUTH DAILY 07/12/17   Minna Merritts, MD  Magnesium 250 MG TABS Take 250 mg by mouth daily.    [provider]  multivitamin [facility]) per tablet Take 1 tablet by mouth daily.      [provider]  nystatin cream (MYCOSTATIN) Apply 1 application topically 2 (two) times daily. 01/26/17   Einar Pheasant, MD  pantoprazole (PROTONIX) 40 MG tablet Take 1 tablet (40 mg total) by mouth daily. 06/06/17   Einar Pheasant, MD  Polyethylene Glycol 3350 (MIRALAX PO) Take by mouth daily.     [provider]  ranitidine (ZANTAC) 150 MG tablet TAKE ONE TABLET BY MOUTH TWICE DAILY 10 TO 15 MIN BEFORE MEAL 05/01/17   [provider]  sertraline (ZOLOFT) 25 MG tablet Take 1 tablet (25 mg total) by mouth daily. 01/26/17   Einar Pheasant, MD  simvastatin (ZOCOR) 10 MG tablet TAKE ONE TABLET AT BEDTIME 06/08/17   Einar Pheasant, MD      PHYSICAL EXAMINATION:   VITAL SIGNS: Height 5\' 4"  (1.626 m), weight 59 kg (130 lb).  GENERAL:  83 y.o.-year-old patient lying in the bed with no acute distress.  EYES: Pupils equal, round, reactive to light and accommodation. No scleral icterus. Extraocular muscles intact.  HEENT: Head atraumatic, normocephalic. Oropharynx and nasopharynx clear.  NECK:  Supple, no jugular venous distention. No thyroid enlargement, no tenderness.  LUNGS: Normal breath sounds bilaterally, no wheezing, rales,rhonchi or crepitation. No use of accessory muscles of respiration.  CARDIOVASCULAR: S1, S2 normal. No murmurs, rubs, or gallops.  ABDOMEN: Soft, nontender, nondistended. Bowel sounds present. No organomegaly or mass.  EXTREMITIES: No pedal edema, cyanosis, or clubbing.  NEUROLOGIC: Cranial nerves II through XII are intact. Muscle strength 5/5 in all extremities. Sensation intact. Gait not checked.  PSYCHIATRIC: The patient is alert and oriented x 3.  SKIN: No obvious rash, lesion, or ulcer.   LABORATORY PANEL:   CBC  Recent Labs Lab 08/12/17 1102  WBC 6.1  HGB 12.9  HCT 37.7  PLT 155  MCV 96.2  MCH 32.9  MCHC 34.2  RDW 13.1   ------------------------------------------------------------------------------------------------------------------  Chemistries   Recent Labs Lab 08/12/17 1102  NA 135  K 4.4  CL 103  CO2 25  GLUCOSE 124*  BUN 49*  CREATININE 1.48*  CALCIUM 9.5   ------------------------------------------------------------------------------------------------------------------ estimated creatinine clearance is 22.7 mL/min (A) (by C-G formula based on SCr of 1.48 mg/dL (H)). ------------------------------------------------------------------------------------------------------------------ No results for input(s): TSH, T4TOTAL, T3FREE, THYROIDAB in the last 72 hours.  Invalid input(s): FREET3   Coagulation profile No results for input(s): INR, PROTIME  in the last 168 hours. ------------------------------------------------------------------------------------------------------------------- No results for input(s): DDIMER in the last 72 hours. -------------------------------------------------------------------------------------------------------------------  Cardiac Enzymes  Recent Labs Lab 08/12/17 1102  TROPONINI <0.03   ------------------------------------------------------------------------------------------------------------------ Invalid input(s): POCBNP  ---------------------------------------------------------------------------------------------------------------  Urinalysis    Component Value Date/Time   COLORURINE YELLOW 07/20/2016 [place] 07/20/2016 1227   LABSPEC 1.010 07/20/2016 1227   PHURINE 6.0 07/20/2016 1227   GLUCOSEU NEGATIVE 07/20/2016 1227   HGBUR NEGATIVE 07/20/2016 1227   BILIRUBINUR NEGATIVE 07/20/2016 1227   KETONESUR NEGATIVE 07/20/2016 1227   UROBILINOGEN 0.2 07/20/2016 1227   NITRITE NEGATIVE 07/20/2016 1227   LEUKOCYTESUR NEGATIVE 07/20/2016 1227     RADIOLOGY: Dg Chest 2 View  Result Date: 08/12/2017 CLINICAL DATA:  Acute presentation with chest pain. EXAM: CHEST  2 VIEW COMPARISON:  03/12/2015 FINDINGS: Heart size is normal. Ordinary aortic atherosclerosis. Large hiatal hernia. Pulmonary vascularity is normal. Lungs show mild chronic scarring but no evidence of active infiltrate, effusion or collapse. Old thoracic compression deformities with kyphotic curvature. IMPRESSION: No active cardiopulmonary disease. Large hiatal hernia. Thoracic compression fractures with spinal kyphotic curvature. Electronically Signed   By: Nelson Chimes M.D.   On: 08/12/2017 11:57   US Venous Img Lower Unilateral Left  Result Date: 08/12/2017 CLINICAL DATA:  Left lower extremity swelling for the past month. History of varicose veins. History of uterine cancer. Evaluate for DVT. EXAM: LEFT LOWER  EXTREMITY VENOUS DOPPLER ULTRASOUND TECHNIQUE: Gray-scale sonography with graded compression, as well as color Doppler and duplex ultrasound were performed to evaluate the lower extremity deep venous systems from the level of the common femoral vein and including the common femoral, femoral, profunda femoral, popliteal and calf veins including the posterior tibial, peroneal and gastrocnemius veins when visible. The superficial great saphenous vein was also interrogated. Spectral Doppler was utilized to evaluate flow at rest and with distal augmentation maneuvers in the common femoral, femoral and popliteal veins. COMPARISON:  None. FINDINGS: Contralateral Common Femoral Vein: Respiratory phasicity is normal and symmetric with the symptomatic side. No evidence of thrombus. Normal compressibility. Common Femoral Vein: No evidence of thrombus. Normal compressibility, respiratory phasicity and response to augmentation. Saphenofemoral Junction: No evidence of thrombus. Normal compressibility and flow on color Doppler imaging. Profunda Femoral Vein: No evidence of thrombus. Normal compressibility and flow on color Doppler imaging. Femoral Vein: No evidence of thrombus. Normal compressibility, respiratory phasicity and response to augmentation. Popliteal Vein: No evidence of thrombus. Normal compressibility, respiratory phasicity and response to augmentation. Calf Veins: No evidence of thrombus. Normal compressibility and flow on color Doppler imaging. Superficial Great Saphenous Vein: No evidence of thrombus. Normal compressibility and flow on color Doppler imaging. Venous Reflux:  None.  Other Findings: Note is made of a trace amount of subcutaneous edema at the level of the left lower leg and ankle. IMPRESSION: No evidence of DVT within the left lower extremity. Electronically Signed   By: Sandi Mariscal M.D.   On: 08/12/2017 12:52    EKG: Orders placed or performed during the hospital encounter of 08/12/17  . ED EKG  within 10 minutes  . ED EKG within 10 minutes  . EKG 12-Lead  . EKG 12-Lead    IMPRESSION AND PLAN:  * Chest pain   Monitor on tele, follow serial troponin, Echo   Cardio consult.  * Uncontrolled Htn    Monitor with Home meds  * CKD stage 3- stable, monitor.  * Hyperlipidemia   Statin.  All the records are reviewed and case discussed with ED provider. Management plans discussed with the patient, family and they are in agreement.  CODE STATUS: full. Code Status History    This patient does not have a recorded code status. Please follow your organizational policy for patients in this situation.    Advance Directive Documentation     Most Recent Value  Type of Advance Directive  Living will  Pre-existing out of facility DNR order (yellow form or pink MOST form)  -  "MOST" Form in Place?  -       TOTAL TIME TAKING CARE OF THIS PATIENT: 45 minutes.    Vaughan Basta M.D on 08/12/2017   Between 7am to 6pm - Pager - [phone]  After 6pm go to [email or web address] - password [email or web address] [facility]  Office  [phone]  CC: Primary care physician; Einar Pheasant, MD   Note: This dictation was prepared with Dragon dictation along with smaller phrase technology. Any transcriptional errors that result from this process are unintentional.

## 2017-08-12 NOTE — Clinical Social Work Note (Addendum)
Clinical Social Work Assessment  Patient Details  Name: Jill Castro MRN: [id number] Date of Birth: 01/27/1927  Date of referral:  08/12/17               Reason for consult:  Grief and Loss                Permission sought to share information with:  Family Supports, Customer service manager Permission granted to share information::  Yes, Verbal Permission Granted  Name::     Allea Kassner [phone] ( in [place]) Daughter Tenna Child [phone]  Agency::  [facility]   Relationship::     Contact Information:     Housing/Transportation Living arrangements for the past 2 months:  [place] of Information:  Patient Patient Interpreter Needed:  None Criminal Activity/Legal Involvement Pertinent to Current Situation/Hospitalization:  No - Comment as needed Significant Relationships:  Adult Children, Hope Lives with:  Facility Resident Do you feel safe going back to the place where you live?  Yes Need for family participation in patient care:  Yes (Comment)  Care giving concerns:  Patient has 2 grown adult children son is in [place] and daughter in [place] / plan: LCSW introduced myself to patient and obtained verbal consent to speak to her family. She has a son HCPOA- Stepahnie Campo and daughter Tenna Child. She was married for 32 years and lived a very comfortable life. She is recently widowed and openly cried. LCSW was supportive and listened to patient recount her many happy memories. Patient has lived at [facility] for 2-3 months in independent living and will eventual move to a SNF in [place] to be near her son. She uses a walker and dresses and bathes and feeds herself. She will return to this facility once discharged. She is not diabetic or on a special diet, nor does she use 02. Patient may need PT consult if she remains for a long time and becomes weak. [employer] Special educational needs teacher further  needs  Employment status:  Retired Forensic scientist:  Sales promotion account executive PT Recommendations:  Not assessed at this time Information / Referral to community resources:     Patient/Family's Response to care:  Patient understands she needs a medical check up  Patient/Family's Understanding of and Emotional Response to Diagnosis, Current Treatment, and Prognosis: Patient has a good understanding  Emotional Assessment Appearance:  Appears younger than stated age Attitude/Demeanor/Rapport:  Other (Positive attitude, greiving 1 year loss of husband) Affect (typically observed):  Calm, Grieving, Accepting Orientation:  Oriented to Self, Oriented to Place, Oriented to  Time, Oriented to Situation Alcohol / Substance use:  Not Applicable Psych involvement (Current and /or in the community):  No (Comment)  Discharge Needs  Concerns to be addressed:  No discharge needs identified Readmission within the last 30 days:  No Current discharge risk:  None Barriers to Discharge:  Continued Medical Work up   [place], [place], LCSW 08/12/2017, 2:03 PM

## 2017-08-13 ENCOUNTER — Observation Stay: Admit: 2017-08-13 | Payer: Medicare Other

## 2017-08-13 LAB — CBC
HCT: 36.9 % (ref 35.0–47.0)
Hemoglobin: 12.9 g/dL (ref 12.0–16.0)
MCH: 33.7 pg (ref 26.0–34.0)
MCHC: 35.1 g/dL (ref 32.0–36.0)
MCV: 95.9 fL (ref 80.0–100.0)
PLATELETS: 156 10*3/uL (ref 150–440)
RBC: 3.85 MIL/uL (ref 3.80–5.20)
RDW: 12.9 % (ref 11.5–14.5)
WBC: 9.1 10*3/uL (ref 3.6–11.0)

## 2017-08-13 LAB — BASIC METABOLIC PANEL
Anion gap: 7 (ref 5–15)
BUN: 37 mg/dL — AB (ref 6–20)
CHLORIDE: 108 mmol/L (ref 101–111)
CO2: 24 mmol/L (ref 22–32)
CREATININE: 1.05 mg/dL — AB (ref 0.44–1.00)
Calcium: 9.5 mg/dL (ref 8.9–10.3)
GFR calc Af Amer: 53 mL/min — ABNORMAL LOW (ref >60)
GFR calc non Af Amer: 46 mL/min — ABNORMAL LOW (ref >60)
GLUCOSE: 99 mg/dL (ref 65–99)
Potassium: 3.8 mmol/L (ref 3.5–5.1)
SODIUM: 139 mmol/L (ref 135–145)

## 2017-08-13 LAB — LIPID PANEL
Cholesterol: 169 mg/dL (ref 0–200)
HDL: 81 mg/dL (ref >40)
LDL Cholesterol: 72 mg/dL (ref 0–99)
Total CHOL/HDL Ratio: 2.1 RATIO
Triglycerides: 81 mg/dL (ref <150)
VLDL: 16 mg/dL (ref 0–40)

## 2017-08-13 NOTE — Progress Notes (Deleted)
Cardiology Office Note  Date:  08/13/2017   ID:  Jill Castro, DOB February 01, 1927, MRN [id number]  PCP:  Einar Pheasant, MD   No chief complaint on file.   HPI:  Jill Castro is an 83 yo pleasant woman with history of  HTN   diastolic CHF,  chronic lower extremity edema (left leg in particular),  severe scoliosis and chronic back pain and requires pain medication.  Husband passed Recently from pancreatic cancer presents for followup of her hypertension and diastolic CHF    In follow-up she reports that she continues having hallucinations, Depressed, Thinking of moving into a nursing facility, [facility] Blood pressure has been running high She could not afford bystolic, this was discontinued and we recommended she increase her hydralazine up to 100 mg 3 times a day Despite this blood pressures continue to run high after 403 up to 474 systolic  Reports that she has had worsening leg swelling Reports that she is taking Lasix 3-4 times per week Wears her compression hose L> R leg swelling Slid out of bed several weeks back, swelling seem to get worse after that  Son lives in [place], daughter in [place] Good days bad days Requiring assistance from neighbors to drive and help with groceries  Continues to have chronic back pain Previously Surveyor, minerals to chiropractic, did not help back pain  Other past medical history Chronic back pain, not a candidate for back surgery Continued eye problem, macular degeneration. Baseline Creatinine 1.4 Tries to wear compression hose when she can.  catheterization in 2003 showing luminal irregularities, negative stress test in October 2010 with no ischemia, echocardiogram August 2010 showing normal systolic function, mild to moderate tricuspid regurgitation, mildly elevated right ventricular systolic pressures consistent with mild pulmonary hypertension, ejection fraction 60%.  PMH:   has a past medical history of Jill Castro Needs syndrome;  Diastolic CHF, acute ([place]); Dizziness; GERD (gastroesophageal reflux disease); Glaucoma; HTN (hypertension); Hypercalcemia; Hypercholesterolemia; Hyperkalemia; Macular degeneration; Osteoporosis; Renal fibromuscular dysplasia ([place]); Renal insufficiency; S/P cardiac catheterization (05/2002); Scoliosis associated with other condition; Uterine cancer ([place]); and Wears hearing aid.  PSH:    Past Surgical History:  Procedure Laterality Date  . AQUEOUS SHUNT Left 01/30/2017   Procedure: AQUEOUS SHUNT ahmed tube;  Surgeon: Ronnell Freshwater, MD;  Location: [facility];  Service: Ophthalmology;  Laterality: Left;  ahmed tube shunt with scleral patch graft  . CARDIAC CATHETERIZATION     Left  . CATARACT EXTRACTION Right 07/03/2013   [facility] Dr Wallace Going  . CATARACT EXTRACTION W/PHACO Left 01/30/2017   Procedure: CATARACT EXTRACTION PHACO AND INTRAOCULAR LENS PLACEMENT ([place])  left;  Surgeon: Ronnell Freshwater, MD;  Location: [facility];  Service: Ophthalmology;  Laterality: Left;  . REFRACTIVE SURGERY    . TOTAL ABDOMINAL HYSTERECTOMY W/ BILATERAL SALPINGOOPHORECTOMY     stage I C well differentiated adenocarcinoma    No current facility-administered medications for this visit.    No current outpatient prescriptions on file.   Facility-Administered Medications Ordered in Other Visits  Medication Dose Route Frequency Provider Last Rate Last Dose  . acetaminophen (TYLENOL) tablet 650 mg  650 mg Oral Q6H PRN Hugelmeyer, Alexis, DO   650 mg at 08/13/17 0950  . amLODipine (NORVASC) tablet 5 mg  5 mg Oral Daily Vaughan Basta, MD   5 mg at 08/13/17 0923  . aspirin EC tablet 81 mg  81 mg Oral Daily Vaughan Basta, MD   81 mg at 08/13/17 2595  . docusate sodium (COLACE) capsule 100  mg  100 mg Oral BID PRN Vaughan Basta, MD      . heparin injection 5,000 Units  5,000 Units Subcutaneous Q8H Vaughan Basta, MD      . hydrALAZINE (APRESOLINE) tablet  100 mg  100 mg Oral QID Vaughan Basta, MD   100 mg at 08/13/17 2130  . latanoprost (XALATAN) 0.005 % ophthalmic solution 1 drop  1 drop Left Eye QHS Vaughan Basta, MD   1 drop at 08/12/17 2132  . losartan (COZAAR) tablet 100 mg  100 mg Oral Daily Vaughan Basta, MD   100 mg at 08/13/17 0923  . nitroGLYCERIN (NITROSTAT) SL tablet 0.4 mg  0.4 mg Sublingual Q5 min PRN Vaughan Basta, MD   0.4 mg at 08/12/17 1400  . pantoprazole (PROTONIX) EC tablet 40 mg  40 mg Oral QPM Vaughan Basta, MD   40 mg at 08/12/17 1708  . sertraline (ZOLOFT) tablet 25 mg  25 mg Oral Daily Vaughan Basta, MD      . simvastatin (ZOCOR) tablet 10 mg  10 mg Oral QHS Vaughan Basta, MD   10 mg at 08/12/17 2132  . sodium chloride flush (NS) 0.9 % injection 3 mL  3 mL Intravenous Q12H Henreitta Leber, MD   3 mL at 08/13/17 8657     Allergies:   Clonidine; Benicar [olmesartan]; Celebrex [celecoxib]; Iodine; and Penicillins   Social History:  The patient  reports that she has never smoked. She has never used smokeless tobacco. She reports that she does not drink alcohol or use drugs.   Family History:   family history includes Diabetes in her cousin; Heart disease in her father and mother; Parkinson's disease in her mother.    Review of Systems: Review of Systems  Constitutional: Negative.   Respiratory: Negative.   Cardiovascular: Negative.   Gastrointestinal: Negative.   Musculoskeletal: Negative.   Neurological: Negative.   Psychiatric/Behavioral: Negative.   All other systems reviewed and are negative.    PHYSICAL EXAM: VS:  There were no vitals taken for this visit. , BMI There is no height or weight on file to calculate BMI. GEN: Well nourished, well developed, in no acute distress, appears frail, using a cane  HEENT: normal  Neck: no JVD, carotid bruits, or masses Cardiac: RRR; no murmurs, rubs, or gallops, trace pitting edema bilaterally with  compression hose in place  Respiratory:  clear to auscultation bilaterally, normal work of breathing GI: soft, nontender, nondistended, + BS MS: no deformity or atrophy  Skin: warm and dry, no rash Neuro:  Strength and sensation are intact Psych: euthymic mood, full affect    Recent Labs: 03/20/2017: ALT 20 08/13/2017: BUN 37; Creatinine, Ser 1.05; Hemoglobin 12.9; Platelets 156; Potassium 3.8; Sodium 139    Lipid Panel Lab Results  Component Value Date   CHOL 169 08/13/2017   HDL 81 08/13/2017   LDLCALC 72 08/13/2017   TRIG 81 08/13/2017      Wt Readings from Last 3 Encounters:  08/12/17 130 lb (59 kg)  06/06/17 130 lb (59 kg)  05/01/17 130 lb (59 kg)       ASSESSMENT AND PLAN:  Hypercholesterolemia Cholesterol is at goal on the current lipid regimen. No changes to the medications were made.  DIASTOLIC HEART FAILURE, CHRONIC Continue Lasix several days per week If swelling does get worse, could consider echocardiogram Lungs are clear, no shortness of breath on exertion.  Essential hypertension Blood pressure is well controlled on today's visit. No changes made to the medications.  Edema, unspecified type Consistent with venous insufficiency, Less likely cardiac. Recommend she continue compression hose  Chronic kidney disease (CKD), stage III (moderate) Recommended she try not to take Lasix daily, no more than 3-4 times per week For climbing BUN and creatinine, may need to decrease Lasix  Visual hallucinations Etiology of her visual hallucinations is not clear Seem to happen after loss of her husband Long discussion concerning various treatment options.  Perhaps might improve with change of location for example if she moves into [facility]. Unable to exclude neurologic issue  Disposition:   F/U  6 months   Total encounter time more than 25 minutes  Greater than 50% was spent in counseling and coordination of care with the patient   No orders of the  defined types were placed in this encounter.    Signed, Esmond Plants, M.D., Ph.D. 08/13/2017  [place], [place]

## 2017-08-13 NOTE — Discharge Instructions (Signed)

## 2017-08-13 NOTE — Discharge Summary (Signed)
[facility] [place] NAME: Jill Castro    MR#:  [id number]  DATE OF BIRTH:  Jan 22, 1927  DATE OF ADMISSION:  08/12/2017 ADMITTING PHYSICIAN: Vaughan Basta, MD  DATE OF DISCHARGE: 08/13/2017  PRIMARY CARE PHYSICIAN: Einar Pheasant, MD    ADMISSION DIAGNOSIS:  Moderate risk chest pain [R07.9]  DISCHARGE DIAGNOSIS:  Principal Problem:   Chest pain   SECONDARY DIAGNOSIS:   Past Medical History:  Diagnosis Date  . Sherran Needs syndrome   . Diastolic CHF, acute (HCC)    Echo (8/10) with EF 60-65%, mild LVH, mild to moderate TR, PASP 40 mmHg.  . Dizziness    Thought to be side effect of Norvasc  . GERD (gastroesophageal reflux disease)   . Glaucoma   . HTN (hypertension)   . Hypercalcemia    On HCTZ  . Hypercholesterolemia   . Hyperkalemia   . Macular degeneration   . Osteoporosis   . Renal fibromuscular dysplasia ([place])   . Renal insufficiency   . S/P cardiac catheterization 05/2002   Luminal irregularities only in the coronaries. EF 55%. There was some irregulatrity in the right renal artery suggestive of possible fibromuscular dysplasia. Myoview in 5/08 showed no ischemia or infarction. Lexiscan myoview at [facility] (10/10) showed EF 55%, normal wall motion, no evience ofor ischemia or infarction.  . Scoliosis associated with other condition    Chronic back pain  . Uterine cancer ([place])    S/P hypsterectomy in 2008  . Wears hearing aid    bilateral    HOSPITAL COURSE:   83 year old female with past medical history of scoliosis, hypertension, hyperlipidemia, glaucoma, GERD, anxiety, depression who presented to the hospital due to atypical chest pain.  1. Chest pain-patient presented to the hospital due to crushing chest pain and therefore was observed overnight on telemetry. Patient had 3 sets of cardiac markers checked which have been essentially normal. A cardiology consult was obtained who did not recommend any acute  further intervention. -clinically she is asymptomatic and therefore being discharged home. Most of her symptoms are related to anxiety. - patient will continue aspirin, losartan, hydralazine.  2. Essential HTN - pt. Will cont. Her Norvasc, Hydralazine, Losartan.  - further changes to her BP meds as outpatient.   3. Hyperlipidemia - cont. Simvastatin  4. GERD - pt. Will resume her Protonix.   5. Glaucoma - pt. Will resume her Cosopt, Latanoprost eye drops.     DISCHARGE CONDITIONS:   Stable.   CONSULTS OBTAINED:  Treatment Team:  Minna Merritts, MD  DRUG ALLERGIES:   Allergies  Allergen Reactions  . Clonidine   . Benicar [Olmesartan] Other (See Comments)    Lip swelling  . Celebrex [Celecoxib]   . Iodine     Avoids due to decreased kidney function  . Penicillins Rash    DISCHARGE MEDICATIONS:   Allergies as of 08/13/2017      Reactions   Clonidine    Benicar [olmesartan] Other (See Comments)   Lip swelling   Celebrex [celecoxib]    Iodine    Avoids due to decreased kidney function   Penicillins Rash      Medication List    STOP taking these medications   sertraline 25 MG tablet Commonly known as:  ZOLOFT     TAKE these medications   amLODipine 5 MG tablet Commonly known as:  NORVASC Take 1 tablet (5 mg total) by mouth 2 (two) times daily as needed.   aspirin  81 MG EC tablet Take 81 mg by mouth daily.   BIOFREEZE ROLL-ON EX Apply 1 application topically daily.   Calcium-Vitamin D 600-200 MG-UNIT Caps Take by mouth. Takes 1/2 tablet daily.   dorzolamide-timolol 22.3-6.8 MG/ML ophthalmic solution Commonly known as:  COSOPT Place 1 drop into both eyes 2 (two) times daily.   furosemide 40 MG tablet Commonly known as:  LASIX Take 1 tablet (40 mg total) by mouth daily.   Glucosamine-Chondroitin 1500-1200 MG/30ML Liqd Take by mouth. Takes 1/2 tablet daily.   hydrALAZINE 100 MG tablet Commonly known as:  APRESOLINE Take 1 tablet (100 mg  total) by mouth 4 (four) times daily.   latanoprost 0.005 % ophthalmic solution Commonly known as:  XALATAN Place 1 drop into the left eye at bedtime.   losartan 100 MG tablet Commonly known as:  COZAAR TAKE 1 TABLET BY MOUTH DAILY   Magnesium 250 MG Tabs Take 250 mg by mouth daily.   MIRALAX PO Take by mouth daily.   multivitamin per tablet Take 1 tablet by mouth daily.   nystatin cream Commonly known as:  MYCOSTATIN Apply 1 application topically 2 (two) times daily.   PA B-COMPLEX WITH B-12 Tabs Take 1 tablet by mouth daily.   pantoprazole 40 MG tablet Commonly known as:  PROTONIX Take 1 tablet (40 mg total) by mouth daily.   ranitidine 150 MG tablet Commonly known as:  ZANTAC TAKE ONE TABLET BY MOUTH TWICE DAILY 10 TO 15 MIN BEFORE MEAL   simvastatin 10 MG tablet Commonly known as:  ZOCOR TAKE ONE TABLET AT BEDTIME            Discharge Care Instructions        Start     Ordered   08/13/17 0000  Activity as tolerated - No restrictions     08/13/17 1025   08/13/17 0000  Diet - low sodium heart healthy     08/13/17 1025        DISCHARGE INSTRUCTIONS:   DIET:  Cardiac diet  DISCHARGE CONDITION:  Stable  ACTIVITY:  Activity as tolerated  OXYGEN:  Home Oxygen: No.   Oxygen Delivery: room air  DISCHARGE LOCATION:  home   If you experience worsening of your admission symptoms, develop shortness of breath, life threatening emergency, suicidal or homicidal thoughts you must seek medical attention immediately by calling 911 or calling your MD immediately  if symptoms less severe.  You Must read complete instructions/literature along with all the possible adverse reactions/side effects for all the Medicines you take and that have been prescribed to you. Take any new Medicines after you have completely understood and accpet all the possible adverse reactions/side effects.   Please note  You were cared for by a hospitalist during your hospital  stay. If you have any questions about your discharge medications or the care you received while you were in the hospital after you are discharged, you can call the unit and asked to speak with the hospitalist on call if the hospitalist that took care of you is not available. Once you are discharged, your primary care physician will handle any further medical issues. Please note that NO REFILLS for any discharge medications will be authorized once you are discharged, as it is imperative that you return to your primary care physician (or establish a relationship with a primary care physician if you do not have one) for your aftercare needs so that they can reassess your need for medications and monitor your lab  values.     Today   Chest pain resolved.  Complaining of some dizziness and will check orthostatics and if (-) then d/c home.  Most of her symptoms are anxiety mediated.   VITAL SIGNS:  Blood pressure 138/79, pulse 83, temperature 97.7 F (36.5 C), temperature source Oral, resp. rate 19, height 5\' 4"  (1.626 m), weight 59 kg (130 lb), SpO2 92 %.  I/O:   Intake/Output Summary (Last 24 hours) at 08/13/17 1151 Last data filed at 08/13/17 0900  Gross per 24 hour  Intake              240 ml  Output             1900 ml  Net            -1660 ml    PHYSICAL EXAMINATION:  GENERAL:  83 y.o.-year-old patient lying in the bed with no acute distress.  EYES: Pupils equal, round, reactive to light and accommodation. No scleral icterus. Extraocular muscles intact.  HEENT: Head atraumatic, normocephalic. Oropharynx and nasopharynx clear.  NECK:  Supple, no jugular venous distention. No thyroid enlargement, no tenderness.  LUNGS: Normal breath sounds bilaterally, no wheezing, rales,rhonchi. No use of accessory muscles of respiration.  CARDIOVASCULAR: S1, S2 normal. No murmurs, rubs, or gallops.  ABDOMEN: Soft, non-tender, non-distended. Bowel sounds present. No organomegaly or mass.  EXTREMITIES: No  pedal edema, cyanosis, or clubbing.  NEUROLOGIC: Cranial nerves II through XII are intact. No focal motor or sensory defecits b/l.  PSYCHIATRIC: The patient is alert and oriented x 3. Anxious SKIN: No obvious rash, lesion, or ulcer.   DATA REVIEW:   CBC  Recent Labs Lab 08/13/17 0437  WBC 9.1  HGB 12.9  HCT 36.9  PLT 156    Chemistries   Recent Labs Lab 08/13/17 0437  NA 139  K 3.8  CL 108  CO2 24  GLUCOSE 99  BUN 37*  CREATININE 1.05*  CALCIUM 9.5    Cardiac Enzymes  Recent Labs Lab 08/12/17 2114  TROPONINI 0.04*      RADIOLOGY:  Dg Chest 2 View  Result Date: 08/12/2017 CLINICAL DATA:  Acute presentation with chest pain. EXAM: CHEST  2 VIEW COMPARISON:  03/12/2015 FINDINGS: Heart size is normal. Ordinary aortic atherosclerosis. Large hiatal hernia. Pulmonary vascularity is normal. Lungs show mild chronic scarring but no evidence of active infiltrate, effusion or collapse. Old thoracic compression deformities with kyphotic curvature. IMPRESSION: No active cardiopulmonary disease. Large hiatal hernia. Thoracic compression fractures with spinal kyphotic curvature. Electronically Signed   By: Nelson Chimes M.D.   On: 08/12/2017 11:57   US Venous Img Lower Unilateral Left  Result Date: 08/12/2017 CLINICAL DATA:  Left lower extremity swelling for the past month. History of varicose veins. History of uterine cancer. Evaluate for DVT. EXAM: LEFT LOWER EXTREMITY VENOUS DOPPLER ULTRASOUND TECHNIQUE: Gray-scale sonography with graded compression, as well as color Doppler and duplex ultrasound were performed to evaluate the lower extremity deep venous systems from the level of the common femoral vein and including the common femoral, femoral, profunda femoral, popliteal and calf veins including the posterior tibial, peroneal and gastrocnemius veins when visible. The superficial great saphenous vein was also interrogated. Spectral Doppler was utilized to evaluate flow at rest and  with distal augmentation maneuvers in the common femoral, femoral and popliteal veins. COMPARISON:  None. FINDINGS: Contralateral Common Femoral Vein: Respiratory phasicity is normal and symmetric with the symptomatic side. No evidence of thrombus. Normal compressibility. Common Femoral Vein:  No evidence of thrombus. Normal compressibility, respiratory phasicity and response to augmentation. Saphenofemoral Junction: No evidence of thrombus. Normal compressibility and flow on color Doppler imaging. Profunda Femoral Vein: No evidence of thrombus. Normal compressibility and flow on color Doppler imaging. Femoral Vein: No evidence of thrombus. Normal compressibility, respiratory phasicity and response to augmentation. Popliteal Vein: No evidence of thrombus. Normal compressibility, respiratory phasicity and response to augmentation. Calf Veins: No evidence of thrombus. Normal compressibility and flow on color Doppler imaging. Superficial Great Saphenous Vein: No evidence of thrombus. Normal compressibility and flow on color Doppler imaging. Venous Reflux:  None. Other Findings: Note is made of a trace amount of subcutaneous edema at the level of the left lower leg and ankle. IMPRESSION: No evidence of DVT within the left lower extremity. Electronically Signed   By: Sandi Mariscal M.D.   On: 08/12/2017 12:52      Management plans discussed with the patient, family and they are in agreement.  CODE STATUS:     Code Status Orders        Start     Ordered   08/12/17 1536  Full code  Continuous     08/12/17 1536    Code Status History    Date Active Date Inactive Code Status Order ID Comments User Context   This patient has a current code status but no historical code status.    Advance Directive Documentation     Most Recent Value  Type of Advance Directive  Living will  Pre-existing out of facility DNR order (yellow form or pink MOST form)  -  "MOST" Form in Place?  -      TOTAL TIME TAKING CARE  OF THIS PATIENT: 40 minutes.    Henreitta Leber M.D on 08/13/2017 at 11:51 AM  Between 7am to 6pm - Pager - [phone]  After 6pm go to [email or web address] - Proofreader  [facility]  Office  [phone]  CC: Primary care physician; Einar Pheasant, MD

## 2017-08-13 NOTE — Care Management Obs Status (Signed)
[place] NOTIFICATION   Patient Details  Name: ZANETA LIGHTCAP MRN: [id number] Date of Birth: 08/16/1927   Medicare Observation Status Notification Given:  No (Discharged within 24 hours)    Coraleigh Sheeran A, RN 08/13/2017, 10:57 AM

## 2017-08-13 NOTE — NC FL2 (Signed)
[place] LEVEL OF CARE SCREENING TOOL     IDENTIFICATION  Patient Name: Jill Castro Birthdate: [birth year] Sex: female Admission Date (Current Location): 08/12/2017  [place] and [place] Number:  Engineering geologist and Address:  [facility], [address], [place], [place] [address]      Provider Number: [id number]  Attending Physician Name and Address:  Henreitta Leber, MD  Relative Name and Phone Number:  Callen Zuba [phone]    Current Level of Care: Domiciliary (Rest home) ([place]) Recommended Level of Care: [place] Prior Approval Number:    Date Approved/Denied:   PASRR Number:    Discharge Plan: Domiciliary (Rest home) [facility])    Current Diagnoses: Patient Active Problem List   Diagnosis Date Noted  . Depression 01/29/2017  . Visual hallucinations 07/21/2016  . Loss of weight 05/12/2016  . Facial lesion 08/30/2015  . Health care maintenance 04/22/2015  . DOE (dyspnea on exertion) 02/03/2015  . Drainage from nose 10/27/2014  . GERD (gastroesophageal reflux disease) 03/15/2013  . Right shoulder pain 11/30/2012  . Chronic kidney disease (CKD), stage III (moderate) 10/18/2012  . Hyperkalemia 10/18/2012  . Osteoporosis 10/18/2012  . History of endometrial cancer 10/18/2012  . Chronic back pain 10/18/2012  . Hypertension 10/16/2012  . EDEMA 10/30/2009  . Hypercholesterolemia 09/22/2009  . Chest pain 09/08/2009  . DIASTOLIC HEART FAILURE, CHRONIC 07/07/2009  . Shortness of breath 07/07/2009    Orientation RESPIRATION BLADDER Height & Weight     Self, Time, Situation, Place  Normal Continent Weight: 130 lb (59 kg) Height:  5\' 4"  (162.6 cm)  BEHAVIORAL SYMPTOMS/MOOD NEUROLOGICAL BOWEL NUTRITION STATUS      Continent  (Normal)  AMBULATORY STATUS COMMUNICATION OF NEEDS Skin   Independent (uses walker) Verbally Normal                       Personal  Care Assistance Level of Assistance  Bathing, Feeding, Dressing, Total care Bathing Assistance: Independent Feeding assistance: Independent Dressing Assistance: Independent Total Care Assistance: Independent   Functional Limitations Info  Sight, Hearing, Speech Sight Info: Adequate Hearing Info: Adequate Speech Info: Adequate    SPECIAL CARE FACTORS FREQUENCY                       Contractures Contractures Info: Not present    Additional Factors Info  Allergies (Full)   Allergies Info: Clonidine, Benicar Olmesartan, Celebrex Celecoxib, Iodine, Penicillins           Current Medications (08/13/2017):  This is the current hospital active medication list Current Facility-Administered Medications  Medication Dose Route Frequency Provider Last Rate Last Dose  . acetaminophen (TYLENOL) tablet 650 mg  650 mg Oral Q6H PRN Hugelmeyer, Alexis, DO   650 mg at 08/12/17 2132  . amLODipine (NORVASC) tablet 5 mg  5 mg Oral Daily Vaughan Basta, MD   5 mg at 08/12/17 1633  . aspirin EC tablet 81 mg  81 mg Oral Daily Vaughan Basta, MD   81 mg at 08/12/17 1633  . docusate sodium (COLACE) capsule 100 mg  100 mg Oral BID PRN Vaughan Basta, MD      . heparin injection 5,000 Units  5,000 Units Subcutaneous Q8H Vaughan Basta, MD      . hydrALAZINE (APRESOLINE) tablet 100 mg  100 mg Oral QID Vaughan Basta, MD   100 mg at 08/12/17 2144  . latanoprost (XALATAN)  0.005 % ophthalmic solution 1 drop  1 drop Left Eye QHS Vaughan Basta, MD   1 drop at 08/12/17 2132  . losartan (COZAAR) tablet 100 mg  100 mg Oral Daily Vaughan Basta, MD      . nitroGLYCERIN (NITROSTAT) SL tablet 0.4 mg  0.4 mg Sublingual Q5 min PRN Vaughan Basta, MD   0.4 mg at 08/12/17 1400  . pantoprazole (PROTONIX) EC tablet 40 mg  40 mg Oral QPM Vaughan Basta, MD   40 mg at 08/12/17 1708  . sertraline (ZOLOFT) tablet 25 mg  25 mg Oral Daily Vaughan Basta, MD      . simvastatin (ZOCOR) tablet 10 mg  10 mg Oral QHS Vaughan Basta, MD   10 mg at 08/12/17 2132  . sodium chloride flush (NS) 0.9 % injection 3 mL  3 mL Intravenous Q12H Henreitta Leber, MD   3 mL at 08/12/17 2345     Discharge Medications: Please see discharge summary for a list of discharge medications.  Relevant Imaging Results:  Relevant Lab Results:   Additional Information SSN [id number]  Joana Reamer, [place]

## 2017-08-14 ENCOUNTER — Telehealth: Payer: Self-pay | Admitting: *Deleted

## 2017-08-14 ENCOUNTER — Encounter: Payer: Self-pay | Admitting: Cardiovascular Disease

## 2017-08-14 ENCOUNTER — Ambulatory Visit (INDEPENDENT_AMBULATORY_CARE_PROVIDER_SITE_OTHER): Payer: Medicare Other | Admitting: Cardiovascular Disease

## 2017-08-14 ENCOUNTER — Ambulatory Visit: Payer: Medicare Other | Admitting: Cardiovascular Disease

## 2017-08-14 VITALS — BP 127/74 | HR 81 | Ht 64.0 in | Wt 125.0 lb

## 2017-08-14 DIAGNOSIS — R0609 Other forms of dyspnea: Secondary | ICD-10-CM | POA: Diagnosis not present

## 2017-08-14 DIAGNOSIS — I10 Essential (primary) hypertension: Secondary | ICD-10-CM | POA: Diagnosis not present

## 2017-08-14 DIAGNOSIS — R079 Chest pain, unspecified: Secondary | ICD-10-CM | POA: Diagnosis not present

## 2017-08-14 DIAGNOSIS — R600 Localized edema: Secondary | ICD-10-CM

## 2017-08-14 DIAGNOSIS — E7800 Pure hypercholesterolemia, unspecified: Secondary | ICD-10-CM

## 2017-08-14 DIAGNOSIS — I5032 Chronic diastolic (congestive) heart failure: Secondary | ICD-10-CM

## 2017-08-14 MED ORDER — SIMVASTATIN 10 MG PO TABS: 10.0000 mg | ORAL_TABLET | Freq: Every day | ORAL | 3 refills | Status: DC

## 2017-08-14 MED ORDER — LOSARTAN POTASSIUM 100 MG PO TABS: 100.0000 mg | ORAL_TABLET | Freq: Every day | ORAL | 3 refills | Status: DC

## 2017-08-14 NOTE — Telephone Encounter (Signed)
Pt will need a follow up after her ED visit , please advise appt time and date.  Pt contact [phone]

## 2017-08-14 NOTE — Patient Instructions (Addendum)
Medication Instructions:   No medication changes made  Please take extra 1/2 hydralazine as needed for blood pressure >170  If no improvement in blood pressure after one hour with hydralazine, take amlodipine  Call if you would like nitro SL to take as needed for high pressures  Labwork:  No new labs needed  Testing/Procedures:  No further testing at this time   Follow-Up: It was a pleasure seeing you in the office today. Please call us if you have new issues that need to be addressed before your next appt.  [phone]  Your physician wants you to follow-up in: 6 months.  You will receive a reminder letter in the mail two months in advance. If you don't receive a letter, please call our office to schedule the follow-up appointment.  If you need a refill on your cardiac medications before your next appointment, please call your pharmacy.

## 2017-08-14 NOTE — Telephone Encounter (Signed)
Patient states she was seen in ED For blood pressure on Saturday. She states she came home Sunday. Was old to make f/u states that she is feeling better but was told she needs to be seen this week by ED

## 2017-08-14 NOTE — Telephone Encounter (Signed)
She was seen by Dr Rockey Situ today.  He saw her for ER follow up.  Changed her blood pressure medication.  Does she still need f/u with me this week?

## 2017-08-14 NOTE — Progress Notes (Signed)
Cardiology Office Note  Date:  08/14/2017   ID:  Jill Castro, DOB [birth year], MRN [id number]  PCP:  Jill Pheasant, MD   Chief Complaint  Patient presents with  . other    ED follow up for chest pain. Meds reviewed verbally with patient.     HPI:   Jill Castro is an 83 yo pleasant woman with history of  HTN  diastolic CHF,  chronic lower extremity edema (left leg in particular),  She has severe scoliosis and chronic back pain and requires pain medication.  Depression catheterization in 2003 showing luminal irregularities, Husband passed  from pancreatic cancer presents for followup of her hypertension and diastolic CHF   recent hospital admission with discharge 08/12/2017, for chest pain Hospital records reviewed with the patient in detail Reported having chest pain when walking with her walker to the dining room lower chest right and left and center Symptoms at rest, not made worse with movement or deep breathing EMS called for systolic pressure 638 In the hospital blood pressure 166/98 Notes indicating hydralazine dose had been decreased At discharge she was continued on Norvasc As needed,   Hydralazine 50 mg 3 times a day,  Losartan  Previously reported having hallucinations on prior office visit, this seems to have resolved Living in a nursing home, [place] Less depressed Playing with the idea of moving to [place] with her son  could not afford bystolic, this was discontinued  Leg swelling still significant on the left, normal on the right  taking Lasix sparingly during the week No recent falls, walks with a walker  Son lives in [place], daughter in [place] Requiring assistance from neighbors to drive and help with groceries   chronic back pain EKG personally reviewed by myself on todays visit Shows normal sinus rhythm rate 87 bpm no significant ST or T-wave changes  Other past medical history Chronic back pain, not a candidate for back  surgery Continued eye problem, macular degeneration. Baseline Creatinine 1.4 Tries to wear compression hose when she can.  catheterization in 2003 showing luminal irregularities, negative stress test in October 2010 with no ischemia, echocardiogram August 2010 showing normal systolic function, mild to moderate tricuspid regurgitation, mildly elevated right ventricular systolic pressures consistent with mild pulmonary hypertension, ejection fraction 60%.  PMH:   has a past medical history of Jill Castro syndrome; Diastolic CHF, acute ([facility]); Dizziness; GERD (gastroesophageal reflux disease); Glaucoma; HTN (hypertension); Hypercalcemia; Hypercholesterolemia; Hyperkalemia; Macular degeneration; Osteoporosis; Renal fibromuscular dysplasia ([facility]); Renal insufficiency; S/P cardiac catheterization (05/2002); Scoliosis associated with other condition; Uterine cancer (Jill Castro); and Wears hearing aid.  PSH:    Past Surgical History:  Procedure Laterality Date  . AQUEOUS SHUNT Left 01/30/2017   Procedure: AQUEOUS SHUNT ahmed tube;  Surgeon: Ronnell Freshwater, MD;  Location: [facility];  Service: Ophthalmology;  Laterality: Left;  ahmed tube shunt with scleral patch graft  . CARDIAC CATHETERIZATION     Left  . CATARACT EXTRACTION Right 07/03/2013   [facility] Dr [facility] Going  . CATARACT EXTRACTION W/PHACO Left 01/30/2017   Procedure: CATARACT EXTRACTION PHACO AND INTRAOCULAR LENS PLACEMENT ([facility])  left;  Surgeon: Ronnell Freshwater, MD;  Location: [facility];  Service: Ophthalmology;  Laterality: Left;  . REFRACTIVE SURGERY    . 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 amLODipine (NORVASC) 5 MG tablet Take 1 tablet (5 mg total) by  mouth 2 (two) times daily as needed. 60 tablet 3  . aspirin 81 MG EC tablet Take 81 mg by mouth daily.      . B  Complex Vitamins (PA B-COMPLEX WITH B-12) TABS Take 1 tablet by mouth daily.      . Calcium Carbonate-Vitamin D (CALCIUM-VITAMIN D) 600-200 MG-UNIT CAPS Take by mouth. Takes 1/2 tablet daily.    . furosemide (LASIX) 40 MG tablet Take 1 tablet (40 mg total) by mouth daily. 30 tablet 6  . Glucosamine-Chondroitin 1500-1200 MG/30ML LIQD Take by mouth. Takes 1/2 tablet daily.    . hydrALAZINE (APRESOLINE) 100 MG tablet Take 1 tablet (100 mg total) by mouth 4 (four) times daily. 360 tablet 3  . latanoprost (XALATAN) 0.005 % ophthalmic solution Place 1 drop into the left eye at bedtime.     Marland Kitchen losartan (COZAAR) 100 MG tablet Take 1 tablet (100 mg total) by mouth daily. 90 tablet 3  . Magnesium 250 MG TABS Take 250 mg by mouth daily.    . Menthol, Topical Analgesic, (BIOFREEZE ROLL-ON EX) Apply 1 application topically daily.    . multivitamin (THERAGRAN) per tablet Take 1 tablet by mouth daily.      Marland Kitchen nystatin cream (MYCOSTATIN) Apply 1 application topically 2 (two) times daily. 30 g 0  . Polyethylene Glycol 3350 (MIRALAX PO) Take by mouth daily.     . ranitidine (ZANTAC) 150 MG tablet TAKE ONE TABLET BY MOUTH TWICE DAILY 10 TO 15 MIN BEFORE MEAL    . simvastatin (ZOCOR) 10 MG tablet Take 1 tablet (10 mg total) by mouth at bedtime. 90 tablet 3   No current facility-administered medications for this visit.      Allergies:   Clonidine; Benicar [olmesartan]; Celebrex [celecoxib]; Iodine; and Penicillins   Social History:  The patient  reports that she has never smoked. She has never used smokeless tobacco. She reports that she does not drink alcohol or use drugs.   Family History:   family history includes Diabetes in her cousin; Heart disease in her father and mother; Parkinson's disease in her mother.    Review of Systems: Review of Systems  Constitutional: Negative.   Respiratory: Negative.   Cardiovascular: Negative.   Gastrointestinal: Negative.   Musculoskeletal: Negative.   Neurological:  Negative.   Psychiatric/Behavioral: Negative.   All other systems reviewed and are negative.    PHYSICAL EXAM: VS:  BP 127/74 (BP Location: Left Arm, Patient Position: Sitting, Cuff Size: Normal)   Pulse 81   Ht 5\' 4"  (1.626 m)   Wt 125 lb (56.7 kg)   BMI 21.46 kg/m  , BMI Body mass index is 21.46 kg/m.  GEN: Well nourished, well developed, in no acute distress, appears frail, using a cane  HEENT: normal  Neck: no JVD, carotid bruits, or masses Cardiac: RRR; no murmurs, rubs, or gallops, trace pitting edema bilaterally with compression hose in place  Respiratory:  clear to auscultation bilaterally, normal work of breathing GI: soft, nontender, nondistended, + BS MS: no deformity or atrophy  Skin: warm and dry, no rash Neuro:  Strength and sensation are intact Psych: euthymic mood, full affect    Recent Labs: 03/20/2017: ALT 20 08/13/2017: BUN 37; Creatinine, Ser 1.05; Hemoglobin 12.9; Platelets 156; Potassium 3.8; Sodium 139    Lipid Panel Lab Results  Component Value Date   CHOL 169 08/13/2017   HDL 81 08/13/2017   LDLCALC 72 08/13/2017   TRIG 81 08/13/2017      Wt Readings  from Last 3 Encounters:  08/14/17 125 lb (56.7 kg)  08/12/17 130 lb (59 kg)  06/06/17 130 lb (59 kg)       ASSESSMENT AND PLAN:  Hypercholesterolemia Cholesterol is at goal on the current lipid regimen. No changes to the medications were made. She is taking simvastatin 10 mg daily  DIASTOLIC HEART FAILURE, CHRONIC Continue Lasix sparingly during the week Appears euvolemic Lungs are clear, no shortness of breath on exertion.  Essential hypertension Long discussion concerning her blood pressure and recent hospital admission for chest pain She is taking hydralazine 50 mg 3 times a day with losartan Has noticed blood pressure occasionally up to 190 Low number on today's visit in the office, seems to be labile Recommended she take extra 50 mg hydralazine for  systolic pressure of 239 or  higher that does not come down on repeat  Edema, unspecified type Consistent with venous insufficiency,  continue compression hose  Chronic kidney disease (CKD), stage III (moderate) Recommended she try not to take Lasix daily, no more than 3 times per week Appears euvolemic, stable   atypical chest pain Hospitalization records reviewed, ruled out, no EKG changes, felt to be atypical Recommended she try carbonated soda, Gas-X, Tums for possible GI etiology Symptoms presented after a meal  Disposition:   F/U  6 months   Total encounter time more than 45 minutes  Greater than 50% was spent in counseling and coordination of care with the patient   No orders of the defined types were placed in this encounter.    Signed, Esmond Plants, M.D., Ph.D. 08/14/2017  [place], [place]

## 2017-08-14 NOTE — Telephone Encounter (Signed)
No answer no vm

## 2017-08-15 NOTE — Telephone Encounter (Signed)
Patient says that she still wants to see you. She says she is satisfied with the changes Dr. Rockey Situ made to her BP meds but she is wanting to talk to you about having loose stools. She says it is not diarrhea or runny just loose. She has been having them since Saturday. She stopped taking her miralax on Saturday when she started having the loose stool. Patient denies blood in stool, nausea, vomiting, fever, chills, etc. Please advise.

## 2017-08-15 NOTE — Telephone Encounter (Signed)
Please schedule patient for 12:00 on Friday.

## 2017-08-15 NOTE — Telephone Encounter (Signed)
Patient scheduled.

## 2017-08-15 NOTE — Telephone Encounter (Signed)
Left message to return call to our office.  

## 2017-08-15 NOTE — Telephone Encounter (Signed)
PT returned office phone call. Please call her back.

## 2017-08-15 NOTE — Telephone Encounter (Signed)
I can see her 12:00 Friday for work in appt - for this.  Confirm with pt ok to wait until then.  If having loose stool, can remain off miralax until evaluated and then can determine what treatment needed.  If any change or worsening symptoms, let us know.

## 2017-08-18 ENCOUNTER — Encounter: Payer: Self-pay | Admitting: Internal Medicine

## 2017-08-18 ENCOUNTER — Ambulatory Visit (INDEPENDENT_AMBULATORY_CARE_PROVIDER_SITE_OTHER): Payer: Medicare Other | Admitting: Internal Medicine

## 2017-08-18 DIAGNOSIS — R195 Other fecal abnormalities: Secondary | ICD-10-CM | POA: Diagnosis not present

## 2017-08-18 DIAGNOSIS — I10 Essential (primary) hypertension: Secondary | ICD-10-CM | POA: Diagnosis not present

## 2017-08-18 DIAGNOSIS — E7800 Pure hypercholesterolemia, unspecified: Secondary | ICD-10-CM

## 2017-08-18 DIAGNOSIS — K2100 Gastro-esophageal reflux disease with esophagitis, without bleeding: Secondary | ICD-10-CM

## 2017-08-18 DIAGNOSIS — N183 Chronic kidney disease, stage 3 unspecified: Secondary | ICD-10-CM

## 2017-08-18 DIAGNOSIS — M545 Low back pain, unspecified: Secondary | ICD-10-CM

## 2017-08-18 DIAGNOSIS — Z23 Encounter for immunization: Secondary | ICD-10-CM | POA: Diagnosis not present

## 2017-08-18 DIAGNOSIS — F32A Depression, unspecified: Secondary | ICD-10-CM

## 2017-08-18 DIAGNOSIS — G8929 Other chronic pain: Secondary | ICD-10-CM

## 2017-08-18 DIAGNOSIS — F329 Major depressive disorder, single episode, unspecified: Secondary | ICD-10-CM | POA: Diagnosis not present

## 2017-08-18 NOTE — Progress Notes (Signed)
[name], female   DOB: 06/04/1927, 83 y.o.   MRN: [id number]   Subjective:    [name], female    DOB: 1927-06-15, 83 y.o.   MRN: [id number]  HPI  Patient here as a work in with some concerns regarding some loose stools.  She was seeing in the ER for chest pain and elevated blood pressure.  Was observed overnight on telemetry.  Ruled out for MI.  Cardiology evaluated.  No further w/up felt warranted.  No changes made in her blood pressure medication.  She saw Dr Rockey Situ 08/14/17.  See note.  Hydralazine adjusted.  Blood pressures doing better.  No chest pain.  No sob.  No acid reflux.  No abdominal pain.  Bowels moving.  Previously using miralax and magnesium regularly.  Now with some loose stool.  May have 2-3/day.  No blood.     Past Medical History:  Diagnosis Date  . Sherran Needs syndrome   . Diastolic CHF, acute ([facility])    Echo (8/10) with EF 60-65%, mild LVH, mild to moderate TR, PASP 40 mmHg.  . Dizziness    Thought to be side effect of Norvasc  . GERD (gastroesophageal reflux disease)   . Glaucoma   . HTN (hypertension)   . Hypercalcemia    On HCTZ  . Hypercholesterolemia   . Hyperkalemia   . Macular degeneration   . Osteoporosis   . Renal fibromuscular dysplasia ([facility])   . Renal insufficiency   . S/P cardiac catheterization 05/2002   Luminal irregularities only in the coronaries. EF 55%. There was some irregulatrity in the right renal artery suggestive of possible fibromuscular dysplasia. Myoview in 5/08 showed no ischemia or infarction. Lexiscan myoview at [facility] (10/10) showed EF 55%, normal wall motion, no evience ofor ischemia or infarction.  . Scoliosis associated with other condition    Chronic back pain  . Uterine cancer ([facility])    S/P hypsterectomy in 2008  . Wears hearing aid    bilateral   Past Surgical History:  Procedure Laterality Date  . AQUEOUS SHUNT Left 01/30/2017   Procedure: AQUEOUS SHUNT ahmed tube;  Surgeon: Ronnell Freshwater, MD;  Location: [facility];  Service: Ophthalmology;  Laterality: Left;  ahmed tube shunt with scleral patch graft  . CARDIAC CATHETERIZATION     Left  . CATARACT EXTRACTION Right 07/03/2013   [facility] Dr [facility] Going  . CATARACT EXTRACTION W/PHACO Left 01/30/2017   Procedure: CATARACT EXTRACTION PHACO AND INTRAOCULAR LENS PLACEMENT ([facility])  left;  Surgeon: Ronnell Freshwater, MD;  Location: [facility];  Service: Ophthalmology;  Laterality: Left;  . REFRACTIVE SURGERY    . TOTAL ABDOMINAL HYSTERECTOMY W/ BILATERAL SALPINGOOPHORECTOMY     stage I C well differentiated adenocarcinoma   Family History  Problem Relation Age of Onset  . Heart disease Father   . Heart disease Mother   . Parkinson's disease Mother   . Diabetes Cousin    Social History   Social History  . Marital status: Widowed    Spouse name: N/A  . Number of children: 2  . Years of education: N/A   Social History Main Topics  . Smoking status: Never Smoker  . Smokeless tobacco: Never Used  . Alcohol use No  . Drug use: No  . Sexual activity: Not Asked   Other Topics Concern  . None   Social History Narrative  . None    Outpatient Encounter Prescriptions as of 08/18/2017  Medication Sig  . amLODipine (NORVASC) 5 MG tablet Take 1 tablet (5 mg total) by mouth 2 (two) times daily as needed.  Marland Kitchen aspirin 81 MG EC tablet Take 81 mg by mouth daily.    . B Complex Vitamins (PA B-COMPLEX WITH B-12) TABS Take 1 tablet by mouth daily.    . Calcium Carbonate-Vitamin D (CALCIUM-VITAMIN D) 600-200 MG-UNIT CAPS Take by mouth. Takes 1/2 tablet daily.  . furosemide (LASIX) 40 MG tablet Take 1 tablet (40 mg total) by mouth daily.  . Glucosamine-Chondroitin 1500-1200 MG/30ML LIQD Take by mouth. Takes 1/2 tablet daily.  . hydrALAZINE (APRESOLINE) 100 MG tablet Take 1 tablet (100 mg total) by mouth 4 (four) times daily. (Patient taking differently: Take 100 mg by mouth 4 (four) times daily. )  .  latanoprost (XALATAN) 0.005 % ophthalmic solution Place 1 drop into the left eye at bedtime.   Marland Kitchen losartan (COZAAR) 100 MG tablet Take 1 tablet (100 mg total) by mouth daily.  . Magnesium 250 MG TABS Take 250 mg by mouth daily.  . Menthol, Topical Analgesic, (BIOFREEZE ROLL-ON EX) Apply 1 application topically daily.  . multivitamin (THERAGRAN) per tablet Take 1 tablet by mouth daily.    Marland Kitchen nystatin cream (MYCOSTATIN) Apply 1 application topically 2 (two) times daily.  . Polyethylene Glycol 3350 (MIRALAX PO) Take by mouth daily.   . ranitidine (ZANTAC) 150 MG tablet TAKE ONE TABLET BY MOUTH TWICE DAILY 10 TO 15 MIN BEFORE MEAL  . simvastatin (ZOCOR) 10 MG tablet Take 1 tablet (10 mg total) by mouth at bedtime.   No facility-administered encounter medications on file as of 08/18/2017.     Review of Systems  Constitutional: Negative for appetite change and unexpected weight change.  HENT: Negative for congestion and sinus pressure.   Respiratory: Negative for cough, chest tightness and shortness of breath.   Cardiovascular: Negative for chest pain and palpitations.  Gastrointestinal: Negative for abdominal pain, nausea and vomiting.       Loose stool as outlined.    Genitourinary: Negative for difficulty urinating and dysuria.  Musculoskeletal: Negative for myalgias.       Chronic back pain.    Skin: Negative for color change and rash.  Neurological: Negative for dizziness, light-headedness and headaches.  Psychiatric/Behavioral: Negative for agitation.       Increased stress.  Discussed with her today. Contemplating moving closer to her son or daughter.         Objective:    Physical Exam  Constitutional: She appears well-developed and well-nourished. No distress.  HENT:  Nose: Nose normal.  Mouth/Throat: Oropharynx is clear and moist.  Neck: Neck supple. No thyromegaly present.  Cardiovascular: Normal rate and regular rhythm.   Pulmonary/Chest: Breath sounds normal. No respiratory  distress. She has no wheezes.  Abdominal: Soft. Bowel sounds are normal. There is no tenderness.  Musculoskeletal: She exhibits no tenderness.  Stable edema.   Lymphadenopathy:    She has no cervical adenopathy.  Skin: No rash noted. No erythema.  Psychiatric: She has a normal mood and affect. Her behavior is normal.    BP 140/70 (BP Location: Left Arm, Patient Position: Sitting, Cuff Size: Normal)   Pulse 84   Temp 98.6 F (37 C) (Oral)   Resp 12   Ht 5\' 4"  (1.626 m)   Wt 127 lb 12.8 oz (58 kg)   SpO2 96%   BMI 21.94 kg/m  Wt Readings from Last 3 Encounters:  08/18/17 127 lb 12.8 oz (58 kg)  08/14/17 125 lb (56.7 kg)  08/12/17 130 lb (59 kg)     Lab Results  Component Value Date   WBC 9.1 08/13/2017   HGB 12.9 08/13/2017   HCT 36.9 08/13/2017   PLT 156 08/13/2017   GLUCOSE 99 08/13/2017   CHOL 169 08/13/2017   TRIG 81 08/13/2017   HDL 81 08/13/2017   LDLCALC 72 08/13/2017   ALT 20 03/20/2017   AST 26 03/20/2017   NA 139 08/13/2017   K 3.8 08/13/2017   CL 108 08/13/2017   CREATININE 1.05 (H) 08/13/2017   BUN 37 (H) 08/13/2017   CO2 24 08/13/2017   TSH 3.94 05/25/2016   HGBA1C 5.6 08/12/2017    Dg Chest 2 View  Result Date: 08/12/2017 CLINICAL DATA:  Acute presentation with chest pain. EXAM: CHEST  2 VIEW COMPARISON:  03/12/2015 FINDINGS: Heart size is normal. Ordinary aortic atherosclerosis. Large hiatal hernia. Pulmonary vascularity is normal. Lungs show mild chronic scarring but no evidence of active infiltrate, effusion or collapse. Old thoracic compression deformities with kyphotic curvature. IMPRESSION: No active cardiopulmonary disease. Large hiatal hernia. Thoracic compression fractures with spinal kyphotic curvature. Electronically Signed   By: Nelson Chimes M.D.   On: 08/12/2017 11:57   US Venous Img Lower Unilateral Left  Result Date: 08/12/2017 CLINICAL DATA:  Left lower extremity swelling for the past month. History of varicose veins. History of  uterine cancer. Evaluate for DVT. EXAM: LEFT LOWER EXTREMITY VENOUS DOPPLER ULTRASOUND TECHNIQUE: Gray-scale sonography with graded compression, as well as color Doppler and duplex ultrasound were performed to evaluate the lower extremity deep venous systems from the level of the common femoral vein and including the common femoral, femoral, profunda femoral, popliteal and calf veins including the posterior tibial, peroneal and gastrocnemius veins when visible. The superficial great saphenous vein was also interrogated. Spectral Doppler was utilized to evaluate flow at rest and with distal augmentation maneuvers in the common femoral, femoral and popliteal veins. COMPARISON:  None. FINDINGS: Contralateral Common Femoral Vein: Respiratory phasicity is normal and symmetric with the symptomatic side. No evidence of thrombus. Normal compressibility. Common Femoral Vein: No evidence of thrombus. Normal compressibility, respiratory phasicity and response to augmentation. Saphenofemoral Junction: No evidence of thrombus. Normal compressibility and flow on color Doppler imaging. Profunda Femoral Vein: No evidence of thrombus. Normal compressibility and flow on color Doppler imaging. Femoral Vein: No evidence of thrombus. Normal compressibility, respiratory phasicity and response to augmentation. Popliteal Vein: No evidence of thrombus. Normal compressibility, respiratory phasicity and response to augmentation. Calf Veins: No evidence of thrombus. Normal compressibility and flow on color Doppler imaging. Superficial Great Saphenous Vein: No evidence of thrombus. Normal compressibility and flow on color Doppler imaging. Venous Reflux:  None. Other Findings: Note is made of a trace amount of subcutaneous edema at the level of the left lower leg and ankle. IMPRESSION: No evidence of DVT within the left lower extremity. Electronically Signed   By: Sandi Mariscal M.D.   On: 08/12/2017 12:52       Assessment & Plan:   Problem  List Items Addressed This Visit    Chronic back pain    Followed by Dr Sharlet [place].        Chronic kidney disease (CKD), stage III (moderate)    Followed by nephrology.  Avoid antiinflammatories.        Depression    Overall relatively stable.  Follow.        GERD (gastroesophageal reflux disease)    Controlled on current regimen.  Follow.        Hypercholesterolemia    On simvastatin.  Follow lipid panel and liver function tests.        Hypertension    Blood pressure has been doing better.  Saw Dr Rockey Situ.  Same medication regimen.  Follow.        Loose stools    Some loose stools as outlined.  No abdominal pain.  Eating.  Start probiotics.  Fiber as directed.  Follow.  Notify me if persistent.         Other Visit Diagnoses    Encounter for immunization       Relevant Orders   Flu vaccine HIGH DOSE PF (Completed)       Einar Pheasant, MD

## 2017-08-18 NOTE — Patient Instructions (Signed)
benefiber - mix in your drink in the am - do this daily  Align - one per day  (this is a probiotic you can get over the counter).

## 2017-08-20 ENCOUNTER — Encounter: Payer: Self-pay | Admitting: Internal Medicine

## 2017-08-20 DIAGNOSIS — R195 Other fecal abnormalities: Secondary | ICD-10-CM | POA: Insufficient documentation

## 2017-08-20 NOTE — Assessment & Plan Note (Signed)
Followed by Dr Chasnis.   

## 2017-08-20 NOTE — Assessment & Plan Note (Signed)
Some loose stools as outlined.  No abdominal pain.  Eating.  Start probiotics.  Fiber as directed.  Follow.  Notify me if persistent.

## 2017-08-20 NOTE — Assessment & Plan Note (Signed)
On simvastatin.  Follow lipid panel and liver function tests.   

## 2017-08-20 NOTE — Assessment & Plan Note (Signed)
Controlled on current regimen.  Follow.  

## 2017-08-20 NOTE — Assessment & Plan Note (Signed)
Followed by nephrology.  Avoid antiinflammatories.

## 2017-08-20 NOTE — Assessment & Plan Note (Signed)
Overall relatively stable.  Follow.  ?

## 2017-08-20 NOTE — Assessment & Plan Note (Signed)
Blood pressure has been doing better.  Saw Dr Rockey Situ.  Same medication regimen.  Follow.

## 2017-09-14 ENCOUNTER — Telehealth: Payer: Self-pay | Admitting: Internal Medicine

## 2017-09-14 NOTE — Telephone Encounter (Signed)
Yes.  If she is having regular bowel movements she can continue.

## 2017-09-14 NOTE — Telephone Encounter (Signed)
Patient states that she has been taking for a month. She wanted to know if she needed to continue to take it because she has been taking stool softener Align Miralax. States that if she is not taking she has normal bowel movements if she does not take for as day she starts having constipation.

## 2017-09-14 NOTE — Telephone Encounter (Signed)
Left message to return call to our office.  

## 2017-09-14 NOTE — Telephone Encounter (Signed)
Pt wanted to know if she could continue with the fiber-Miralax. Please advise

## 2017-09-15 ENCOUNTER — Other Ambulatory Visit: Payer: Self-pay | Admitting: Internal Medicine

## 2017-09-15 NOTE — Telephone Encounter (Signed)
Patient called back information given.

## 2017-10-02 ENCOUNTER — Other Ambulatory Visit: Payer: Self-pay | Admitting: Internal Medicine

## 2017-10-03 ENCOUNTER — Other Ambulatory Visit: Payer: Self-pay

## 2017-10-03 ENCOUNTER — Telehealth: Payer: Self-pay

## 2017-10-03 MED ORDER — PANTOPRAZOLE SODIUM 40 MG PO TBEC: 40.0000 mg | DELAYED_RELEASE_TABLET | Freq: Every day | ORAL | 3 refills | Status: DC

## 2017-10-03 NOTE — Telephone Encounter (Signed)
Called she still needs have sent to pharmacy.

## 2017-10-03 NOTE — Telephone Encounter (Signed)
Pharmacy faxed refill request for pantoprazol 40mg  #30 3 refills. Got denied because it was taken off med list. Last refilled 06/06/17. Should pt still be on this?

## 2017-10-03 NOTE — Telephone Encounter (Signed)
Per chart review.  We refilled the medication in 05/2017 with 3 refills.  Not sure why taken off.  Please call pt and confirm still taking and add back to list and ok to refill x 4.

## 2017-10-18 ENCOUNTER — Other Ambulatory Visit: Payer: Self-pay | Admitting: Physician Assistant

## 2017-12-05 ENCOUNTER — Encounter: Payer: Self-pay | Admitting: Internal Medicine

## 2017-12-05 ENCOUNTER — Ambulatory Visit: Payer: Medicare Other | Admitting: Internal Medicine

## 2017-12-05 DIAGNOSIS — R441 Visual hallucinations: Secondary | ICD-10-CM

## 2017-12-05 DIAGNOSIS — F329 Major depressive disorder, single episode, unspecified: Secondary | ICD-10-CM | POA: Diagnosis not present

## 2017-12-05 DIAGNOSIS — I10 Essential (primary) hypertension: Secondary | ICD-10-CM | POA: Diagnosis not present

## 2017-12-05 DIAGNOSIS — N183 Chronic kidney disease, stage 3 unspecified: Secondary | ICD-10-CM

## 2017-12-05 DIAGNOSIS — R590 Localized enlarged lymph nodes: Secondary | ICD-10-CM

## 2017-12-05 DIAGNOSIS — G8929 Other chronic pain: Secondary | ICD-10-CM

## 2017-12-05 DIAGNOSIS — E7800 Pure hypercholesterolemia, unspecified: Secondary | ICD-10-CM

## 2017-12-05 DIAGNOSIS — I5032 Chronic diastolic (congestive) heart failure: Secondary | ICD-10-CM

## 2017-12-05 DIAGNOSIS — K2100 Gastro-esophageal reflux disease with esophagitis, without bleeding: Secondary | ICD-10-CM

## 2017-12-05 DIAGNOSIS — F32A Depression, unspecified: Secondary | ICD-10-CM

## 2017-12-05 DIAGNOSIS — Z8542 Personal history of malignant neoplasm of other parts of uterus: Secondary | ICD-10-CM | POA: Diagnosis not present

## 2017-12-05 DIAGNOSIS — M545 Low back pain, unspecified: Secondary | ICD-10-CM

## 2017-12-05 NOTE — Progress Notes (Signed)
[name], female   DOB: 1927/02/17, 84 y.o.   MRN: [id number]   Subjective:    [name], female    DOB: [birth year], 84 y.o.   MRN: [id number]  HPI  Patient here for a scheduled follow up. States overall she is doing better.  Bowels are better.  No abdominal pain.  Had one episode where she noticed blood.  She feels was related to possible hemorrhoid.  No vaginal bleeding.  No further rectal bleeding.  Minimal amount.  Discussed further evaluation, including rectal exam, stool cards and GI evaluation.  She declines.  Wants to monitor.  No chest pain.  Breathing stable.  Blood pressures averaging 010-272Z (to 366) systolic.  Seeing nephrology.  Plans to f/u this week.  She is eating.  No vomiting.  Still with increased stress, but overall doing better.  Still trying to cope with her husband's death.     Past Medical History:  Diagnosis Date  . Sherran Needs syndrome   . Diastolic CHF, acute (HCC)    Echo (8/10) with EF 60-65%, mild LVH, mild to moderate TR, PASP 40 mmHg.  . Dizziness    Thought to be side effect of Norvasc  . GERD (gastroesophageal reflux disease)   . Glaucoma   . HTN (hypertension)   . Hypercalcemia    On HCTZ  . Hypercholesterolemia   . Hyperkalemia   . Macular degeneration   . Osteoporosis   . Renal fibromuscular dysplasia ([place])   . Renal insufficiency   . S/P cardiac catheterization 05/2002   Luminal irregularities only in the coronaries. EF 55%. There was some irregulatrity in the right renal artery suggestive of possible fibromuscular dysplasia. Myoview in 5/08 showed no ischemia or infarction. Lexiscan myoview at [facility] (10/10) showed EF 55%, normal wall motion, no evience ofor ischemia or infarction.  . Scoliosis associated with other condition    Chronic back pain  . Uterine cancer ([place])    S/P hypsterectomy in 2008  . Wears hearing aid    bilateral   Past Surgical History:  Procedure Laterality Date  . AQUEOUS  SHUNT Left 01/30/2017   Procedure: AQUEOUS SHUNT ahmed tube;  Surgeon: Ronnell Freshwater, MD;  Location: [place];  Service: Ophthalmology;  Laterality: Left;  ahmed tube shunt with scleral patch graft  . CARDIAC CATHETERIZATION     Left  . CATARACT EXTRACTION Right 07/03/2013   [facility] Dr Wallace Going  . CATARACT EXTRACTION W/PHACO Left 01/30/2017   Procedure: CATARACT EXTRACTION PHACO AND INTRAOCULAR LENS PLACEMENT ([place])  left;  Surgeon: Ronnell Freshwater, MD;  Location: [place];  Service: Ophthalmology;  Laterality: Left;  . REFRACTIVE SURGERY    . TOTAL ABDOMINAL HYSTERECTOMY W/ BILATERAL SALPINGOOPHORECTOMY     stage I C well differentiated adenocarcinoma   Family History  Problem Relation Age of Onset  . Heart disease Father   . Heart disease Mother   . Parkinson's disease Mother   . Diabetes Cousin    Social History   Socioeconomic History  . Marital status: Widowed    Spouse name: None  . Number of children: 2  . Years of education: None  . Highest education level: None  Social Needs  . Financial resource strain: None  . Food insecurity - worry: None  . Food insecurity - inability: None  . Transportation needs - medical: None  . Transportation needs - non-medical: None  Occupational History  . None  Tobacco Use  .  Smoking status: Never Smoker  . Smokeless tobacco: Never Used  Substance and Sexual Activity  . Alcohol use: No    Alcohol/week: 0.0 oz  . Drug use: No  . Sexual activity: None  Other Topics Concern  . None  Social History Narrative  . None    Outpatient Encounter Medications as of 12/05/2017  Medication Sig  . amLODipine (NORVASC) 5 MG tablet Take 1 tablet (5 mg total) by mouth 2 (two) times daily as needed.  Marland Kitchen aspirin 81 MG EC tablet Take 81 mg by mouth daily.    . B Complex Vitamins (PA B-COMPLEX WITH B-12) TABS Take 1 tablet by mouth daily.    . Calcium Carbonate-Vitamin D (CALCIUM-VITAMIN D) 600-200 MG-UNIT CAPS  Take by mouth. Takes 1/2 tablet daily.  . furosemide (LASIX) 40 MG tablet TAKE ONE TABLET EVERY DAY  . Glucosamine-Chondroitin 1500-1200 MG/30ML LIQD Take by mouth. Takes 1/2 tablet daily.  . hydrALAZINE (APRESOLINE) 100 MG tablet Take 1 tablet (100 mg total) by mouth 4 (four) times daily. (Patient taking differently: Take 100 mg by mouth 4 (four) times daily. )  . latanoprost (XALATAN) 0.005 % ophthalmic solution Place 1 drop into the left eye at bedtime.   Marland Kitchen losartan (COZAAR) 100 MG tablet Take 1 tablet (100 mg total) by mouth daily.  . Magnesium 250 MG TABS Take 250 mg by mouth daily.  . Menthol, Topical Analgesic, (BIOFREEZE ROLL-ON EX) Apply 1 application topically daily.  . multivitamin (THERAGRAN) per tablet Take 1 tablet by mouth daily.    Marland Kitchen nystatin cream (MYCOSTATIN) Apply 1 application topically 2 (two) times daily.  . pantoprazole (PROTONIX) 40 MG tablet Take 1 tablet (40 mg total) daily by mouth.  . Polyethylene Glycol 3350 (MIRALAX PO) Take by mouth daily.   . ranitidine (ZANTAC) 150 MG tablet TAKE ONE TABLET BY MOUTH TWICE DAILY 10 TO 15 MIN BEFORE MEAL  . simvastatin (ZOCOR) 10 MG tablet Take 1 tablet (10 mg total) by mouth at bedtime.   No facility-administered encounter medications on file as of 12/05/2017.     Review of Systems  Constitutional: Negative for appetite change and unexpected weight change.  HENT: Negative for congestion and sinus pressure.   Respiratory: Negative for cough, chest tightness and shortness of breath.   Cardiovascular: Negative for chest pain and palpitations.       Lower extremity swelling.  Left > right.  Relatively stable.    Gastrointestinal: Negative for abdominal pain, diarrhea, nausea and vomiting.  Genitourinary: Negative for difficulty urinating and dysuria.  Musculoskeletal: Positive for back pain. Negative for myalgias.  Skin: Negative for color change and rash.  Neurological: Negative for dizziness, light-headedness and headaches.    Psychiatric/Behavioral: Negative for agitation and dysphoric mood.       Increased stress as outlined.         Objective:     Blood pressure rechecked by me:  142/62  Physical Exam  Constitutional: She appears well-developed and well-nourished. No distress.  HENT:  Nose: Nose normal.  Mouth/Throat: Oropharynx is clear and moist.  Neck: Neck supple. No thyromegaly present.  Palpable lymph nodes - left.    Cardiovascular: Normal rate and regular rhythm.  Pulmonary/Chest: Breath sounds normal. No respiratory distress. She has no wheezes.  Abdominal: Soft. Bowel sounds are normal. There is no tenderness.  Musculoskeletal: She exhibits edema. She exhibits no tenderness.  Relatively stable.   Skin: No rash noted. No erythema.  Psychiatric: She has a normal mood and affect. Her  behavior is normal.    BP 128/78   Pulse 77   Temp (!) 97.5 F (36.4 C) (Oral)   Ht 5\' 4"  (1.626 m)   Wt 130 lb 3.2 oz (59.1 kg)   SpO2 97%   BMI 22.35 kg/m  Wt Readings from Last 3 Encounters:  12/05/17 130 lb 3.2 oz (59.1 kg)  08/18/17 127 lb 12.8 oz (58 kg)  08/14/17 125 lb (56.7 kg)     Lab Results  Component Value Date   WBC 9.1 08/13/2017   HGB 12.9 08/13/2017   HCT 36.9 08/13/2017   PLT 156 08/13/2017   GLUCOSE 99 08/13/2017   CHOL 169 08/13/2017   TRIG 81 08/13/2017   HDL 81 08/13/2017   LDLCALC 72 08/13/2017   ALT 20 03/20/2017   AST 26 03/20/2017   NA 139 08/13/2017   K 3.8 08/13/2017   CL 108 08/13/2017   CREATININE 1.05 (H) 08/13/2017   BUN 37 (H) 08/13/2017   CO2 24 08/13/2017   TSH 3.94 05/25/2016   HGBA1C 5.6 08/12/2017    Dg Chest 2 View  Result Date: 08/12/2017 CLINICAL DATA:  Acute presentation with chest pain. EXAM: CHEST  2 VIEW COMPARISON:  03/12/2015 FINDINGS: Heart size is normal. Ordinary aortic atherosclerosis. Large hiatal hernia. Pulmonary vascularity is normal. Lungs show mild chronic scarring but no evidence of active infiltrate, effusion or collapse.  Old thoracic compression deformities with kyphotic curvature. IMPRESSION: No active cardiopulmonary disease. Large hiatal hernia. Thoracic compression fractures with spinal kyphotic curvature. Electronically Signed   By: Nelson Chimes M.D.   On: 08/12/2017 11:57   US Venous Img Lower Unilateral Left  Result Date: 08/12/2017 CLINICAL DATA:  Left lower extremity swelling for the past month. History of varicose veins. History of uterine cancer. Evaluate for DVT. EXAM: LEFT LOWER EXTREMITY VENOUS DOPPLER ULTRASOUND TECHNIQUE: Gray-scale sonography with graded compression, as well as color Doppler and duplex ultrasound were performed to evaluate the lower extremity deep venous systems from the level of the common femoral vein and including the common femoral, femoral, profunda femoral, popliteal and calf veins including the posterior tibial, peroneal and gastrocnemius veins when visible. The superficial great saphenous vein was also interrogated. Spectral Doppler was utilized to evaluate flow at rest and with distal augmentation maneuvers in the common femoral, femoral and popliteal veins. COMPARISON:  None. FINDINGS: Contralateral Common Femoral Vein: Respiratory phasicity is normal and symmetric with the symptomatic side. No evidence of thrombus. Normal compressibility. Common Femoral Vein: No evidence of thrombus. Normal compressibility, respiratory phasicity and response to augmentation. Saphenofemoral Junction: No evidence of thrombus. Normal compressibility and flow on color Doppler imaging. Profunda Femoral Vein: No evidence of thrombus. Normal compressibility and flow on color Doppler imaging. Femoral Vein: No evidence of thrombus. Normal compressibility, respiratory phasicity and response to augmentation. Popliteal Vein: No evidence of thrombus. Normal compressibility, respiratory phasicity and response to augmentation. Calf Veins: No evidence of thrombus. Normal compressibility and flow on color Doppler  imaging. Superficial Great Saphenous Vein: No evidence of thrombus. Normal compressibility and flow on color Doppler imaging. Venous Reflux:  None. Other Findings: Note is made of a trace amount of subcutaneous edema at the level of the left lower leg and ankle. IMPRESSION: No evidence of DVT within the left lower extremity. Electronically Signed   By: Sandi Mariscal M.D.   On: 08/12/2017 12:52       Assessment & Plan:   Problem List Items Addressed This Visit    Cervical lymphadenopathy  Palpable lymph nodes on left.  Persistent.  No increased sinus congestion, ear ache, sore throat, etc.  Discussed CT neck.  She declines.  Did agree to referral to ENT.        Relevant Orders   Ambulatory referral to ENT   Chronic back pain    Followed by Dr Sharlet Salina.       Chronic diastolic CHF (congestive heart failure) (HCC)    Overall has been stable.  Breathing stable.  Some stable lower extremity swelling.  Compression hose.  Leg elevation.  Follow.  If persistent issues, may need referral to vascular surgery.        Chronic kidney disease (CKD), stage III (moderate) (HCC)    Followed by nephrology.       Depression    Doing better.  Discussed with her today.  Follow.       GERD (gastroesophageal reflux disease)    Controlled on current regimen.  Follow.       History of endometrial cancer    Was followed by gyn.        Hypercholesterolemia    On simvastatin.  Low cholesterol diet and exercise.  Follow lipid panel and liver function tests.        Hypertension    Blood pressures as outlined.  Continue same medication regimen for now.  Due to see nephrology this week.  They are adjusting medication.  Follow metabolic panel.  To have labs at nephrology.        Visual hallucinations    Has seen neurology.  Stable.            Einar Pheasant, MD

## 2017-12-05 NOTE — Progress Notes (Signed)
Pre visit review using our clinic review tool, if applicable. No additional management support is needed unless otherwise documented below in the visit note. 

## 2017-12-08 ENCOUNTER — Encounter: Payer: Self-pay | Admitting: Internal Medicine

## 2017-12-08 DIAGNOSIS — R590 Localized enlarged lymph nodes: Secondary | ICD-10-CM | POA: Insufficient documentation

## 2017-12-08 LAB — BASIC METABOLIC PANEL
BUN: 42 — AB (ref 4–21)
Creatinine: 1.6 — AB (ref 0.5–1.1)
GLUCOSE: 94
POTASSIUM: 4.8 (ref 3.4–5.3)
Sodium: 139 (ref 137–147)

## 2017-12-08 NOTE — Assessment & Plan Note (Signed)
Blood pressures as outlined.  Continue same medication regimen for now.  Due to see nephrology this week.  They are adjusting medication.  Follow metabolic panel.  To have labs at nephrology.

## 2017-12-08 NOTE — Assessment & Plan Note (Signed)
Has seen neurology.  Stable.  

## 2017-12-08 NOTE — Assessment & Plan Note (Signed)
Doing better.  Discussed with her today.  Follow.

## 2017-12-08 NOTE — Assessment & Plan Note (Signed)
On simvastatin.  Low cholesterol diet and exercise.  Follow lipid panel and liver function tests.   

## 2017-12-08 NOTE — Assessment & Plan Note (Signed)
Followed by nephrology. 

## 2017-12-08 NOTE — Assessment & Plan Note (Signed)
Palpable lymph nodes on left.  Persistent.  No increased sinus congestion, ear ache, sore throat, etc.  Discussed CT neck.  She declines.  Did agree to referral to ENT.

## 2017-12-08 NOTE — Assessment & Plan Note (Signed)
Controlled on current regimen.  Follow.  

## 2017-12-08 NOTE — Assessment & Plan Note (Signed)
Followed by Dr Chasnis.   

## 2017-12-08 NOTE — Assessment & Plan Note (Signed)
Was followed by gyn.

## 2017-12-08 NOTE — Assessment & Plan Note (Signed)
Overall has been stable.  Breathing stable.  Some stable lower extremity swelling.  Compression hose.  Leg elevation.  Follow.  If persistent issues, may need referral to vascular surgery.

## 2017-12-13 ENCOUNTER — Encounter: Payer: Self-pay | Admitting: Internal Medicine

## 2018-01-16 ENCOUNTER — Other Ambulatory Visit: Payer: Self-pay | Admitting: Cardiovascular Disease

## 2018-01-16 NOTE — Telephone Encounter (Signed)
Please call the patient to clarify how she is taking this. Hydralazine is typically not 4 times a day, so I'm wondering if she takes it 3 times day and the 4th pill is to take as needed.

## 2018-01-16 NOTE — Telephone Encounter (Signed)
Please advise quantity to refill for. Last instruction states to take extra 1/2 tab for BP >170. How many extra tabs per month should be sent? Thanks.

## 2018-01-17 NOTE — Telephone Encounter (Signed)
NA x 1 @ 4:12. LMOM to call our office back regarding medication clarification.

## 2018-01-19 NOTE — Telephone Encounter (Signed)
LMOM to contact our office regarding her medications.

## 2018-01-22 NOTE — Telephone Encounter (Signed)
Patient has Hydralazine 100 mg takes 50 mg tablet in the am & 100 mg tablet in the pm. Patient would like Rx sent to [facility] pharmacy.

## 2018-02-05 ENCOUNTER — Other Ambulatory Visit: Payer: Self-pay | Admitting: Internal Medicine

## 2018-02-12 ENCOUNTER — Ambulatory Visit: Payer: Self-pay | Admitting: *Deleted

## 2018-02-12 NOTE — Telephone Encounter (Signed)
Pt has rash on top of foot and on finger. For two weeks has been using Clotrimazole 1% for 2 weeks. Not going away.

## 2018-02-12 NOTE — Telephone Encounter (Signed)
Patient is calling with a rash on her foot and finger that she has been treating as ring worm for a couple week. It is better but not going away. Appointment scheduled. Reason for Disposition . Localized rash present > 7 days  Answer Assessment - Initial Assessment Questions 1. APPEARANCE of RASH: "Describe the rash."      Rough and round- patient can not see well 2. LOCATION: "Where is the rash located?"      Top on R foot and L hand- middle finger 3. NUMBER: "How many spots are there?"      2 areas- maybe spread on foot 4. SIZE: "How big are the spots?" (Inches, centimeters or compare to size of a coin)      Area on foot is half dollar size 5. ONSET: "When did the rash start?"      2 weeks ago 6. ITCHING: "Does the rash itch?" If so, ask: "How bad is the itch?"  (Scale 1-10; or mild, moderate, severe)     Yes- clotrimazole 1% 7. PAIN: "Does the rash hurt?" If so, ask: "How bad is the pain?"  (Scale 1-10; or mild, moderate, severe)     no 8. OTHER SYMPTOMS: "Do you have any other symptoms?" (e.g., fever)     no 9. PREGNANCY: "Is there any chance you are pregnant?" "When was your last menstrual period?"     n/a  Protocols used: RASH OR REDNESS - LOCALIZED-A-AH

## 2018-02-13 ENCOUNTER — Ambulatory Visit: Payer: Medicare Other | Admitting: Internal Medicine

## 2018-02-13 ENCOUNTER — Encounter: Payer: Self-pay | Admitting: Internal Medicine

## 2018-02-13 VITALS — BP 152/84 | HR 78 | Temp 97.4°F | Resp 18 | Wt 132.4 lb

## 2018-02-13 DIAGNOSIS — R21 Rash and other nonspecific skin eruption: Secondary | ICD-10-CM

## 2018-02-13 DIAGNOSIS — I10 Essential (primary) hypertension: Secondary | ICD-10-CM | POA: Diagnosis not present

## 2018-02-13 MED ORDER — NYSTATIN 100000 UNIT/GM EX POWD: Freq: Two times a day (BID) | CUTANEOUS | 0 refills | Status: DC

## 2018-02-13 MED ORDER — CLOTRIMAZOLE 1 % EX CREA: 1.0000 "application " | TOPICAL_CREAM | Freq: Two times a day (BID) | CUTANEOUS | 0 refills | Status: DC

## 2018-02-13 NOTE — Progress Notes (Addendum)
[name], female   DOB: [birth year], 84 y.o.   MRN: [id number]   Subjective:    [name], female    DOB: 07-23-1927, 84 y.o.   MRN: [id number]  HPI  Patient here for as a work in with concerns regarding rash.  She reports noticing rash on third finger and a localized patch on her right foot.  Some itching.  No other rash.  No fever.  Eating. No nausea or vomiting.  Bowels moving.  still with increased stress in coping with her husband's death.  Overall doing better.      Past Medical History:  Diagnosis Date  . Sherran Needs syndrome   . Diastolic CHF, acute (HCC)    Echo (8/10) with EF 60-65%, mild LVH, mild to moderate TR, PASP 40 mmHg.  . Dizziness    Thought to be side effect of Norvasc  . GERD (gastroesophageal reflux disease)   . Glaucoma   . HTN (hypertension)   . Hypercalcemia    On HCTZ  . Hypercholesterolemia   . Hyperkalemia   . Macular degeneration   . Osteoporosis   . Renal fibromuscular dysplasia ([place])   . Renal insufficiency   . S/P cardiac catheterization 05/2002   Luminal irregularities only in the coronaries. EF 55%. There was some irregulatrity in the right renal artery suggestive of possible fibromuscular dysplasia. Myoview in 5/08 showed no ischemia or infarction. Lexiscan myoview at [facility] (10/10) showed EF 55%, normal wall motion, no evience ofor ischemia or infarction.  . Scoliosis associated with other condition    Chronic back pain  . Uterine cancer ([place])    S/P hypsterectomy in 2008  . Wears hearing aid    bilateral   Past Surgical History:  Procedure Laterality Date  . AQUEOUS SHUNT Left 01/30/2017   Procedure: AQUEOUS SHUNT ahmed tube;  Surgeon: Ronnell Freshwater, MD;  Location: [place];  Service: Ophthalmology;  Laterality: Left;  ahmed tube shunt with scleral patch graft  . CARDIAC CATHETERIZATION     Left  . CATARACT EXTRACTION Right 07/03/2013   [facility] Dr Wallace Going  . CATARACT  EXTRACTION W/PHACO Left 01/30/2017   Procedure: CATARACT EXTRACTION PHACO AND INTRAOCULAR LENS PLACEMENT ([place])  left;  Surgeon: Ronnell Freshwater, MD;  Location: [place];  Service: Ophthalmology;  Laterality: Left;  . REFRACTIVE SURGERY    . TOTAL ABDOMINAL HYSTERECTOMY W/ BILATERAL SALPINGOOPHORECTOMY     stage I C well differentiated adenocarcinoma   Family History  Problem Relation Age of Onset  . Heart disease Father   . Heart disease Mother   . Parkinson's disease Mother   . Diabetes Cousin    Social History   Socioeconomic History  . Marital status: Widowed    Spouse name: Not on file  . Number of children: 2  . Years of education: Not on file  . Highest education level: Not on file  Occupational History  . Not on file  Social Needs  . Financial resource strain: Not on file  . Food insecurity:    Worry: Not on file    Inability: Not on file  . Transportation needs:    Medical: Not on file    Non-medical: Not on file  Tobacco Use  . Smoking status: Never Smoker  . Smokeless tobacco: Never Used  Substance and Sexual Activity  . Alcohol use: No    Alcohol/week: 0.0 oz  . Drug use: No  . Sexual activity: Not on  file  Lifestyle  . Physical activity:    Days per week: Not on file    Minutes per session: Not on file  . Stress: Not on file  Relationships  . Social connections:    Talks on phone: Not on file    Gets together: Not on file    Attends religious service: Not on file    Active member of club or organization: Not on file    Attends meetings of clubs or organizations: Not on file    Relationship status: Not on file  Other Topics Concern  . Not on file  Social History Narrative  . Not on file    Outpatient Encounter Medications as of 02/13/2018  Medication Sig  . amLODipine (NORVASC) 5 MG tablet Take 1 tablet (5 mg total) by mouth 2 (two) times daily as needed.  Marland Kitchen aspirin 81 MG EC tablet Take 81 mg by mouth daily.    . B Complex  Vitamins (PA B-COMPLEX WITH B-12) TABS Take 1 tablet by mouth daily.    . Calcium Carbonate-Vitamin D (CALCIUM-VITAMIN D) 600-200 MG-UNIT CAPS Take by mouth. Takes 1/2 tablet daily.  . clotrimazole (LOTRIMIN AF) 1 % cream Apply 1 application topically 2 (two) times daily. Apply to foot and finger  . furosemide (LASIX) 40 MG tablet TAKE ONE TABLET EVERY DAY  . Glucosamine-Chondroitin 1500-1200 MG/30ML LIQD Take by mouth. Takes 1/2 tablet daily.  . hydrALAZINE (APRESOLINE) 100 MG tablet Take 100 mg 1/2 tablet every morning and 100 mg tablet every evening.  . latanoprost (XALATAN) 0.005 % ophthalmic solution Place 1 drop into the left eye at bedtime.   Marland Kitchen losartan (COZAAR) 100 MG tablet Take 1 tablet (100 mg total) by mouth daily.  . Magnesium 250 MG TABS Take 250 mg by mouth daily.  . Menthol, Topical Analgesic, (BIOFREEZE ROLL-ON EX) Apply 1 application topically daily.  . multivitamin (THERAGRAN) per tablet Take 1 tablet by mouth daily.    Marland Kitchen nystatin (NYSTATIN) powder Apply topically 2 (two) times daily. Apply to affected area under breast twice a day  . nystatin cream (MYCOSTATIN) Apply 1 application topically 2 (two) times daily.  . pantoprazole (PROTONIX) 40 MG tablet TAKE ONE TABLET EVERY DAY  . Polyethylene Glycol 3350 (MIRALAX PO) Take by mouth daily.   . ranitidine (ZANTAC) 150 MG tablet TAKE ONE TABLET BY MOUTH TWICE DAILY 10 TO 15 MIN BEFORE MEAL  . simvastatin (ZOCOR) 10 MG tablet Take 1 tablet (10 mg total) by mouth at bedtime.   No facility-administered encounter medications on file as of 02/13/2018.     Review of Systems  Constitutional: Negative for appetite change and unexpected weight change.  Respiratory: Negative for cough and shortness of breath.   Gastrointestinal: Negative for diarrhea, nausea and vomiting.  Musculoskeletal: Negative for joint swelling and myalgias.  Skin: Positive for rash. Negative for wound.  Neurological: Negative for headaches.    Psychiatric/Behavioral: Negative for agitation.       Increased stress as outlined.  Doing better.         Objective:     Blood pressure rechecked by me;  152/84  Physical Exam  Constitutional: She appears well-developed and well-nourished. No distress.  Neck: Neck supple.  Cardiovascular: Normal rate and regular rhythm.  Pulmonary/Chest: Breath sounds normal. No respiratory distress. She has no wheezes.  Abdominal: Soft. Bowel sounds are normal. There is no tenderness.  Musculoskeletal: She exhibits no edema or tenderness.  Lymphadenopathy:    She has no cervical  adenopathy.  Skin:  Small circular erythematous based rash - third finger and right foot.  No other rash.      BP (!) 152/84   Pulse 78   Temp (!) 97.4 F (36.3 C) (Oral)   Resp 18   Wt 132 lb 6.4 oz (60.1 kg)   SpO2 98%   BMI 22.73 kg/m  Wt Readings from Last 3 Encounters:  02/13/18 132 lb 6.4 oz (60.1 kg)  12/05/17 130 lb 3.2 oz (59.1 kg)  08/18/17 127 lb 12.8 oz (58 kg)     Lab Results  Component Value Date   WBC 9.1 08/13/2017   HGB 12.9 08/13/2017   HCT 36.9 08/13/2017   PLT 156 08/13/2017   GLUCOSE 99 08/13/2017   CHOL 169 08/13/2017   TRIG 81 08/13/2017   HDL 81 08/13/2017   LDLCALC 72 08/13/2017   ALT 20 03/20/2017   AST 26 03/20/2017   NA 139 12/08/2017   K 4.8 12/08/2017   CL 108 08/13/2017   CREATININE 1.6 (A) 12/08/2017   BUN 42 (A) 12/08/2017   CO2 24 08/13/2017   TSH 3.94 05/25/2016   HGBA1C 5.6 08/12/2017    Dg Chest 2 View  Result Date: 08/12/2017 CLINICAL DATA:  Acute presentation with chest pain. EXAM: CHEST  2 VIEW COMPARISON:  03/12/2015 FINDINGS: Heart size is normal. Ordinary aortic atherosclerosis. Large hiatal hernia. Pulmonary vascularity is normal. Lungs show mild chronic scarring but no evidence of active infiltrate, effusion or collapse. Old thoracic compression deformities with kyphotic curvature. IMPRESSION: No active cardiopulmonary disease. Large hiatal hernia.  Thoracic compression fractures with spinal kyphotic curvature. Electronically Signed   By: Nelson Chimes M.D.   On: 08/12/2017 11:57   US Venous Img Lower Unilateral Left  Result Date: 08/12/2017 CLINICAL DATA:  Left lower extremity swelling for the past month. History of varicose veins. History of uterine cancer. Evaluate for DVT. EXAM: LEFT LOWER EXTREMITY VENOUS DOPPLER ULTRASOUND TECHNIQUE: Gray-scale sonography with graded compression, as well as color Doppler and duplex ultrasound were performed to evaluate the lower extremity deep venous systems from the level of the common femoral vein and including the common femoral, femoral, profunda femoral, popliteal and calf veins including the posterior tibial, peroneal and gastrocnemius veins when visible. The superficial great saphenous vein was also interrogated. Spectral Doppler was utilized to evaluate flow at rest and with distal augmentation maneuvers in the common femoral, femoral and popliteal veins. COMPARISON:  None. FINDINGS: Contralateral Common Femoral Vein: Respiratory phasicity is normal and symmetric with the symptomatic side. No evidence of thrombus. Normal compressibility. Common Femoral Vein: No evidence of thrombus. Normal compressibility, respiratory phasicity and response to augmentation. Saphenofemoral Junction: No evidence of thrombus. Normal compressibility and flow on color Doppler imaging. Profunda Femoral Vein: No evidence of thrombus. Normal compressibility and flow on color Doppler imaging. Femoral Vein: No evidence of thrombus. Normal compressibility, respiratory phasicity and response to augmentation. Popliteal Vein: No evidence of thrombus. Normal compressibility, respiratory phasicity and response to augmentation. Calf Veins: No evidence of thrombus. Normal compressibility and flow on color Doppler imaging. Superficial Great Saphenous Vein: No evidence of thrombus. Normal compressibility and flow on color Doppler imaging. Venous  Reflux:  None. Other Findings: Note is made of a trace amount of subcutaneous edema at the level of the left lower leg and ankle. IMPRESSION: No evidence of DVT within the left lower extremity. Electronically Signed   By: Sandi Mariscal M.D.   On: 08/12/2017 12:52  Assessment & Plan:   Problem List Items Addressed This Visit    Hypertension    Blood pressure as outlined.  Same medication.  Follow.        Other Visit Diagnoses    Rash    -  Primary   rash as outlined.  apply lotrimin cream to affected area.  notify me of persistent problem.  Persistent rash under breast.  Nystatin powder and cream.        Einar Pheasant, MD

## 2018-02-13 NOTE — Patient Instructions (Signed)
Apply the nystatin cream and powder - each twice a day (alternating)  Apply the lotrisone cream twice a day to finger and foot - twice a day

## 2018-02-16 ENCOUNTER — Encounter: Payer: Self-pay | Admitting: Internal Medicine

## 2018-02-16 NOTE — Assessment & Plan Note (Signed)
Blood pressure as outlined.  Same medication.  Follow.

## 2018-03-31 ENCOUNTER — Other Ambulatory Visit: Payer: Self-pay | Admitting: Internal Medicine

## 2018-04-06 NOTE — Telephone Encounter (Signed)
Ok to send

## 2018-04-09 NOTE — Telephone Encounter (Signed)
I had given her this for acute issue.  Does she still need?

## 2018-04-13 NOTE — Telephone Encounter (Signed)
Patient staed the finger and foot still itch and the skin is still rough in the same area.

## 2018-04-13 NOTE — Telephone Encounter (Signed)
Left message for patient to return call to office. 

## 2018-04-16 NOTE — Telephone Encounter (Signed)
I have refilled the cream x 1.  If persistent issues, will need to see dermatology.

## 2018-05-02 ENCOUNTER — Ambulatory Visit (INDEPENDENT_AMBULATORY_CARE_PROVIDER_SITE_OTHER): Payer: Medicare Other

## 2018-05-02 VITALS — BP 138/78 | HR 74 | Temp 98.0°F | Resp 15 | Ht 64.0 in | Wt 130.8 lb

## 2018-05-02 DIAGNOSIS — Z0000 Encounter for general adult medical examination without abnormal findings: Secondary | ICD-10-CM | POA: Diagnosis not present

## 2018-05-02 NOTE — Patient Instructions (Addendum)
  Jill Castro , Thank you for taking time to come for your Medicare Wellness Visit. I appreciate your ongoing commitment to your health goals. Please review the following plan we discussed and let me know if I can assist you in the future.   These are the goals we discussed: Goals    . Maintain Healthy Lifestyle       This is a list of the screening recommended for you and due dates:  Health Maintenance  Topic Date Due  . Tetanus Vaccine  02/16/1948  . DEXA scan (bone density measurement)  02/15/1994  . Mammogram  07/20/2018*  . Flu Shot  06/28/2018  . Pneumonia vaccines  Completed  *Topic was postponed. The date shown is not the original due date.

## 2018-05-02 NOTE — Progress Notes (Signed)
Subjective:   Jill Castro is a 84 y.o. female who presents for Medicare Annual (Subsequent) preventive examination.  Review of Systems:  No ROS.  Medicare Wellness Visit. Additional risk factors are reflected in the social history. Cardiac Risk Factors include: advanced age (>4men, >11 women);hypertension     Objective:     Vitals: BP 138/78 (BP Location: Left Arm, Patient Position: Sitting, Cuff Size: Normal)   Pulse 74   Temp 98 F (36.7 C) (Oral)   Resp 15   Ht 5\' 4"  (1.626 m)   Wt 130 lb 12.8 oz (59.3 kg)   SpO2 96%   BMI 22.45 kg/m   Body mass index is 22.45 kg/m.  Advanced Directives 05/02/2018 08/12/2017 08/12/2017 05/01/2017 01/30/2017  Does Patient Have a Medical Advance Directive? Yes Yes Yes Yes Yes  Type of Advance Directive Living will;Healthcare Power of Attorney Living will Living will [place];Living will [place];Living will  Does patient want to make changes to medical advance directive? No - Patient declined No - Patient declined - No - Patient declined No - Patient declined  Copy of [place] in Chart? No - copy requested - - No - copy requested No - copy requested    Tobacco Social History   Tobacco Use  Smoking Status Never Smoker  Smokeless Tobacco Never Used     Counseling given: Not Answered   Clinical Intake:  Pre-visit preparation completed: Yes  Pain : No/denies pain     Nutritional Status: BMI of 19-24  Normal Diabetes: No  How often do you need to have someone help you when you read instructions, pamphlets, or other written materials from your doctor or pharmacy?: 1 - Never  Interpreter Needed?: No     Past Medical History:  Diagnosis Date  . Sherran Needs syndrome   . Diastolic CHF, acute (HCC)    Echo (8/10) with EF 60-65%, mild LVH, mild to moderate TR, PASP 40 mmHg.  . Dizziness    Thought to be side effect of Norvasc  . GERD (gastroesophageal reflux  disease)   . Glaucoma   . HTN (hypertension)   . Hypercalcemia    On HCTZ  . Hypercholesterolemia   . Hyperkalemia   . Macular degeneration   . Osteoporosis   . Renal fibromuscular dysplasia ([place])   . Renal insufficiency   . S/P cardiac catheterization 05/2002   Luminal irregularities only in the coronaries. EF 55%. There was some irregulatrity in the right renal artery suggestive of possible fibromuscular dysplasia. Myoview in 5/08 showed no ischemia or infarction. Lexiscan myoview at [facility] (10/10) showed EF 55%, normal wall motion, no evience ofor ischemia or infarction.  . Scoliosis associated with other condition    Chronic back pain  . Uterine cancer ([place])    S/P hypsterectomy in 2008  . Wears hearing aid    bilateral   Past Surgical History:  Procedure Laterality Date  . AQUEOUS SHUNT Left 01/30/2017   Procedure: AQUEOUS SHUNT ahmed tube;  Surgeon: Ronnell Freshwater, MD;  Location: [place];  Service: Ophthalmology;  Laterality: Left;  ahmed tube shunt with scleral patch graft  . CARDIAC CATHETERIZATION     Left  . CATARACT EXTRACTION Right 07/03/2013   [facility] Dr Wallace Going  . CATARACT EXTRACTION W/PHACO Left 01/30/2017   Procedure: CATARACT EXTRACTION PHACO AND INTRAOCULAR LENS PLACEMENT ([place])  left;  Surgeon: Ronnell Freshwater, MD;  Location: [place];  Service: Ophthalmology;  Laterality: Left;  . REFRACTIVE SURGERY    . TOTAL ABDOMINAL HYSTERECTOMY W/ BILATERAL SALPINGOOPHORECTOMY     stage I C well differentiated adenocarcinoma   Family History  Problem Relation Age of Onset  . Heart disease Father   . Heart disease Mother   . Parkinson's disease Mother   . Diabetes Cousin    Social History   Socioeconomic History  . Marital status: Widowed    Spouse name: Not on file  . Number of children: 2  . Years of education: Not on file  . Highest education level: Not on file  Occupational History  . Not on file  Social Needs  .  Financial resource strain: Not hard at all  . Food insecurity:    Worry: Never true    Inability: Never true  . Transportation needs:    Medical: No    Non-medical: No  Tobacco Use  . Smoking status: Never Smoker  . Smokeless tobacco: Never Used  Substance and Sexual Activity  . Alcohol use: No    Alcohol/week: 0.0 oz  . Drug use: No  . Sexual activity: Not on file  Lifestyle  . Physical activity:    Days per week: Not on file    Minutes per session: Not on file  . Stress: Not on file  Relationships  . Social connections:    Talks on phone: Not on file    Gets together: Not on file    Attends religious service: Not on file    Active member of club or organization: Not on file    Attends meetings of clubs or organizations: Not on file    Relationship status: Not on file  Other Topics Concern  . Not on file  Social History Narrative  . Not on file    Outpatient Encounter Medications as of 05/02/2018  Medication Sig  . amLODipine (NORVASC) 5 MG tablet Take 1 tablet (5 mg total) by mouth 2 (two) times daily as needed.  Marland Kitchen aspirin 81 MG EC tablet Take 81 mg by mouth daily.    . B Complex Vitamins (PA B-COMPLEX WITH B-12) TABS Take 1 tablet by mouth daily.    . Calcium Carbonate-Vitamin D (CALCIUM-VITAMIN D) 600-200 MG-UNIT CAPS Take by mouth. Takes 1/2 tablet daily.  . clotrimazole (LOTRIMIN) 1 % cream APPLY TO FOOT AND FINGER TWICE DAILY  . furosemide (LASIX) 40 MG tablet TAKE ONE TABLET EVERY DAY  . Glucosamine-Chondroitin 1500-1200 MG/30ML LIQD Take by mouth. Takes 1/2 tablet daily.  . hydrALAZINE (APRESOLINE) 100 MG tablet Take 100 mg 1/2 tablet every morning and 100 mg tablet every evening.  . latanoprost (XALATAN) 0.005 % ophthalmic solution Place 1 drop into the left eye at bedtime.   Marland Kitchen losartan (COZAAR) 100 MG tablet Take 1 tablet (100 mg total) by mouth daily.  . Magnesium 250 MG TABS Take 250 mg by mouth daily.  . Menthol, Topical Analgesic, (BIOFREEZE ROLL-ON EX)  Apply 1 application topically daily.  . multivitamin (THERAGRAN) per tablet Take 1 tablet by mouth daily.    Marland Kitchen nystatin (NYSTATIN) powder Apply topically 2 (two) times daily. Apply to affected area under breast twice a day  . nystatin cream (MYCOSTATIN) Apply 1 application topically 2 (two) times daily.  . pantoprazole (PROTONIX) 40 MG tablet TAKE ONE TABLET EVERY DAY  . Polyethylene Glycol 3350 (MIRALAX PO) Take by mouth daily.   . ranitidine (ZANTAC) 150 MG tablet TAKE ONE TABLET BY MOUTH TWICE DAILY 10 TO 15 MIN BEFORE  MEAL  . simvastatin (ZOCOR) 10 MG tablet Take 1 tablet (10 mg total) by mouth at bedtime.   No facility-administered encounter medications on file as of 05/02/2018.     Activities of Daily Living In your present state of health, do you have any difficulty performing the following activities: 05/02/2018 08/12/2017  Hearing? Y Y  Comment Hearing aids bilateral -  Vision? Y N  Difficulty concentrating or making decisions? N N  Walking or climbing stairs? Y Y  Comment unsteady gait -  Dressing or bathing? N N  Doing errands, shopping? Y N  Comment she does not drive Facilities manager and eating ? N -  Using the Toilet? N -  In the past six months, have you accidently leaked urine? N -  Do you have problems with loss of bowel control? N -  Managing your Medications? N -  Managing your Finances? Y -  Comment son assists -  Housekeeping or managing your Housekeeping? Y -  Comment staff assists -  Some recent data might be hidden    Patient Care Team: Einar Pheasant, MD as PCP - General (Unknown Physician Specialty) Minna Merritts, MD as Consulting Physician (Cardiology) Minus Breeding, MD as Consulting Physician (Cardiology) Minus Breeding, MD as Consulting Physician (Cardiology)    Assessment:   This is a routine wellness examination for [place]. The goal of the wellness visit is to assist the patient how to close the gaps in care and create a preventative care  plan for the patient.   The roster of all physicians providing medical care to patient is listed in the Snapshot section of the chart.  Taking calcium VIT D as appropriate/Osteoporosis  reviewed.    Safety issues reviewed; Smoke and carbon monoxide detectors in the home. No firearms in the home. Wears seatbelts when driving or riding with others. No violence in the home.  They do not have excessive sun exposure.  Discussed the need for sun protection: hats, long sleeves and the use of sunscreen if there is significant sun exposure.  Patient is alert, normal appearance, oriented to person/place/and time.  Correctly identified the president of the [place] and recalls of 5/5 words. Performs simple calculations and can read correct time from watch face.  Displays appropriate judgement.  No new identified risk were noted.  No failures at ADL's or IADL's.  Ambulates with a walker.   BMI- discussed the importance of a healthy diet, water intake and the benefits of aerobic exercise. Educational material provided.   24 hour diet recall: Regular diet  Dental- UTD.  Eye- Visual acuity not assessed per patient preference since they have regular follow up with the ophthalmologist.  Wears corrective lenses.  Sleep patterns- Nocturia x3.  Encouraged to stop drnking fluids 2 hours before bedtime and caffeine by 2pm.  TDAP vaccine deferred per patient preference.  Follow up with insurance.  Educational material provided.  Dexa Scan discussed.   Patient Concerns: None at this time. Follow up with PCP as needed.  Exercise Activities and Dietary recommendations Current Exercise Habits: Home exercise routine, Type of exercise: walking, Time (Minutes): 10, Frequency (Times/Week): 6, Weekly Exercise (Minutes/Week): 60, Intensity: Mild  Goals    . Maintain Healthy Lifestyle       Fall Risk Fall Risk  05/02/2018 05/01/2017 10/24/2016 05/10/2016 10/27/2014  Falls in the past year? No No No No No    Risk for fall due to : - - - - -   Depression Screen  PHQ 2/9 Scores 05/02/2018 05/01/2017 10/24/2016 05/10/2016  PHQ - 2 Score 0 1 0 0     Cognitive Function     6CIT Screen 05/02/2018 05/01/2017  What Year? 0 points 0 points  What month? 0 points 0 points  What time? 0 points 0 points  Count back from 20 0 points 2 points  Months in reverse 0 points 0 points  Repeat phrase 0 points 2 points  Total Score 0 4    Immunization History  Administered Date(s) Administered  . Influenza Split 09/18/2012, 08/19/2013, 08/27/2014  . Influenza, High Dose Seasonal PF 08/18/2017  . Influenza-Unspecified 09/23/2015, 08/29/2016  . Pneumococcal Conjugate-13 04/24/2014  . Pneumococcal Polysaccharide-23 06/06/2017   Screening Tests Health Maintenance  Topic Date Due  . TETANUS/TDAP  02/16/1948  . DEXA SCAN  02/15/1994  . MAMMOGRAM  07/20/2018 (Originally 10/30/2014)  . INFLUENZA VACCINE  06/28/2018  . PNA vac Low Risk Adult  Completed      Plan:    End of life planning; Advance aging; Advanced directives discussed. Copy of current HCPOA/Living Will requested.    I have personally reviewed and noted the following in the patient's chart:   . Medical and social history . Use of alcohol, tobacco or illicit drugs  . Current medications and supplements . Functional ability and status . Nutritional status . Physical activity . Advanced directives . List of other physicians . Hospitalizations, surgeries, and ER visits in previous 12 months . Vitals . Screenings to include cognitive, depression, and falls . Referrals and appointments  In addition, I have reviewed and discussed with patient certain preventive protocols, quality metrics, and best practice recommendations. A written personalized care plan for preventive services as well as general preventive health recommendations were provided to patient.     Varney Biles, LPN  08/07/3715

## 2018-05-17 LAB — CBC AND DIFFERENTIAL
HCT: 36 (ref 36–46)
Hemoglobin: 12.1 (ref 12.0–16.0)
Neutrophils Absolute: 4643
Platelets: 206 (ref 150–399)
WBC: 7.7

## 2018-05-17 LAB — BASIC METABOLIC PANEL
BUN: 43 — AB (ref 4–21)
Creatinine: 1.6 — AB (ref 0.5–1.1)
GLUCOSE: 105
Potassium: 4.9 (ref 3.4–5.3)
Sodium: 137 (ref 137–147)

## 2018-06-13 ENCOUNTER — Encounter: Payer: Self-pay | Admitting: Internal Medicine

## 2018-06-19 ENCOUNTER — Other Ambulatory Visit: Payer: Self-pay | Admitting: Cardiovascular Disease

## 2018-06-20 ENCOUNTER — Other Ambulatory Visit: Payer: Self-pay | Admitting: Cardiovascular Disease

## 2018-06-22 ENCOUNTER — Ambulatory Visit: Payer: Self-pay

## 2018-06-22 NOTE — Telephone Encounter (Signed)
Ret'd call to pt.  Stated she has had constipation for years.  Reported she has been having difficulty emptying her rectum. Stated "it feels like there is a blockage."  Stated her bowels move in very small amts. 2-3 times/ day.  Stool is "soft."  Denied abd. distension, nausea, or vomiting.  Stated she has occas. abdominal pain. Reported she uses Miralax daily, in AM, a stool softener daily, in PM, and Magnesium daily.  Also, uses Preparation H suppositories to assist with BM.  Denied pain at rectum.  Stated the "anus feels very tight."  Appt. Given for 8/1 with PCP office.  Care advice given per protocol; recommended a fiber supplement daily to help to bulk up the very soft stool, and possibly make it easier to evacuate.  Encouraged to continue to drink water for adequate hydration; advised water is needed if she increases her fiber.  Encour. to call back if symptoms worsen.  Pt. verb. understanding.           Reason for Disposition . [1] Uses laxative (e.g., PEG / Miralax. Milk of magnesia) or enema AND [2] > once a month  Answer Assessment - Initial Assessment Questions 1. STOOL PATTERN OR FREQUENCY: "How often do you pass bowel movements (BMs)?"  (Normal range: tid to q 3 days)  "When was the last BM passed?"       Usually daily, only goes in small amts 2-3 times / day; doesn't feel like she empties out  2. STRAINING: "Do you have to strain to have a BM?"      Not really; a little bit 3. RECTAL PAIN: "Does your rectum hurt when the stool comes out?" If so, ask: "Do you have hemorrhoids? How bad is the pain?"  (Scale 1-10; or mild, moderate, severe)     Denied pain in rectum 4. STOOL COMPOSITION: "Are the stools hard?"      Formed and soft 5. BLOOD ON STOOLS: "Has there been any blood on the toilet tissue or on the surface of the BM?" If so, ask: "When was the last time?"      Denied any blood in stool  6. CHRONIC CONSTIPATION: "Is this a new problem for you?"  If no, ask: "How long have you had  this problem?" (days, weeks, months)     Chronic constipation for years 7. CHANGES IN DIET: "Have there been any recent changes in your diet?"      Lives at an independent living facility  8. MEDICATIONS: "Have you been taking any new medications?"     no 9. LAXATIVES: "Have you been using any laxatives or enemas?"  If yes, ask "What, how often, and when was the last time?"    Miralax, Magnesium, and stool softener 10. CAUSE: "What do you think is causing the constipation?"       Stated she feels like there is a blockage 11. OTHER SYMPTOMS: "Do you have any other symptoms?" (e.g., abdominal pain, fever, vomiting)      Occas. abd. Pain; denied abd. Bloating  nausea / vomiting; denied fever/ chills  Protocols used: CONSTIPATION-A-AH   

## 2018-06-24 NOTE — Progress Notes (Signed)
Cardiology Office Note  Date:  06/26/2018   ID:  Jill Castro, DOB December 23, 1926, MRN [id number]  PCP:  Jill Pheasant, MD   Chief Complaint  Patient presents with  . other    6 month f/u c/o sob. Meds reviewed verbally with pt.    HPI:  Jill Castro is an 84 yo pleasant woman with history of  HTN  diastolic CHF,  chronic lower extremity edema (left leg in particular), severe scoliosis and chronic back pain and requires pain medication.  Husband passed Recently from pancreatic cancer presents for followup of her hypertension and diastolic CHF   In follow-up today she reports that she has been relatively stable Feet swelling comes and goes depending on if she uses her compression hose No falls, using her walker Drinking more water for constipation Rare episodes of shortness of breath walking with her walker  medications Lasix PRN Hydralazine 50 TID Losartan daily Sometimes uses amlodipine for elevated blood pressure Reports that blood pressure has been elevated on her checks typicall BP 130 to 140 in the office on previous visits At home as been higher  Periodic Constipation hallucinations still an issue, at night when she wakes up Macular degeneration in the right eye, occlusion on the left Has had injections Depressed,  [place] Food is poor  [employer] Son lives in [place], daughter in [place] to have chronic back pain "worse"  EKG personally reviewed by myself on todays visit Shows normal sinus rhythm rate 72 bpm APCs no significant ST-T wave changes  Other past medical history Chronic back pain, not a candidate for back surgery Continued eye problem, macular degeneration. Baseline Creatinine 1.4 Tries to wear compression hose when she can.  catheterization in 2003 showing luminal irregularities, negative stress test in October 2010 with no ischemia, echocardiogram August 2010 showing normal systolic function, mild to  moderate tricuspid regurgitation, mildly elevated right ventricular systolic pressures consistent with mild pulmonary hypertension, ejection fraction 60%.  PMH:   has a past medical history of Jill Castro, Diastolic CHF, acute ([place]), Dizziness, GERD (gastroesophageal reflux disease), Glaucoma, HTN (hypertension), Hypercalcemia, Hypercholesterolemia, Hyperkalemia, Macular degeneration, Osteoporosis, Renal fibromuscular dysplasia ([place]), Renal insufficiency, S/P cardiac catheterization (05/2002), Scoliosis associated with other condition, Uterine cancer ([place]), and Wears hearing aid.  PSH:    Past Surgical History:  Procedure Laterality Date  . AQUEOUS SHUNT Left 01/30/2017   Procedure: AQUEOUS SHUNT ahmed tube;  Surgeon: Ronnell Freshwater, MD;  Location: [facility];  Service: Ophthalmology;  Laterality: Left;  ahmed tube shunt with scleral patch graft  . CARDIAC CATHETERIZATION     Left  . CATARACT EXTRACTION Right 07/03/2013   [facility] Dr Wallace Going  . CATARACT EXTRACTION W/PHACO Left 01/30/2017   Procedure: CATARACT EXTRACTION PHACO AND INTRAOCULAR LENS PLACEMENT ([place])  left;  Surgeon: Ronnell Freshwater, MD;  Location: [facility];  Service: Ophthalmology;  Laterality: Left;  . REFRACTIVE SURGERY    . TOTAL ABDOMINAL HYSTERECTOMY W/ BILATERAL SALPINGOOPHORECTOMY     stage I C well differentiated adenocarcinoma    Current Outpatient Medications  Medication Sig Dispense Refill  . Acetaminophen (TYLENOL PO) Take by mouth as needed.    Marland Kitchen aspirin 81 MG EC tablet Take 81 mg by mouth daily.      . B Complex Vitamins (PA B-COMPLEX WITH B-12) TABS Take 1 tablet by mouth daily.      . Calcium Carbonate-Vitamin D (CALCIUM-VITAMIN D) 600-200 MG-UNIT CAPS Take by mouth. Takes 1/2 tablet  daily.    . clotrimazole (LOTRIMIN) 1 % cream APPLY TO FOOT AND FINGER TWICE DAILY 30 g 0  . docusate sodium (STOOL SOFTENER) 100 MG capsule Take 100 mg by mouth daily as needed for  mild constipation.    . furosemide (LASIX) 40 MG tablet TAKE ONE TABLET EVERY DAY 30 tablet 3  . Glucosamine-Chondroitin 1500-1200 MG/30ML LIQD Take by mouth. Takes 1/2 tablet daily.    . hydrALAZINE (APRESOLINE) 100 MG tablet Take 100 mg 1/2 tablet every morning and 100 mg tablet every evening. (Patient taking differently: Take 1/2 tablet TID.) 135 tablet 3  . latanoprost (XALATAN) 0.005 % ophthalmic solution Place 1 drop into the left eye at bedtime.     Marland Kitchen losartan (COZAAR) 100 MG tablet Take 1 tablet (100 mg total) by mouth daily. 90 tablet 3  . Magnesium 250 MG TABS Take 250 mg by mouth daily.    . Menthol, Topical Analgesic, (BIOFREEZE ROLL-ON EX) Apply 1 application topically daily.    . multivitamin (THERAGRAN) per tablet Take 1 tablet by mouth daily.      Marland Kitchen nystatin (NYSTATIN) powder Apply topically 2 (two) times daily. Apply to affected area under breast twice a day 15 g 0  . nystatin cream (MYCOSTATIN) Apply 1 application topically 2 (two) times daily. 30 g 0  . pantoprazole (PROTONIX) 40 MG tablet TAKE ONE TABLET EVERY DAY 30 tablet 3  . Polyethylene Glycol 3350 (MIRALAX PO) Take by mouth daily.     . ranitidine (ZANTAC) 150 MG tablet TAKE ONE TABLET BY MOUTH TWICE DAILY 10 TO 15 MIN BEFORE MEAL    . simvastatin (ZOCOR) 10 MG tablet Take 1 tablet (10 mg total) by mouth at bedtime. 90 tablet 3   No current facility-administered medications for this visit.      Allergies:   Clonidine; Benicar [olmesartan]; Celebrex [celecoxib]; Iodine; and Penicillins   Social History:  The patient  reports that she has never smoked. She has never used smokeless tobacco. She reports that she does not drink alcohol or use drugs.   Family History:   family history includes Diabetes in her cousin; Heart disease in her father and mother; Parkinson's disease in her mother.    Review of Systems: Review of Systems  Constitutional: Negative.   Respiratory: Negative.   Cardiovascular: Negative.    Gastrointestinal: Negative.   Musculoskeletal: Negative.   Neurological: Negative.   Psychiatric/Behavioral: Negative.   All other systems reviewed and are negative.    PHYSICAL EXAM: VS:  BP 140/78 (BP Location: Left Arm, Patient Position: Sitting, Cuff Size: Normal)   Pulse 69   Ht 5\' 4"  (1.626 m)   Wt 129 lb 4 oz (58.6 kg)   BMI 22.19 kg/m  , BMI Body mass index is 22.19 kg/m. Constitutional: frail,  kyphosis, unsteady gait HENT:  Head: Normocephalic and atraumatic.  Eyes:  no discharge. No scleral icterus.  Neck: Normal range of motion. Neck supple. No JVD present.  Cardiovascular: Normal rate, regular rhythm, normal heart sounds and intact distal pulses. Exam reveals no gallop and no friction rub. No edema No murmur heard. Pulmonary/Chest: decreased inspiratory effort,  breath sounds normal. No stridor. No respiratory distress.  no wheezes.  no rales.  no tenderness.  Abdominal: Soft.  no distension.  no tenderness.  Musculoskeletal: Normal range of motion.  no  tenderness or deformity.  Neurological:  normal muscle tone. Coordination normal. No atrophy Skin: Skin is warm and dry. No rash noted. not diaphoretic.  Psychiatric:  normal mood and affect. behavior is normal. Thought content normal.    Recent Labs: 05/17/2018: BUN 43; Creatinine 1.6; Hemoglobin 12.1; Platelets 206; Potassium 4.9; Sodium 137    Lipid Panel Lab Results  Component Value Date   CHOL 169 08/13/2017   HDL 81 08/13/2017   LDLCALC 72 08/13/2017   TRIG 81 08/13/2017      Wt Readings from Last 3 Encounters:  06/26/18 129 lb 4 oz (58.6 kg)  06/26/18 128 lb 8 oz (58.3 kg)  05/02/18 130 lb 12.8 oz (59.3 kg)       ASSESSMENT AND PLAN:  Hypercholesterolemia No new recent labs available We'll continue low-dose simvastatin  DIASTOLIC HEART FAILURE, CHRONIC She takes Lasix periodically for shortness of breath or worsening ankle swelling Lungs are clear, no shortness of breath on  exertion. Weight relatively stable Stressed caution as she is drinking more fluids  Essential hypertension Blood pressure reasonable on today's visit as on other clinic visits with our office and other providers. Her numbers at home tend to run higher Suggested she take extra hydralazine or amlodipine as needed  Edema, unspecified type Consistent with venous insufficiency,  continue compression hose and leg elevation  Chronic kidney disease (CKD), stage III (moderate) Lasix  3-4 times per week  Visual hallucinations Etiology of her visual hallucinations is not clear Seem to happen after loss of her husband Suspect neurologic issue  Disposition:   F/U  12 months   Total encounter time more than 25 minutes  Greater than 50% was spent in counseling and coordination of care with the patient    Orders Placed This Encounter  Procedures  . EKG 12-Lead     Signed, Esmond Plants, M.D., Ph.D. 06/26/2018  [place], [place]

## 2018-06-25 NOTE — Progress Notes (Signed)
Subjective:    [name], female    DOB: 04-Apr-1927, 84 y.o.   MRN: [id number]  HPI  Jill Castro is an 84 year old female who presents today with difficulty having a BM stating she feels tightness around her anus. She reports that this started approximately 2 weeks ago. History of constipation "for years." She states that she "feels like there is a blockage" that results in small BMs. She has BMs which are noted as small and occurring 2-3 times/day. Stool is noted to be soft. She is currently taking Miralax in the morning intermittently , stool softener in the evening, and magnesium daily. She will also use preparation H suppositories to assist with BM.   Abdominal Pain: No Loss of appetite: No, she reports that food is not good where she lives so she has eaten less but will eat. Rectal bleeding: No Fever: No Weight loss: No  Hematochezia: No Melena: No Thin caliber stools: No, but stools are soft with medications listed above Recent change in stool caliber: No Family History of colon cancer or IBS: No, per patient Stress/Depression: No   Review of colonoscopy findings from 06/04/10 noted tortuosity of colon and diverticulosis. Also noted was flaccid abdominal wall.  She is followed by GI and last visit on 02/23/17 noted hemoccult cards x 2 were negative. She also noted that she was having daily BMs with Miralax and once daily stool softener. She was advised to follow up if symptoms worsen or failing treatment for constipation.   Review of Systems  Constitutional: Negative for chills, fatigue and fever.  Respiratory: Negative for cough, shortness of breath and wheezing.   Cardiovascular: Negative for chest pain and palpitations.  Gastrointestinal: Positive for constipation. Negative for abdominal pain, diarrhea, nausea and vomiting.  Genitourinary: Negative for dysuria.  Skin: Negative for rash.  Neurological: Negative for weakness.   Past Medical History:    Diagnosis Date  . Jill Castro syndrome   . Diastolic CHF, acute ([facility])    Echo (8/10) with EF 60-65%, mild LVH, mild to moderate TR, PASP 40 mmHg.  . Dizziness    Thought to be side effect of Norvasc  . GERD (gastroesophageal reflux disease)   . Glaucoma   . HTN (hypertension)   . Hypercalcemia    On HCTZ  . Hypercholesterolemia   . Hyperkalemia   . Macular degeneration   . Osteoporosis   . Renal fibromuscular dysplasia ([place])   . Renal insufficiency   . S/P cardiac catheterization 05/2002   Luminal irregularities only in the coronaries. EF 55%. There was some irregulatrity in the right renal artery suggestive of possible fibromuscular dysplasia. Myoview in 5/08 showed no ischemia or infarction. Lexiscan [facility]iew [facility] (10/10) showed EF 55%, normal wall motion, no evience ofor ischemia or infarction.  . Scoliosis associated with other condition    Chronic back pain  . Uterine cancer ([place])    S/P hypsterectomy in 2008  . Wears hearing aid    bilateral     Social History   Socioeconomic History  . Marital status: Widowed    Spouse name: Not on file  . Number of children: 2  . Years of education: Not on file  . Highest education level: Not on file  Occupational History  . Not on file  Social Castro  . Financial resource strain: Not hard at all  . Food insecurity:    Worry: Never true    Inability: Never true  . Transportation  Castro:    Medical: No    Non-medical: No  Tobacco Use  . Smoking status: Never Smoker  . Smokeless tobacco: Never Used  Substance and Sexual Activity  . Alcohol use: No    Alcohol/week: 0.0 oz  . Drug use: No  . Sexual activity: Not on file  Lifestyle  . Physical activity:    Days per week: Not on file    Minutes per session: Not on file  . Stress: Not on file  Relationships  . Social connections:    Talks on phone: Not on file    Gets together: Not on file    Attends religious service: Not on file    Active member of club or  organization: Not on file    Attends meetings of clubs or organizations: Not on file    Relationship status: Not on file  . Intimate partner violence:    Fear of current or ex partner: Not on file    Emotionally abused: Not on file    Physically abused: Not on file    Forced sexual activity: Not on file  Other Topics Concern  . Not on file  Social History Narrative  . Not on file    Past Surgical History:  Procedure Laterality Date  . AQUEOUS SHUNT Left 01/30/2017   Procedure: AQUEOUS SHUNT ahmed tube;  Surgeon: Ronnell Freshwater, MD;  Location: [facility];  Service: Ophthalmology;  Laterality: Left;  ahmed tube shunt with scleral patch graft  . CARDIAC CATHETERIZATION     Left  . CATARACT EXTRACTION Right 07/03/2013   [facility] Dr Wallace Going  . CATARACT EXTRACTION W/PHACO Left 01/30/2017   Procedure: CATARACT EXTRACTION PHACO AND INTRAOCULAR LENS PLACEMENT ([place])  left;  Surgeon: Ronnell Freshwater, MD;  Location: [facility];  Service: Ophthalmology;  Laterality: Left;  . REFRACTIVE SURGERY    . TOTAL ABDOMINAL HYSTERECTOMY W/ BILATERAL SALPINGOOPHORECTOMY     stage I C well differentiated adenocarcinoma    Family History  Problem Relation Age of Onset  . Heart disease Father   . Heart disease Mother   . Parkinson's disease Mother   . Diabetes Cousin     Allergies  Allergen Reactions  . Clonidine   . Benicar [Olmesartan] Other (See Comments)    Lip swelling  . Celebrex [Celecoxib]   . Iodine     Avoids due to decreased kidney function  . Penicillins Rash    Current Outpatient Medications on File Prior to Visit  Medication Sig Dispense Refill  . aspirin 81 MG EC tablet Take 81 mg by mouth daily.      . B Complex Vitamins (PA B-COMPLEX WITH B-12) TABS Take 1 tablet by mouth daily.      . Calcium Carbonate-Vitamin D (CALCIUM-VITAMIN D) 600-200 MG-UNIT CAPS Take by mouth. Takes 1/2 tablet daily.    . clotrimazole (LOTRIMIN) 1 % cream APPLY TO  FOOT AND FINGER TWICE DAILY 30 g 0  . docusate sodium (STOOL SOFTENER) 100 MG capsule Take 100 mg by mouth daily as needed for mild constipation.    . furosemide (LASIX) 40 MG tablet TAKE ONE TABLET EVERY DAY 30 tablet 3  . Glucosamine-Chondroitin 1500-1200 MG/30ML LIQD Take by mouth. Takes 1/2 tablet daily.    . hydrALAZINE (APRESOLINE) 100 MG tablet Take 100 mg 1/2 tablet every morning and 100 mg tablet every evening. (Patient taking differently: Take 1/2 tablet TID.) 135 tablet 3  . latanoprost (XALATAN) 0.005 % ophthalmic solution Place 1 drop  into the left eye at bedtime.     Marland Kitchen losartan (COZAAR) 100 MG tablet Take 1 tablet (100 mg total) by mouth daily. 90 tablet 3  . Magnesium 250 MG TABS Take 250 mg by mouth daily.    . Menthol, Topical Analgesic, (BIOFREEZE ROLL-ON EX) Apply 1 application topically daily.    . multivitamin (THERAGRAN) per tablet Take 1 tablet by mouth daily.      Marland Kitchen nystatin (NYSTATIN) powder Apply topically 2 (two) times daily. Apply to affected area under breast twice a day 15 g 0  . nystatin cream (MYCOSTATIN) Apply 1 application topically 2 (two) times daily. 30 g 0  . pantoprazole (PROTONIX) 40 MG tablet TAKE ONE TABLET EVERY DAY 30 tablet 3  . Polyethylene Glycol 3350 (MIRALAX PO) Take by mouth daily.     . ranitidine (ZANTAC) 150 MG tablet TAKE ONE TABLET BY MOUTH TWICE DAILY 10 TO 15 MIN BEFORE MEAL    . simvastatin (ZOCOR) 10 MG tablet Take 1 tablet (10 mg total) by mouth at bedtime. 90 tablet 3  . Acetaminophen (TYLENOL PO) Take by mouth as needed.     No current facility-administered medications on file prior to visit.     BP 132/90 (BP Location: Left Arm, Patient Position: Sitting, Cuff Size: Normal)   Pulse 63   Temp 98.1 F (36.7 C) (Oral)   Resp 15   Wt 128 lb 8 oz (58.3 kg)   SpO2 97%   BMI 22.06 kg/m        Objective:   Physical Exam  Constitutional: She is oriented to person, place, and time.  Frail, thin elderly female with kyphosis    Eyes: Pupils are equal, round, and reactive to light. No scleral icterus.  Neck: Neck supple.  Cardiovascular: Normal rate, regular rhythm and intact distal pulses.  Pulmonary/Chest: Effort normal and breath sounds normal. She has no wheezes. She has no rales.  Abdominal: Soft. Bowel sounds are normal. She exhibits no distension and no mass. There is no tenderness. There is no guarding.  Genitourinary: Rectal exam shows external hemorrhoid and anal tone abnormal. Rectal exam shows no mass, no tenderness and guaiac negative stool.  Genitourinary Comments: Digital rectal exam demonstrated no masses.  Lymphadenopathy:    She has no cervical adenopathy.  Neurological: She is alert and oriented to person, place, and time.  Skin: Skin is warm and dry.  Psychiatric: She has a normal mood and affect. Her behavior is normal. Judgment and thought content normal.        Assessment & Plan:  1. Constipation, unspecified constipation type Exam (conducted with a chaperone) and history are most consistent with constipation that has been chronic and increasing use of osmotic laxatives. Hemoccult check in office is negative today. She is currently well managed on Miralax intermittently and OTC stool softener which has resulted in 2 to 3 soft BMs daily. She states that she is concerned that medication "may stop working." We discussed that decrease in volume of stool is likely due to decrease in intake however she states that her appetite is "okay". No obvious reason for her description of tightness around her anus.  Advised increasing dietary fiber and drink water however we discussed proceeding carefully with water due to history of chronic diastolic CHF . She has an appointment with cardiology today and she also stated that she will mention water intake to them today. Continue Miralax as needed which can be titrated so stools will not become too loose.  She will continue OTC stool softener daily.  Follow up with  GI provider as recommended as she is concerned that she is needing to increase her OTC medications to have a BM; she plans to make an appointment with GI provider soon; reviewed constipation, food choices to improve constipation, and bowel obstruction. She is aware of warning signs that require immediate medical evaluation.   Delano Metz, FNP-C

## 2018-06-26 ENCOUNTER — Encounter: Payer: Self-pay | Admitting: Cardiovascular Disease

## 2018-06-26 ENCOUNTER — Encounter: Payer: Self-pay | Admitting: Family Medicine

## 2018-06-26 ENCOUNTER — Ambulatory Visit (INDEPENDENT_AMBULATORY_CARE_PROVIDER_SITE_OTHER): Payer: Medicare Other | Admitting: Cardiovascular Disease

## 2018-06-26 ENCOUNTER — Ambulatory Visit (INDEPENDENT_AMBULATORY_CARE_PROVIDER_SITE_OTHER): Payer: Medicare Other | Admitting: Family Medicine

## 2018-06-26 VITALS — BP 140/78 | HR 69 | Ht 64.0 in | Wt 129.2 lb

## 2018-06-26 VITALS — BP 132/90 | HR 63 | Temp 98.1°F | Resp 15 | Wt 128.5 lb

## 2018-06-26 DIAGNOSIS — K5900 Constipation, unspecified: Secondary | ICD-10-CM

## 2018-06-26 DIAGNOSIS — I10 Essential (primary) hypertension: Secondary | ICD-10-CM

## 2018-06-26 DIAGNOSIS — R600 Localized edema: Secondary | ICD-10-CM

## 2018-06-26 DIAGNOSIS — I5032 Chronic diastolic (congestive) heart failure: Secondary | ICD-10-CM

## 2018-06-26 DIAGNOSIS — N183 Chronic kidney disease, stage 3 unspecified: Secondary | ICD-10-CM

## 2018-06-26 DIAGNOSIS — R0602 Shortness of breath: Secondary | ICD-10-CM

## 2018-06-26 DIAGNOSIS — R0609 Other forms of dyspnea: Secondary | ICD-10-CM

## 2018-06-26 NOTE — Patient Instructions (Signed)
Please continue use of Miralax as needed with your current regimen. You are having bowel movements daily and the decrease in amount may be due to your decrease of food intake.  Focus on increasing vegetables in your diet with water intake.   Monitor for any changes in symptoms and follow up with your GI provider as recommended.   Constipation, Adult Constipation is when a person:  Poops (has a bowel movement) fewer times in a week than normal.  Has a hard time pooping.  Has poop that is dry, hard, or bigger than normal.  Follow these instructions at home: Eating and drinking   Eat foods that have a lot of fiber, such as: ? Fresh fruits and vegetables. ? Whole grains. ? Beans.  Eat less of foods that are high in fat, low in fiber, or overly processed, such as: ? [place] fries. ? Hamburgers. ? Cookies. ? Candy. ? Soda.  Drink enough fluid to keep your pee (urine) clear or pale yellow. General instructions  Exercise regularly or as told by your doctor.  Go to the restroom when you feel like you need to poop. Do not hold it in.  Take over-the-counter and prescription medicines only as told by your doctor. These include any fiber supplements.  Do pelvic floor retraining exercises, such as: ? Doing deep breathing while relaxing your lower belly (abdomen). ? Relaxing your pelvic floor while pooping.  Watch your condition for any changes.  Keep all follow-up visits as told by your doctor. This is important. Contact a doctor if:  You have pain that gets worse.  You have a fever.  You have not pooped for 4 days.  You throw up (vomit).  You are not hungry.  You lose weight.  You are bleeding from the anus.  You have thin, pencil-like poop (stool). Get help right away if:  You have a fever, and your symptoms suddenly get worse.  You leak poop or have blood in your poop.  Your belly feels hard or bigger than normal (is bloated).  You have very bad belly  pain.  You feel dizzy or you faint. This information is not intended to replace advice given to you by your health care provider. Make sure you discuss any questions you have with your health care provider. Document Released: 05/02/2008 Document Revised: 06/03/2016 Document Reviewed: 05/04/2016 Elsevier Interactive Patient Education  2018 [employer].

## 2018-06-26 NOTE — Patient Instructions (Addendum)
Medication Instructions:   No medication changes made  For blood pressure >160, Take extra 1/2 hydralazine (6 hour pill) or amloidpine (24 hour pill)   Labwork:  No new labs needed  Testing/Procedures:  No further testing at this time   Follow-Up: It was a pleasure seeing you in the office today. Please call us if you have new issues that need to be addressed before your next appt.  [phone]  Your physician wants you to follow-up in: 12 months.  You will receive a reminder letter in the mail two months in advance. If you don't receive a letter, please call our office to schedule the follow-up appointment.  If you need a refill on your cardiac medications before your next appointment, please call your pharmacy.  For educational health videos Log in to : [email or web address] Or : [email or web address], password : [email or web address]

## 2018-06-28 ENCOUNTER — Ambulatory Visit: Payer: Medicare Other | Admitting: Family Medicine

## 2018-06-29 ENCOUNTER — Other Ambulatory Visit: Payer: Self-pay | Admitting: Internal Medicine

## 2018-07-16 ENCOUNTER — Other Ambulatory Visit: Payer: Self-pay | Admitting: Internal Medicine

## 2018-07-16 ENCOUNTER — Telehealth: Payer: Self-pay

## 2018-07-16 DIAGNOSIS — M810 Age-related osteoporosis without current pathological fracture: Secondary | ICD-10-CM

## 2018-07-16 DIAGNOSIS — M419 Scoliosis, unspecified: Secondary | ICD-10-CM

## 2018-07-16 DIAGNOSIS — G8929 Other chronic pain: Secondary | ICD-10-CM

## 2018-07-16 DIAGNOSIS — M545 Low back pain: Principal | ICD-10-CM

## 2018-07-16 NOTE — Telephone Encounter (Signed)
I have placed order for home health physical therapy.  Someone should be contacting her with appt date and time.

## 2018-07-16 NOTE — Telephone Encounter (Signed)
OK to place referral for therapy?

## 2018-07-16 NOTE — Progress Notes (Signed)
Order placed for home health for physical therapy.

## 2018-07-16 NOTE — Telephone Encounter (Signed)
Copied from [place] [id number]. Topic: Quick Communication - See Telephone Encounter >> Jul 16, 2018  9:28 AM Antonieta Iba C wrote: CRM for notification. See Telephone encounter for: 07/16/18.  Pt would like to have therapy to come to her [facility] (nursing facility) to help with her back pain (scoliosis)    CB: [phone]

## 2018-07-17 NOTE — Telephone Encounter (Signed)
Patient is aware 

## 2018-07-20 ENCOUNTER — Telehealth: Payer: Self-pay | Admitting: Internal Medicine

## 2018-07-20 NOTE — Telephone Encounter (Signed)
Verbal orders given to [facility] with [facility] for physical therapy

## 2018-07-20 NOTE — Telephone Encounter (Unsigned)
Copied from [place]. Topic: General - Other >> Jul 20, 2018 11:41 AM Carolyn Stare wrote:  Skeet Simmer with [facility] call to req verbal physical therapy orders    1 x 1   2 x 2  1 x 1   [phone]

## 2018-07-23 NOTE — Telephone Encounter (Signed)
Patient is refusing PT. She did not have an allergic reaction, their systems flagged an interaction. Jill Castro is wondering if patient should have a nurse come out and monitor weight and check on her?

## 2018-07-23 NOTE — Telephone Encounter (Signed)
Skeet Simmer with [facility] called back today and said that after he left from seeing pt the next day she called him and told him that she wanted to stop home health therapy. He said that also at his initial visit pt had a level 1 severe medication reaction between her medication simvastatin (ZOCOR) 10 MG tablet and amlodipine. He wants to know if pt needs a skill nurse and if she should be checking her weight frequently? He would like a call back at [phone].

## 2018-07-23 NOTE — Telephone Encounter (Signed)
If pt agreeable to nursing evaluation and further care, then ok to give order for nurse.  Also, need to clarify if on amlodipine.  We do not have amlodipine on our list currently.

## 2018-07-24 NOTE — Telephone Encounter (Signed)
Left message for patient to call back regarding nurse. Per Dr. Donivan Scull last note, he advised patient take hydralazine or amlodipine PRN for high blood pressure.

## 2018-07-24 NOTE — Telephone Encounter (Signed)
Noted.  I just need clarification for our medication list.

## 2018-07-26 ENCOUNTER — Telehealth: Payer: Self-pay

## 2018-07-26 NOTE — Telephone Encounter (Signed)
Ok to use the tylenol as directed.  Agree with therapy recommendation for walker, etc.

## 2018-07-26 NOTE — Telephone Encounter (Signed)
Copied from [place]. Topic: General - Other >> Jul 26, 2018  2:52 PM Judyann Munson wrote: Reason for CRM:  Patient son Carlis Abbott is calling to speak with Larena Glassman in regards to the patient. Please advise >> Jul 26, 2018  2:57 PM Yvette Rack wrote:   Pt son Carlis Abbott calling to speak with Larena Glassman about his mother please give him a call at [phone] try this one first or [phone]

## 2018-07-26 NOTE — Telephone Encounter (Signed)
Patient son called back

## 2018-07-26 NOTE — Telephone Encounter (Signed)
Patient is wanting to know if she can take 2 tylenol 650 mg extended release at a time. She does not think that therapy is going to help her, therapist says patient needs assistance with walker and bathing, etc and patient says she does not.

## 2018-07-26 NOTE — Telephone Encounter (Signed)
Pt son Carlis Abbott calling to speak with Larena Glassman about his mother please give him a call at [phone] try this one first or [phone]

## 2018-07-27 NOTE — Telephone Encounter (Signed)
Patient and son are aware. He is going to talk to her this weekend and see if she will agree to atleast to have a nurse come in and check on things. They will call us back

## 2018-07-31 ENCOUNTER — Telehealth: Payer: Self-pay

## 2018-07-31 NOTE — Telephone Encounter (Signed)
Copied from [place]. Topic: General - Other >> Jul 31, 2018  8:50 AM Judyann Munson wrote: Reason for CRM:   [place]. Is calling to advise  Patient is refusing physical therapy.  They will be terminating  care   Best contact number: [phone].

## 2018-07-31 NOTE — Telephone Encounter (Signed)
FYI

## 2018-08-02 ENCOUNTER — Telehealth: Payer: Self-pay | Admitting: Cardiovascular Disease

## 2018-08-02 NOTE — Telephone Encounter (Signed)
I left a message for the patient to call. 

## 2018-08-02 NOTE — Telephone Encounter (Signed)
Spoke with patient and she states that when she got up this morning her heart rate was 107. She reports that she is now taking hydralazine 50 mg three times daily. She reports that BP was 160/100 HR 126 she then took additional hydralazine and it is now 152/90 HR 126. Instructed her to take whole pill of hydralazine with her next dose and continue monitoring her blood pressures with instructions to please call back tomorrow if they continue to run high on the 50 mg dosage. She verbalized understanding with no further questions at this time.

## 2018-08-02 NOTE — Telephone Encounter (Signed)
Pt c/o BP issue: STAT if pt c/o blurred vision, one-sided weakness or slurred speech  1. What are your last 5 BP readings?  08/02/18  4:25 am 141/85 HR 107  4:50 am 147/88 HR 92 Eaten and took medication  6:30 am 134/78 HR 114 9:40 am 139/92 HR 125   2. Are you having any other symptoms (ex. Dizziness, headache, blurred vision, passed out)?  She states she is not having any symptoms   3. What is your BP issue?   She states her HR is higher than normal. She states it has been like this since yesterday but today she is more worried.   Please call back

## 2018-08-02 NOTE — Telephone Encounter (Signed)
Patient returning call.

## 2018-08-03 ENCOUNTER — Inpatient Hospital Stay
Admission: AD | Admit: 2018-08-03 | Discharge: 2018-08-07 | DRG: 308 | Disposition: A | Discharge status: Home / Self Care | Payer: Medicare Other | Source: Ambulatory Visit | Attending: Internal Medicine | Admitting: Internal Medicine

## 2018-08-03 ENCOUNTER — Encounter: Payer: Self-pay | Admitting: Cardiovascular Disease

## 2018-08-03 ENCOUNTER — Ambulatory Visit (INDEPENDENT_AMBULATORY_CARE_PROVIDER_SITE_OTHER): Payer: Medicare Other | Admitting: Cardiovascular Disease

## 2018-08-03 ENCOUNTER — Other Ambulatory Visit: Payer: Self-pay

## 2018-08-03 VITALS — BP 118/90 | HR 129 | Ht 64.0 in | Wt 127.8 lb

## 2018-08-03 DIAGNOSIS — Z888 Allergy status to other drugs, medicaments and biological substances status: Secondary | ICD-10-CM | POA: Diagnosis not present

## 2018-08-03 DIAGNOSIS — E785 Hyperlipidemia, unspecified: Secondary | ICD-10-CM | POA: Diagnosis present

## 2018-08-03 DIAGNOSIS — M419 Scoliosis, unspecified: Secondary | ICD-10-CM | POA: Diagnosis present

## 2018-08-03 DIAGNOSIS — H3530 Unspecified macular degeneration: Secondary | ICD-10-CM | POA: Diagnosis present

## 2018-08-03 DIAGNOSIS — I130 Hypertensive heart and chronic kidney disease with heart failure and stage 1 through stage 4 chronic kidney disease, or unspecified chronic kidney disease: Secondary | ICD-10-CM | POA: Diagnosis present

## 2018-08-03 DIAGNOSIS — N184 Chronic kidney disease, stage 4 (severe): Secondary | ICD-10-CM | POA: Diagnosis present

## 2018-08-03 DIAGNOSIS — N183 Chronic kidney disease, stage 3 unspecified: Secondary | ICD-10-CM

## 2018-08-03 DIAGNOSIS — R54 Age-related physical debility: Secondary | ICD-10-CM | POA: Diagnosis present

## 2018-08-03 DIAGNOSIS — R0602 Shortness of breath: Secondary | ICD-10-CM

## 2018-08-03 DIAGNOSIS — Z8542 Personal history of malignant neoplasm of other parts of uterus: Secondary | ICD-10-CM | POA: Diagnosis not present

## 2018-08-03 DIAGNOSIS — I483 Typical atrial flutter: Secondary | ICD-10-CM

## 2018-08-03 DIAGNOSIS — N179 Acute kidney failure, unspecified: Secondary | ICD-10-CM | POA: Diagnosis present

## 2018-08-03 DIAGNOSIS — R600 Localized edema: Secondary | ICD-10-CM

## 2018-08-03 DIAGNOSIS — M549 Dorsalgia, unspecified: Secondary | ICD-10-CM | POA: Diagnosis present

## 2018-08-03 DIAGNOSIS — I4891 Unspecified atrial fibrillation: Secondary | ICD-10-CM | POA: Diagnosis present

## 2018-08-03 DIAGNOSIS — Z7901 Long term (current) use of anticoagulants: Secondary | ICD-10-CM

## 2018-08-03 DIAGNOSIS — G8929 Other chronic pain: Secondary | ICD-10-CM | POA: Diagnosis present

## 2018-08-03 DIAGNOSIS — I10 Essential (primary) hypertension: Secondary | ICD-10-CM

## 2018-08-03 DIAGNOSIS — I5033 Acute on chronic diastolic (congestive) heart failure: Secondary | ICD-10-CM | POA: Diagnosis present

## 2018-08-03 DIAGNOSIS — I340 Nonrheumatic mitral (valve) insufficiency: Secondary | ICD-10-CM | POA: Diagnosis not present

## 2018-08-03 DIAGNOSIS — Z880 Allergy status to penicillin: Secondary | ICD-10-CM

## 2018-08-03 DIAGNOSIS — I4430 Unspecified atrioventricular block: Secondary | ICD-10-CM | POA: Diagnosis present

## 2018-08-03 DIAGNOSIS — I5032 Chronic diastolic (congestive) heart failure: Secondary | ICD-10-CM | POA: Diagnosis present

## 2018-08-03 DIAGNOSIS — I484 Atypical atrial flutter: Secondary | ICD-10-CM | POA: Diagnosis not present

## 2018-08-03 DIAGNOSIS — K219 Gastro-esophageal reflux disease without esophagitis: Secondary | ICD-10-CM | POA: Diagnosis present

## 2018-08-03 DIAGNOSIS — I361 Nonrheumatic tricuspid (valve) insufficiency: Secondary | ICD-10-CM | POA: Diagnosis not present

## 2018-08-03 DIAGNOSIS — E7800 Pure hypercholesterolemia, unspecified: Secondary | ICD-10-CM

## 2018-08-03 DIAGNOSIS — I4892 Unspecified atrial flutter: Principal | ICD-10-CM | POA: Diagnosis present

## 2018-08-03 LAB — BASIC METABOLIC PANEL
Anion gap: 8 (ref 5–15)
BUN: 44 mg/dL — AB (ref 8–23)
CALCIUM: 9.5 mg/dL (ref 8.9–10.3)
CO2: 25 mmol/L (ref 22–32)
CREATININE: 1.81 mg/dL — AB (ref 0.44–1.00)
Chloride: 104 mmol/L (ref 98–111)
GFR calc non Af Amer: 24 mL/min — ABNORMAL LOW (ref >60)
GFR, EST AFRICAN AMERICAN: 27 mL/min — AB (ref >60)
Glucose, Bld: 115 mg/dL — ABNORMAL HIGH (ref 70–99)
Potassium: 4.2 mmol/L (ref 3.5–5.1)
SODIUM: 137 mmol/L (ref 135–145)

## 2018-08-03 LAB — CBC
HCT: 40.4 % (ref 35.0–47.0)
Hemoglobin: 13.6 g/dL (ref 12.0–16.0)
MCH: 31.5 pg (ref 26.0–34.0)
MCHC: 33.7 g/dL (ref 32.0–36.0)
MCV: 93.6 fL (ref 80.0–100.0)
PLATELETS: 206 10*3/uL (ref 150–440)
RBC: 4.32 MIL/uL (ref 3.80–5.20)
RDW: 14.4 % (ref 11.5–14.5)
WBC: 7.8 10*3/uL (ref 3.6–11.0)

## 2018-08-03 LAB — MAGNESIUM: Magnesium: 2.8 mg/dL — ABNORMAL HIGH (ref 1.7–2.4)

## 2018-08-03 LAB — TROPONIN I: Troponin I: 0.03 ng/mL (ref <0.03)

## 2018-08-03 MED ORDER — APIXABAN 2.5 MG PO TABS
2.5000 mg | ORAL_TABLET | Freq: Two times a day (BID) | ORAL | Status: DC
  Administered 2018-08-03 – 2018-08-07 (×8): 2.5 mg via ORAL
  Filled 2018-08-03 (×8): qty 1

## 2018-08-03 MED ORDER — DILTIAZEM HCL-DEXTROSE 100-5 MG/100ML-% IV SOLN (PREMIX): 5.0000 mg/h | INTRAVENOUS | Status: DC

## 2018-08-03 MED ORDER — ACETAMINOPHEN 650 MG RE SUPP: 650.0000 mg | Freq: Four times a day (QID) | RECTAL | Status: DC | PRN

## 2018-08-03 MED ORDER — ACETAMINOPHEN 325 MG PO TABS
650.0000 mg | ORAL_TABLET | Freq: Four times a day (QID) | ORAL | Status: DC | PRN
  Administered 2018-08-04 – 2018-08-06 (×3): 650 mg via ORAL
  Filled 2018-08-03 (×3): qty 2

## 2018-08-03 MED ORDER — ONDANSETRON HCL 4 MG PO TABS: 4.0000 mg | ORAL_TABLET | Freq: Four times a day (QID) | ORAL | Status: DC | PRN

## 2018-08-03 MED ORDER — DILTIAZEM HCL-DEXTROSE 100-5 MG/100ML-% IV SOLN (PREMIX)
5.0000 mg/h | INTRAVENOUS | Status: DC
  Administered 2018-08-03: 12.5 mg/h via INTRAVENOUS
  Administered 2018-08-03: 5 mg/h via INTRAVENOUS
  Administered 2018-08-03: 15 mg/h via INTRAVENOUS
  Administered 2018-08-03: 7.5 mg/h via INTRAVENOUS
  Administered 2018-08-03 – 2018-08-04 (×2): 10 mg/h via INTRAVENOUS
  Administered 2018-08-04 (×2): 15 mg/h via INTRAVENOUS
  Administered 2018-08-05: 7.5 mg/h via INTRAVENOUS
  Administered 2018-08-05: 5 mg/h via INTRAVENOUS
  Administered 2018-08-05 – 2018-08-06 (×2): 12.5 mg/h via INTRAVENOUS
  Administered 2018-08-06: 15 mg/h via INTRAVENOUS
  Filled 2018-08-03 (×8): qty 100

## 2018-08-03 MED ORDER — ONDANSETRON HCL 4 MG/2ML IJ SOLN: 4.0000 mg | Freq: Four times a day (QID) | INTRAMUSCULAR | Status: DC | PRN

## 2018-08-03 NOTE — Progress Notes (Signed)
Family Meeting Note  Patient came in from cardiology office with rapid heart rate and was found to be in atrial flutter new onset. She has history of hypertension macular degeneration chronic lower extremity edema. It will be started on IV diltiazem drip. Monitor over the weekend and cardioversion will be done if need be. Oral anticoagulants have been started after discussing with cardiology code status was address with patient. She will be a full code. She said she is going to think about it and address with the primary pending if she decides to make a change  No family in the room.  Time spent 16 minutes  Fritzi Mandes, MD

## 2018-08-03 NOTE — Telephone Encounter (Signed)
Patient calling stating is still having high BP was advised to call   7:45 am Took a Hydralazine 50 mg   8:55 am 149/94 HR 131   Would like a call back since it is still high

## 2018-08-03 NOTE — Progress Notes (Signed)
Patient arrived from Dr. Donivan Scull office, HR in 130's and SBP in 170's will start cardizem drip. Dr. Posey Pronto is here at bedside. RN will continue to monitor.

## 2018-08-03 NOTE — Progress Notes (Signed)
ANTICOAGULATION CONSULT NOTE - Initial Consult  Pharmacy Consult for apixaban Indication: Aflutter  Allergies  Allergen Reactions  . Clonidine   . Benicar [Olmesartan] Other (See Comments)    Lip swelling  . Celebrex [Celecoxib]   . Iodine     Avoids due to decreased kidney function  . Penicillins Rash    Has patient had a PCN reaction causing immediate rash, facial/tongue/throat swelling, SOB or lightheadedness with hypotension: Unknown Has patient had a PCN reaction causing severe rash involving mucus membranes or skin necrosis: Unknown Has patient had a PCN reaction that required hospitalization: Unknown Has patient had a PCN reaction occurring within the last 10 years: Unknown If all of the above answers are "NO", then may proceed with Cephalosporin use.     Patient Measurements: Height: 5\' 4"  (162.6 cm) Weight: 126 lb 9.6 oz (57.4 kg) IBW/kg (Calculated) : 54.7 Heparin Dosing Weight:    Vital Signs: Temp: 97.6 F (36.4 C) (09/06 1742) Temp Source: Oral (09/06 1742) BP: 146/94 (09/06 1915) Pulse Rate: 131 (09/06 1742)  Labs: No results for input(s): HGB, HCT, PLT, APTT, LABPROT, INR, HEPARINUNFRC, HEPRLOWMOCWT, CREATININE, CKTOTAL, CKMB, TROPONINI in the last 72 hours.  CrCl cannot be calculated (Patient's most recent lab result is older than the maximum 21 days allowed.).   Medical History: Past Medical History:  Diagnosis Date  . Sherran Needs syndrome   . Diastolic CHF, acute (HCC)    Echo (8/10) with EF 60-65%, mild LVH, mild to moderate TR, PASP 40 mmHg.  . Dizziness    Thought to be side effect of Norvasc  . GERD (gastroesophageal reflux disease)   . Glaucoma   . HTN (hypertension)   . Hypercalcemia    On HCTZ  . Hypercholesterolemia   . Hyperkalemia   . Macular degeneration   . Osteoporosis   . Renal fibromuscular dysplasia ([place])   . Renal insufficiency   . S/P cardiac catheterization 05/2002   Luminal irregularities only in the coronaries. EF  55%. There was some irregulatrity in the right renal artery suggestive of possible fibromuscular dysplasia. Myoview in 5/08 showed no ischemia or infarction. Lexiscan myoview at [facility] (10/10) showed EF 55%, normal wall motion, no evience ofor ischemia or infarction.  . Scoliosis associated with other condition    Chronic back pain  . Uterine cancer ([place])    S/P hypsterectomy in 2008  . Wears hearing aid    bilateral    Medications:  Scheduled:  . apixaban  2.5 mg Oral BID   Infusions:  . diltiazem (CARDIZEM) infusion 5 mg/hr (08/03/18 1834)    Assessment: 84 yo F with new onset Aflutter w/ RVR starting apixaban. No anticoagulants noted PTA. Wt 57.4 kg Scr pending   Goal of Therapy:   Monitor platelets by anticoagulation protocol: Yes   Plan:  Will continue current orders for Apixaban 2.5 mg po bid.   Dynver Clemson A 08/03/2018,7:20 PM

## 2018-08-03 NOTE — Consult Note (Signed)
Cardiology Consultation:   [name] MRN: [id number]; DOB: [birth year]  Admit date: 08/03/2018 Date of Consult: 08/03/2018  Primary Care Provider: Einar Pheasant, MD Primary Cardiologist: Rockey Situ Physician requesting consult: Dr. Posey Pronto Reason for consult: Atrial flutter with RVR    Patient Profile:   Jill Castro is a 84 y.o. female with a hx of chronic diastolic CHF presenting to outpatient office with new onset atrial flutter with rate 130 bpm, shortness of breath Sent to the hospital for rate and rhythm control  History of Present Illness:    Jill Castro is an 84 yo pleasant woman with history of  HTN  diastolic CHF,  chronic lower extremity edema (left leg in particular), severe scoliosis and chronic back pain and requires pain medication.  Husband passed frompancreatic cancer Presented to the office with shortness of breath, tachycardia   She reports having tachycardia shortness of breath for the past 2 days Similar episode 1 or 2 weeks ago but seem to resolve overnight "prayed for it to go away".    Resolved in the morning This episode has not resolved and she has shortness of breath on any exertion Very fearful Lives alone,  husband  passed away last year From the tachycardia she is very limited in her ability to ambulate Using a walker Friend brought her in today  Using her compression hose Feels the leg edema is stable  Chronic hallucinations still an issue, at night when she wakes up Macular degeneration in the right eye, occlusion on the left Has had injections Depressed or loss of her husband  [place] Food is poor  [employer] Son lives in [place] in [place]  Chronic back pain  EKG personally reviewed by myself on todays visit Shows atrial flutter with ventricular rate 130 bpm no significant ST or T wave changes  Other past medical history Chronic back pain, not a candidate for back  surgery Continued eye problem, macular degeneration. Baseline Creatinine 1.4 Tries to wear compression hose when she can.  catheterization in 2003 showing luminal irregularities, negative stress test in October 2010 with no ischemia, echocardiogram August 2010 showing normal systolic function, mild to moderate tricuspid regurgitation, mildly elevated right ventricular systolic pressures consistent with mild pulmonary hypertension, ejection fraction 60%.   Past Medical History:  Diagnosis Date  . Sherran Needs syndrome   . Diastolic CHF, acute (HCC)    Echo (8/10) with EF 60-65%, mild LVH, mild to moderate TR, PASP 40 mmHg.  . Dizziness    Thought to be side effect of Norvasc  . GERD (gastroesophageal reflux disease)   . Glaucoma   . HTN (hypertension)   . Hypercalcemia    On HCTZ  . Hypercholesterolemia   . Hyperkalemia   . Macular degeneration   . Osteoporosis   . Renal fibromuscular dysplasia ([place])   . Renal insufficiency   . S/P cardiac catheterization 05/2002   Luminal irregularities only in the coronaries. EF 55%. There was some irregulatrity in the right renal artery suggestive of possible fibromuscular dysplasia. Myoview in 5/08 showed no ischemia or infarction. Lexiscan [facility]iew at [facility] (10/10) showed EF 55%, normal wall motion, no evience ofor ischemia or infarction.  . Scoliosis associated with other condition    Chronic back pain  . Uterine cancer ([place])    S/P hypsterectomy in 2008  . Wears hearing aid    bilateral    Past Surgical History:  Procedure Laterality Date  . AQUEOUS SHUNT Left 01/30/2017  Procedure: AQUEOUS SHUNT ahmed tube;  Surgeon: Ronnell Freshwater, MD;  Location: [place];  Service: Ophthalmology;  Laterality: Left;  ahmed tube shunt with scleral patch graft  . CARDIAC CATHETERIZATION     Left  . CATARACT EXTRACTION Right 07/03/2013   [facility] Dr Wallace Going  . CATARACT EXTRACTION W/PHACO Left 01/30/2017   Procedure: CATARACT  EXTRACTION PHACO AND INTRAOCULAR LENS PLACEMENT ([place])  left;  Surgeon: Ronnell Freshwater, MD;  Location: [place];  Service: Ophthalmology;  Laterality: Left;  . REFRACTIVE SURGERY    . TOTAL ABDOMINAL HYSTERECTOMY W/ BILATERAL SALPINGOOPHORECTOMY     stage I C well differentiated adenocarcinoma     Home Medications:  Prior to Admission medications   Medication Sig Start Date End Date Taking? Authorizing Provider  Acetaminophen (TYLENOL PO) Take by mouth as needed.    [provider]  aspirin 81 MG EC tablet Take 81 mg by mouth daily.      [provider]  B Complex Vitamins (PA B-COMPLEX WITH B-12) TABS Take 1 tablet by mouth daily.      [provider]  Calcium Carbonate-Vitamin D (CALCIUM-VITAMIN D) 600-200 MG-UNIT CAPS Take by mouth. Takes 1/2 tablet daily.    [provider]  docusate sodium (STOOL SOFTENER) 100 MG capsule Take 100 mg by mouth daily as needed for mild constipation.    [provider]  furosemide (LASIX) 40 MG tablet TAKE ONE TABLET EVERY DAY 10/18/17   Minna Merritts, MD  Glucosamine-Chondroitin 1500-1200 MG/30ML LIQD Take by mouth. Takes 1/2 tablet daily.    [provider]  hydrALAZINE (APRESOLINE) 100 MG tablet Take 100 mg 1/2 tablet every morning and 100 mg tablet every evening. Patient taking differently: Take 1/2 tablet TID. 01/22/18   Minna Merritts, MD  latanoprost (XALATAN) 0.005 % ophthalmic solution Place 1 drop into the left eye at bedtime.  10/18/12   [provider]  losartan (COZAAR) 100 MG tablet Take 1 tablet (100 mg total) by mouth daily. 08/14/17   Minna Merritts, MD  Magnesium 250 MG TABS Take 250 mg by mouth daily.    [provider]  multivitamin [facility]) per tablet Take 1 tablet by mouth daily.      [provider]  nystatin cream (MYCOSTATIN) Apply 1 application topically 2 (two) times daily. 01/26/17   Einar Pheasant, MD  Polyethylene Glycol  3350 (MIRALAX PO) Take by mouth daily.     [provider]  ranitidine (ZANTAC) 150 MG tablet TAKE ONE TABLET BY MOUTH TWICE DAILY 10 TO 15 MIN BEFORE MEAL 05/01/17   [provider]  simvastatin (ZOCOR) 10 MG tablet Take 1 tablet (10 mg total) by mouth at bedtime. 08/14/17   Minna Merritts, MD    Inpatient Medications: Scheduled Meds: . apixaban  2.5 mg Oral BID   Continuous Infusions: . diltiazem (CARDIZEM) infusion     PRN Meds:   Allergies:    Allergies  Allergen Reactions  . Clonidine   . Benicar [Olmesartan] Other (See Comments)    Lip swelling  . Celebrex [Celecoxib]   . Iodine     Avoids due to decreased kidney function  . Penicillins Rash    Social History:   Social History   Socioeconomic History  . Marital status: Widowed    Spouse name: Not on file  . Number of children: 2  . Years of education: Not on file  . Highest education level: Not on file  Occupational History  .  Not on file  Social Needs  . Financial resource strain: Not hard at all  . Food insecurity:    Worry: Never true    Inability: Never true  . Transportation needs:    Medical: No    Non-medical: No  Tobacco Use  . Smoking status: Never Smoker  . Smokeless tobacco: Never Used  Substance and Sexual Activity  . Alcohol use: No    Alcohol/week: 0.0 standard drinks  . Drug use: No  . Sexual activity: Not on file  Lifestyle  . Physical activity:    Days per week: Not on file    Minutes per session: Not on file  . Stress: Not on file  Relationships  . Social connections:    Talks on phone: Not on file    Gets together: Not on file    Attends religious service: Not on file    Active member of club or organization: Not on file    Attends meetings of clubs or organizations: Not on file    Relationship status: Not on file  . Intimate partner violence:    Fear of current or ex partner: Not on file    Emotionally abused: Not on file    Physically abused: Not on  file    Forced sexual activity: Not on file  Other Topics Concern  . Not on file  Social History Narrative  . Not on file    Family History:    Family History  Problem Relation Age of Onset  . Heart disease Father   . Heart disease Mother   . Parkinson's disease Mother   . Diabetes Cousin      ROS:  Please see the history of present illness.  Review of Systems  Constitutional: Positive for malaise/fatigue.  Respiratory: Positive for shortness of breath.   Cardiovascular: Positive for palpitations.  Gastrointestinal: Negative.   Musculoskeletal: Negative.   Neurological: Negative.   Psychiatric/Behavioral: Negative.   All other systems reviewed and are negative.    Physical Exam/Data:   Vitals:   08/03/18 1742  BP: (!) 170/98  Pulse: (!) 131  Temp: 97.6 F (36.4 C)  TempSrc: Oral  SpO2: 99%   No intake or output data in the 24 hours ending 08/03/18 1825 There were no vitals filed for this visit. There is no height or weight on file to calculate BMI.  General:  Well nourished, well developed, in no acute distress HEENT: normal Lymph: no adenopathy Neck: no JVD Endocrine:  No thryomegaly Vascular: No carotid bruits; FA pulses 2+ bilaterally without bruits  Cardiac: Tachycardia, regular rate, rapid Lungs:  clear to auscultation bilaterally, no wheezing, rhonchi or rales  Abd: soft, nontender, no hepatomegaly  Ext: no edema Musculoskeletal:  No deformities, BUE and BLE strength normal and equal Skin: warm and dry  Neuro:  CNs 2-12 intact, no focal abnormalities noted Psych:  Normal affect   EKG:  The EKG was personally reviewed and demonstrates:   Atrial flutter ventricular rate 130 bpm no significant ST or T wave changes  Relevant CV Studies:   Laboratory Data:  ChemistryNo results for input(s): NA, K, CL, CO2, GLUCOSE, BUN, CREATININE, CALCIUM, GFRNONAA, GFRAA, ANIONGAP in the last 168 hours.  No results for input(s): PROT, ALBUMIN, AST, ALT, ALKPHOS,  BILITOT in the last 168 hours. HematologyNo results for input(s): WBC, RBC, HGB, HCT, MCV, MCH, MCHC, RDW, PLT in the last 168 hours. Cardiac EnzymesNo results for input(s): TROPONINI in the last 168 hours. No results for  input(s): TROPIPOC in the last 168 hours.  BNPNo results for input(s): BNP, PROBNP in the last 168 hours.  DDimer No results for input(s): DDIMER in the last 168 hours.  Radiology/Studies:  No results found.  Assessment and Plan:   New onset atrial flutter with RVR Worsening shortness of breath, debilitated state Lives alone, very elderly and frail As it is Friday evening and she lives alone and having worsening symptoms recommended we admit her to the hospital for rate and possibly rhythm control -Case discussed with Dr. Posey Pronto, specialist Room has been requested Would recommend diltiazem infusion 5 mg an hour We could hold hydralazine and losartan She does have chronic lower extremity edema and diltiazem may not be a good medication for long-term use.  Ideally would start metoprolol as blood pressure allows -Elliquis 2.5 BID Creatinine clearance 22 Creatinine is 1.6  Hypercholesterolemia We'll continue low-dose simvastatin Stable  DIASTOLIC HEART FAILURE, CHRONIC Reports that she has been stable until recently in the setting of tachycardia New onset atrial flutter rate 130 bpm  Essential hypertension Will need to start rate control medications for tachycardia Would hold losartan and hydralazine until arrhythmia improves  Edema, unspecified type Consistent with venous insufficiency,  continue compression hose and leg elevation  Chronic kidney disease (CKD), stage III (moderate) Lasix  3-4 times per week Creatinine 1.6  Visual hallucinations Etiology of her visual hallucinations is not clear Seem to happen after loss of her husband Suspect neurologic issue    For questions or updates, please contact [facility] Please consult [email or web address]  for contact info under     Signed, Ida Rogue, MD  08/03/2018 6:25 PM

## 2018-08-03 NOTE — H&P (Signed)
[facility] [place] NAME: Jill Castro    MR#:  [id number]  DATE OF BIRTH:  July 27, 1927  DATE OF ADMISSION:  08/03/2018  PRIMARY CARE PHYSICIAN: Einar Pheasant, MD   REQUESTING/REFERRING PHYSICIAN: Dr. Rockey Situ  CHIEF COMPLAINT:  elevated heart rate and shortness of breath pt is sent as a direct admit from cardiology office  HISTORY OF PRESENT ILLNESS:  Jill Castro  is a 84 y.o. female with a known history of chronic diastolic congestive heart failure, hypertension, hyperlipidemia and chronic lower extremity edema comes from Dr. Donivan Scull office when she was found to have heart rate of hundred 30 with atrial flutter on EKG which is new onset. Patient also complained of shortness of breath. Blood pressure is stable. She is being admitted for further evaluation and management  No family in the room  PAST MEDICAL HISTORY:   Past Medical History:  Diagnosis Date  . Sherran Needs syndrome   . Diastolic CHF, acute (HCC)    Echo (8/10) with EF 60-65%, mild LVH, mild to moderate TR, PASP 40 mmHg.  . Dizziness    Thought to be side effect of Norvasc  . GERD (gastroesophageal reflux disease)   . Glaucoma   . HTN (hypertension)   . Hypercalcemia    On HCTZ  . Hypercholesterolemia   . Hyperkalemia   . Macular degeneration   . Osteoporosis   . Renal fibromuscular dysplasia ([place])   . Renal insufficiency   . S/P cardiac catheterization 05/2002   Luminal irregularities only in the coronaries. EF 55%. There was some irregulatrity in the right renal artery suggestive of possible fibromuscular dysplasia. Myoview in 5/08 showed no ischemia or infarction. Lexiscan myoview at [facility] (10/10) showed EF 55%, normal wall motion, no evience ofor ischemia or infarction.  . Scoliosis associated with other condition    Chronic back pain  . Uterine cancer ([place])    S/P hypsterectomy in 2008  . Wears hearing aid    bilateral    PAST SURGICAL  HISTOIRY:   Past Surgical History:  Procedure Laterality Date  . AQUEOUS SHUNT Left 01/30/2017   Procedure: AQUEOUS SHUNT ahmed tube;  Surgeon: Ronnell Freshwater, MD;  Location: [facility];  Service: Ophthalmology;  Laterality: Left;  ahmed tube shunt with scleral patch graft  . CARDIAC CATHETERIZATION     Left  . CATARACT EXTRACTION Right 07/03/2013   [facility] Dr Wallace Going  . CATARACT EXTRACTION W/PHACO Left 01/30/2017   Procedure: CATARACT EXTRACTION PHACO AND INTRAOCULAR LENS PLACEMENT ([place])  left;  Surgeon: Ronnell Freshwater, MD;  Location: [facility];  Service: Ophthalmology;  Laterality: Left;  . REFRACTIVE SURGERY    . TOTAL ABDOMINAL HYSTERECTOMY W/ BILATERAL SALPINGOOPHORECTOMY     stage I C well differentiated adenocarcinoma    SOCIAL HISTORY:   Social History   Tobacco Use  . Smoking status: Never Smoker  . Smokeless tobacco: Never Used  Substance Use Topics  . Alcohol use: No    Alcohol/week: 0.0 standard drinks    FAMILY HISTORY:   Family History  Problem Relation Age of Onset  . Heart disease Father   . Heart disease Mother   . Parkinson's disease Mother   . Diabetes Cousin     DRUG ALLERGIES:   Allergies  Allergen Reactions  . Clonidine   . Benicar [Olmesartan] Other (See Comments)    Lip swelling  . Celebrex [Celecoxib]   . Iodine     Avoids due  to decreased kidney function  . Penicillins Rash    REVIEW OF SYSTEMS:  Review of Systems  Constitutional: Negative for chills, fever and weight loss.  HENT: Negative for ear discharge, ear pain and nosebleeds.   Eyes: Negative for blurred vision, pain and discharge.  Respiratory: Positive for shortness of breath. Negative for sputum production, wheezing and stridor.   Cardiovascular: Positive for palpitations. Negative for chest pain, orthopnea and PND.  Gastrointestinal: Negative for abdominal pain, diarrhea, nausea and vomiting.  Genitourinary: Negative for frequency and  urgency.  Musculoskeletal: Negative for back pain and joint pain.  Neurological: Negative for sensory change, speech change, focal weakness and weakness.  Psychiatric/Behavioral: Negative for depression and hallucinations. The patient is not nervous/anxious.      MEDICATIONS AT HOME:   Prior to Admission medications   Medication Sig Start Date End Date Taking? Authorizing Provider  Acetaminophen (TYLENOL PO) Take by mouth as needed.    [provider]  aspirin 81 MG EC tablet Take 81 mg by mouth daily.      [provider]  B Complex Vitamins (PA B-COMPLEX WITH B-12) TABS Take 1 tablet by mouth daily.      [provider]  Calcium Carbonate-Vitamin D (CALCIUM-VITAMIN D) 600-200 MG-UNIT CAPS Take by mouth. Takes 1/2 tablet daily.    [provider]  docusate sodium (STOOL SOFTENER) 100 MG capsule Take 100 mg by mouth daily as needed for mild constipation.    [provider]  furosemide (LASIX) 40 MG tablet TAKE ONE TABLET EVERY DAY 10/18/17   Minna Merritts, MD  Glucosamine-Chondroitin 1500-1200 MG/30ML LIQD Take by mouth. Takes 1/2 tablet daily.    [provider]  hydrALAZINE (APRESOLINE) 100 MG tablet Take 100 mg 1/2 tablet every morning and 100 mg tablet every evening. Patient taking differently: Take 1/2 tablet TID. 01/22/18   Minna Merritts, MD  latanoprost (XALATAN) 0.005 % ophthalmic solution Place 1 drop into the left eye at bedtime.  10/18/12   [provider]  losartan (COZAAR) 100 MG tablet Take 1 tablet (100 mg total) by mouth daily. 08/14/17   Minna Merritts, MD  Magnesium 250 MG TABS Take 250 mg by mouth daily.    [provider]  multivitamin [facility]) per tablet Take 1 tablet by mouth daily.      [provider]  nystatin cream (MYCOSTATIN) Apply 1 application topically 2 (two) times daily. 01/26/17   Einar Pheasant, MD  Polyethylene Glycol 3350 (MIRALAX PO) Take by mouth daily.      [provider]  ranitidine (ZANTAC) 150 MG tablet TAKE ONE TABLET BY MOUTH TWICE DAILY 10 TO 15 MIN BEFORE MEAL 05/01/17   [provider]  simvastatin (ZOCOR) 10 MG tablet Take 1 tablet (10 mg total) by mouth at bedtime. 08/14/17   Minna Merritts, MD      VITAL SIGNS:  Blood pressure (!) 170/98, pulse (!) 131, temperature 97.6 F (36.4 C), temperature source Oral, height 5\' 4"  (1.626 m), weight 57.4 kg, SpO2 99 %.  PHYSICAL EXAMINATION:  GENERAL:  84 y.o.-year-old patient lying in the bed with no acute distress. thin EYES: Pupils equal, round, reactive to light and accommodation. No scleral icterus. Extraocular muscles intact.  HEENT: Head atraumatic, normocephalic. Oropharynx and nasopharynx clear.  NECK:  Supple, no jugular venous distention. No thyroid enlargement, no tenderness.  LUNGS: Normal breath sounds bilaterally, no wheezing, rales,rhonchi or crepitation. No use of accessory muscles of respiration.  CARDIOVASCULAR: S1, S2  normal. No murmurs, rubs, or gallops. Tachycardia++ ABDOMEN: Soft, nontender, nondistended. Bowel sounds present. No organomegaly or mass.  EXTREMITIES: No pedal edema, cyanosis, or clubbing.  NEUROLOGIC: Cranial nerves II through XII are intact. Muscle strength 5/5 in all extremities. Sensation intact. Gait not checked.  PSYCHIATRIC: The patient is alert and oriented x 3.  SKIN: No obvious rash, lesion, or ulcer.   LABORATORY PANEL:   CBC No results for input(s): WBC, HGB, HCT, PLT in the last 168 hours. ------------------------------------------------------------------------------------------------------------------  Chemistries  No results for input(s): NA, K, CL, CO2, GLUCOSE, BUN, CREATININE, CALCIUM, MG, AST, ALT, ALKPHOS, BILITOT in the last 168 hours.  Invalid input(s): GFRCGP ------------------------------------------------------------------------------------------------------------------  Cardiac Enzymes No results for  input(s): TROPONINI in the last 168 hours. ------------------------------------------------------------------------------------------------------------------  RADIOLOGY:  No results found.  EKG:  sinus tachycardia  IMPRESSION AND PLAN:   Jill Castro  is a 84 y.o. female with a known history of chronic diastolic congestive heart failure, hypertension, hyperlipidemia and chronic lower extremity edema comes from Dr. Donivan Scull office when she was found to have heart rate of hundred 30 with atrial flutter on EKG which is new onset. Patient also complained of shortness of breath. Blood pressure is stable. She is being admitted for further evaluation and management  1. atrial flutter new onset -admit to telemetry -presented with heart rate in the 130s from cardiology office -discussed with Dr. Rockey Situ recommends IV Cardizem drip -start patient on eliquis 2.5 mg BID -pharmacy consultation placed -check CBC, basic metabolic panel, magnesium, EKG, troponin  2. acute lower extremity edema continue Lasix   3. Macular degeneration continue eyedrops  4. Hypertension since patient is on IV diltiazem drip resume home meds according to blood pressure  5. DVT prophylaxis on eliquis  All the records are reviewed and case discussed with ED provider. Management plans discussed with the patient, family and they are in agreement.  CODE STATUS: *full code*  TOTAL CRITICAL TIME TAKING CARE OF THIS PATIENT: *50* minutes.    Fritzi Mandes M.D on 08/03/2018 at 7:02 PM  Between 7am to 6pm - Pager - [phone]  After 6pm go to [email or web address] - password [email or web address] [facility]  [facility]  Office  [phone]  CC: Primary care physician; Einar Pheasant, MD

## 2018-08-03 NOTE — Telephone Encounter (Signed)
Patient scheduled to come in today for appointment with provider.

## 2018-08-03 NOTE — Progress Notes (Addendum)
Cardiology Office Note  Date:  08/03/2018   ID:  ONDREA DOW, DOB Apr 08, 1927, MRN [id number]  PCP:  Einar Pheasant, MD   Chief Complaint  Patient presents with  . OTHER    BP Issues , edema and rapid heart beat. Meds reviewed verbally with pt.    HPI:  Ms. Staron is an 84 yo pleasant woman with history of  HTN  diastolic CHF,  chronic lower extremity edema (left leg in particular), severe scoliosis and chronic back pain and requires pain medication.  Husband passed from pancreatic cancer presents for followup of her hypertension and diastolic CHF   She reports having tachycardia shortness of breath for the past 2 days Similar episode 1 or 2 weeks ago but seem to resolve overnight " prayed for it to go away".  Resolved in the morning This episode has not resolved and she has shortness of breath on any exertion Very fearful Lives alone, husband  passed away last year From the tachycardia she is very limited in her ability to ambulate Using a walker Friend brought her in today  Using her compression hose Feels the leg edema is stable  Chronic hallucinations still an issue, at night when she wakes up Macular degeneration in the right eye, occlusion on the left Has had injections Depressed or loss of her husband  [place] Food is poor  [employer] Son lives in [place], daughter in [place]  Chronic back pain  EKG personally reviewed by myself on todays visit Shows atrial flutter with ventricular rate 130 bpm no significant ST or T wave changes  Other past medical history Chronic back pain, not a candidate for back surgery Continued eye problem, macular degeneration. Baseline Creatinine 1.4 Tries to wear compression hose when she can.  catheterization in 2003 showing luminal irregularities, negative stress test in October 2010 with no ischemia, echocardiogram August 2010 showing normal systolic function, mild to moderate tricuspid  regurgitation, mildly elevated right ventricular systolic pressures consistent with mild pulmonary hypertension, ejection fraction 60%.  PMH:   has a past medical history of Sherran Needs syndrome, Diastolic CHF, acute ([place]), Dizziness, GERD (gastroesophageal reflux disease), Glaucoma, HTN (hypertension), Hypercalcemia, Hypercholesterolemia, Hyperkalemia, Macular degeneration, Osteoporosis, Renal fibromuscular dysplasia ([place]), Renal insufficiency, S/P cardiac catheterization (05/2002), Scoliosis associated with other condition, Uterine cancer ([place]), and Wears hearing aid.  PSH:    Past Surgical History:  Procedure Laterality Date  . AQUEOUS SHUNT Left 01/30/2017   Procedure: AQUEOUS SHUNT ahmed tube;  Surgeon: Ronnell Freshwater, MD;  Location: [facility];  Service: Ophthalmology;  Laterality: Left;  ahmed tube shunt with scleral patch graft  . CARDIAC CATHETERIZATION     Left  . CATARACT EXTRACTION Right 07/03/2013   [facility] Dr Wallace Going  . CATARACT EXTRACTION W/PHACO Left 01/30/2017   Procedure: CATARACT EXTRACTION PHACO AND INTRAOCULAR LENS PLACEMENT ([place])  left;  Surgeon: Ronnell Freshwater, MD;  Location: [facility];  Service: Ophthalmology;  Laterality: Left;  . REFRACTIVE SURGERY    . TOTAL ABDOMINAL HYSTERECTOMY W/ BILATERAL SALPINGOOPHORECTOMY     stage I C well differentiated adenocarcinoma    Current Outpatient Medications  Medication Sig Dispense Refill  . Acetaminophen (TYLENOL PO) Take by mouth as needed.    Marland Kitchen aspirin 81 MG EC tablet Take 81 mg by mouth daily.      . B Complex Vitamins (PA B-COMPLEX WITH B-12) TABS Take 1 tablet by mouth daily.      . Calcium Carbonate-Vitamin D (CALCIUM-VITAMIN D)  600-200 MG-UNIT CAPS Take by mouth. Takes 1/2 tablet daily.    Marland Kitchen docusate sodium (STOOL SOFTENER) 100 MG capsule Take 100 mg by mouth daily as needed for mild constipation.    . furosemide (LASIX) 40 MG tablet TAKE ONE TABLET EVERY DAY 30 tablet 3  .  Glucosamine-Chondroitin 1500-1200 MG/30ML LIQD Take by mouth. Takes 1/2 tablet daily.    . hydrALAZINE (APRESOLINE) 100 MG tablet Take 100 mg 1/2 tablet every morning and 100 mg tablet every evening. (Patient taking differently: Take 1/2 tablet TID.) 135 tablet 3  . latanoprost (XALATAN) 0.005 % ophthalmic solution Place 1 drop into the left eye at bedtime.     Marland Kitchen losartan (COZAAR) 100 MG tablet Take 1 tablet (100 mg total) by mouth daily. 90 tablet 3  . Magnesium 250 MG TABS Take 250 mg by mouth daily.    . multivitamin (THERAGRAN) per tablet Take 1 tablet by mouth daily.      Marland Kitchen nystatin cream (MYCOSTATIN) Apply 1 application topically 2 (two) times daily. 30 g 0  . Polyethylene Glycol 3350 (MIRALAX PO) Take by mouth daily.     . ranitidine (ZANTAC) 150 MG tablet TAKE ONE TABLET BY MOUTH TWICE DAILY 10 TO 15 MIN BEFORE MEAL    . simvastatin (ZOCOR) 10 MG tablet Take 1 tablet (10 mg total) by mouth at bedtime. 90 tablet 3   No current facility-administered medications for this visit.      Allergies:   Clonidine; Benicar [olmesartan]; Celebrex [celecoxib]; Iodine; and Penicillins   Social History:  The patient  reports that she has never smoked. She has never used smokeless tobacco. She reports that she does not drink alcohol or use drugs.   Family History:   family history includes Diabetes in her cousin; Heart disease in her father and mother; Parkinson's disease in her mother.    Review of Systems: Review of Systems  Constitutional: Positive for malaise/fatigue.  Respiratory: Positive for shortness of breath.   Cardiovascular: Negative.   Gastrointestinal: Negative.   Musculoskeletal: Negative.   Neurological: Negative.   Psychiatric/Behavioral: Negative.   All other systems reviewed and are negative.    PHYSICAL EXAM: VS:  BP 118/90 (BP Location: Left Arm, Patient Position: Sitting, Cuff Size: Normal)   Pulse (!) 129   Ht 5\' 4"  (1.626 m)   Wt 127 lb 12 oz (57.9 kg)   BMI  21.93 kg/m  , BMI Body mass index is 21.93 kg/m. Constitutional: frail,  kyphosis, unsteady gait, presenting with a walker HENT:  Head: Normocephalic and atraumatic.  Eyes:  no discharge. No scleral icterus.  Neck: Normal range of motion. Neck supple. No JVD present.  Cardiovascular: Tachycardic, regular,  intact distal pulses. Exam reveals no gallop and no friction rub.  Trace pitting edema compression hose in place No murmur heard. Pulmonary/Chest: decreased inspiratory effort,  breath sounds normal. No stridor. No respiratory distress.  no wheezes.  no rales.  no tenderness.  Abdominal: Soft.  no distension.  no tenderness.  Musculoskeletal: Normal range of motion.  no  tenderness or deformity.  Neurological:  normal muscle tone. Coordination normal. No atrophy Skin: Skin is warm and dry. No rash noted. not diaphoretic.  Psychiatric:  normal mood and affect. behavior is normal. Thought content normal.    Recent Labs: 05/17/2018: BUN 43; Creatinine 1.6; Hemoglobin 12.1; Platelets 206; Potassium 4.9; Sodium 137    Lipid Panel Lab Results  Component Value Date   CHOL 169 08/13/2017   HDL 81 08/13/2017  LDLCALC 72 08/13/2017   TRIG 81 08/13/2017      Wt Readings from Last 3 Encounters:  08/03/18 127 lb 12 oz (57.9 kg)  06/26/18 129 lb 4 oz (58.6 kg)  06/26/18 128 lb 8 oz (58.3 kg)       ASSESSMENT AND PLAN:  New onset atrial flutter with RVR Worsening shortness of breath, debilitated state Lives alone, very elderly and frail As it is Friday evening and she lives alone and having worsening symptoms recommended we admit her to the hospital for rate and possibly rhythm control -Case discussed with Dr. Posey Pronto, specialist Room has been requested Would recommend diltiazem infusion 5 mg an hour We could hold hydralazine and losartan She does have chronic lower extremity edema and diltiazem may not be a good medication for long-term use.  Ideally would start metoprolol as  blood pressure allows -Elliquis 2.5 BID Creatinine clearance 22 Creatinine is 1.6  Hypercholesterolemia We'll continue low-dose simvastatin Stable  DIASTOLIC HEART FAILURE, CHRONIC Reports that she has been stable until recently in the setting of tachycardia New onset atrial flutter rate 130 bpm  Essential hypertension Will need to start rate control medications for tachycardia Would hold losartan and hydralazine until arrhythmia improves  Edema, unspecified type Consistent with venous insufficiency,  continue compression hose and leg elevation  Chronic kidney disease (CKD), stage III (moderate) Lasix  3-4 times per week Creatinine 1.6  Visual hallucinations Etiology of her visual hallucinations is not clear Seem to happen after loss of her husband Suspect neurologic issue  Disposition:   She will be sent to the hospital for admission   Total encounter time more than 45 minutes  Greater than 50% was spent in counseling and coordination of care with the patient    Orders Placed This Encounter  Procedures  . EKG 12-Lead     Signed, Esmond Plants, M.D., Ph.D. 08/03/2018  [place], [place]

## 2018-08-03 NOTE — Patient Instructions (Addendum)
Please call next week with heart rate and blood pressure numbers  Medication Instructions:   Please stop the hydralazine  Start metoprolol 50 mg twice a day  Please start eliquis 2.5 mg twice a day Stop the aspirin  Labwork:  No new labs needed  Testing/Procedures:  No further testing at this time   Follow-Up: It was a pleasure seeing you in the office today. Please call us if you have new issues that need to be addressed before your next appt.  [phone]  Your physician wants you to follow-up in: 2 weeks   If you need a refill on your cardiac medications before your next appointment, please call your pharmacy.  For educational health videos Log in to : [email or web address] Or : [email or web address], password : [email or web address]

## 2018-08-04 DIAGNOSIS — I484 Atypical atrial flutter: Secondary | ICD-10-CM

## 2018-08-04 MED ORDER — LATANOPROST 0.005 % OP SOLN
1.0000 [drp] | Freq: Every day | OPHTHALMIC | Status: DC
  Filled 2018-08-04: qty 2.5

## 2018-08-04 MED ORDER — DIGOXIN 0.25 MG/ML IJ SOLN
0.1250 mg | Freq: Every day | INTRAMUSCULAR | Status: DC
  Administered 2018-08-04: 0.125 mg via INTRAVENOUS
  Filled 2018-08-04: qty 2

## 2018-08-04 MED ORDER — DIGOXIN 0.25 MG/ML IJ SOLN: 0.1250 mg | Freq: Every day | INTRAMUSCULAR | Status: DC

## 2018-08-04 MED ORDER — METOPROLOL TARTRATE 25 MG PO TABS
25.0000 mg | ORAL_TABLET | Freq: Once | ORAL | Status: AC
  Administered 2018-08-04: 25 mg via ORAL
  Filled 2018-08-04: qty 1

## 2018-08-04 MED ORDER — DORZOLAMIDE HCL-TIMOLOL MAL 2-0.5 % OP SOLN
1.0000 [drp] | Freq: Two times a day (BID) | OPHTHALMIC | Status: DC
  Administered 2018-08-04 – 2018-08-07 (×7): 1 [drp] via OPHTHALMIC
  Filled 2018-08-04: qty 10

## 2018-08-04 MED ORDER — METOPROLOL SUCCINATE ER 25 MG PO TB24
25.0000 mg | ORAL_TABLET | Freq: Every day | ORAL | Status: DC
  Administered 2018-08-04 – 2018-08-05 (×2): 25 mg via ORAL
  Filled 2018-08-04 (×2): qty 1

## 2018-08-04 NOTE — Plan of Care (Signed)
  Problem: Clinical Measurements: Goal: Ability to maintain clinical measurements within normal limits will improve Outcome: Not Progressing Note:  Patient's H.R. very erratic on telemetry, remains on Cardizem drip (now at 10/hr.) At times it will be in the 70's, at other times it's well above 100 (now at 125). Will continue to monitor and titrate as appropriate. Wenda Low [facility]

## 2018-08-04 NOTE — Plan of Care (Signed)
  Problem: Education: Goal: Knowledge of General Education information will improve Description Including pain rating scale, medication(s)/side effects and non-pharmacologic comfort measures 08/04/2018 2243 by Hartford Poli, RN Outcome: Progressing 08/04/2018 2240 by Hartford Poli, RN Outcome: Progressing   Problem: Health Behavior/Discharge Planning: Goal: Ability to manage health-related needs will improve 08/04/2018 2243 by Hartford Poli, RN Outcome: Progressing 08/04/2018 2240 by Hartford Poli, RN Outcome: Progressing   Problem: Clinical Measurements: Goal: Ability to maintain clinical measurements within normal limits will improve 08/04/2018 2243 by Hartford Poli, RN Outcome: Progressing 08/04/2018 2240 by Hartford Poli, RN Outcome: Progressing Goal: Will remain free from infection 08/04/2018 2243 by Hartford Poli, RN Outcome: Progressing 08/04/2018 2240 by Hartford Poli, RN Outcome: Progressing Goal: Diagnostic test results will improve 08/04/2018 2243 by Hartford Poli, RN Outcome: Progressing 08/04/2018 2240 by Hartford Poli, RN Outcome: Progressing Goal: Respiratory complications will improve 08/04/2018 2243 by Hartford Poli, RN Outcome: Progressing 08/04/2018 2240 by Hartford Poli, RN Outcome: Progressing Goal: Cardiovascular complication will be avoided 08/04/2018 2243 by Hartford Poli, RN Outcome: Progressing 08/04/2018 2240 by Hartford Poli, RN Outcome: Progressing   Problem: Activity: Goal: Risk for activity intolerance will decrease 08/04/2018 2243 by Hartford Poli, RN Outcome: Progressing 08/04/2018 2240 by Hartford Poli, RN Outcome: Progressing   Problem: Nutrition: Goal: Adequate nutrition will be maintained 08/04/2018 2243 by Hartford Poli, RN Outcome: Progressing 08/04/2018 2240 by Hartford Poli, RN Outcome: Progressing   Problem: Coping: Goal: Level of anxiety will decrease 08/04/2018 2243 by Hartford Poli, RN Outcome: Progressing 08/04/2018 2240 by Hartford Poli, RN Outcome: Progressing   Problem: Elimination: Goal: Will not experience complications related to bowel motility 08/04/2018 2243 by Hartford Poli, RN Outcome: Progressing 08/04/2018 2240 by Hartford Poli, RN Outcome: Progressing Goal: Will not experience complications related to urinary retention 08/04/2018 2243 by Hartford Poli, RN Outcome: Progressing 08/04/2018 2240 by Hartford Poli, RN Outcome: Progressing   Problem: Pain Managment: Goal: General experience of comfort will improve 08/04/2018 2243 by Hartford Poli, RN Outcome: Progressing 08/04/2018 2240 by Hartford Poli, RN Outcome: Progressing   Problem: Safety: Goal: Ability to remain free from injury will improve 08/04/2018 2243 by Hartford Poli, RN Outcome: Progressing 08/04/2018 2240 by Hartford Poli, RN Outcome: Progressing   Problem: Skin Integrity: Goal: Risk for impaired skin integrity will decrease 08/04/2018 2243 by Hartford Poli, RN Outcome: Progressing 08/04/2018 2240 by Hartford Poli, RN Outcome: Progressing   Problem: Spiritual Needs Goal: Ability to function at adequate level 08/04/2018 2243 by Hartford Poli, RN Outcome: Progressing 08/04/2018 2240 by Hartford Poli, RN Outcome: Progressing

## 2018-08-04 NOTE — Plan of Care (Signed)

## 2018-08-04 NOTE — Progress Notes (Addendum)
Patient ID: SYMPHANI ECKSTROM, female   DOB: 02-14-1927, 84 y.o.   MRN: [id number]   Progress Note  Patient Name: Jill Castro Date of Encounter: 08/04/2018  Primary Cardiologist: Dr. Rockey Situ  Subjective   Feeling ok today.  Not feeling palpitations as much.  HR in 120s, atrial flutter on diltiazem 5 mg/hr.   Inpatient Medications    Scheduled Meds: . apixaban  2.5 mg Oral BID  . digoxin  0.125 mg Intravenous Daily  . dorzolamide-timolol  1 drop Both Eyes BID   Continuous Infusions: . diltiazem (CARDIZEM) infusion 5 mg/hr (08/04/18 0924)   PRN Meds: acetaminophen **OR** acetaminophen, ondansetron **OR** ondansetron (ZOFRAN) IV   Vital Signs    Vitals:   08/04/18 0646 08/04/18 0750 08/04/18 0900 08/04/18 0931  BP: 124/84  111/67 (!) 112/97  Pulse: (!) 115 68 (!) 125 (!) 123  Resp:      Temp:  98.1 F (36.7 C)    TempSrc:  Oral    SpO2:  97%    Weight:      Height:        Intake/Output Summary (Last 24 hours) at 08/04/2018 1324 Last data filed at 08/04/2018 0446 Gross per 24 hour  Intake 113.93 ml  Output 750 ml  Net -636.07 ml   Filed Weights   08/03/18 1742 08/04/18 0314  Weight: 57.4 kg 56.2 kg    Telemetry    Atrial flutter, rate in 120s - Personally Reviewed  Physical Exam   General: NAD Neck: JVP 8-9 cm, no thyromegaly or thyroid nodule.  Lungs: Clear to auscultation bilaterally with normal respiratory effort. CV: Nondisplaced PMI.  Heart tachy, regular S1/S2, no S3/S4, no murmur.  No peripheral edema.   Abdomen: Soft, nontender, no hepatosplenomegaly, no distention.  Skin: Intact without lesions or rashes.  Neurologic: Alert and oriented x 3.  Psych: Normal affect. Extremities: No clubbing or cyanosis.  HEENT: Normal.   Labs    Chemistry Recent Labs  Lab 08/03/18 1917  NA 137  K 4.2  CL 104  CO2 25  GLUCOSE 115*  BUN 44*  CREATININE 1.81*  CALCIUM 9.5  GFRNONAA 24*  GFRAA 27*  ANIONGAP 8     Hematology Recent Labs  Lab  08/03/18 1917  WBC 7.8  RBC 4.32  HGB 13.6  HCT 40.4  MCV 93.6  MCH 31.5  MCHC 33.7  RDW 14.4  PLT 206    Cardiac Enzymes Recent Labs  Lab 08/03/18 1917  TROPONINI 0.03*   No results for input(s): TROPIPOC in the last 168 hours.   BNPNo results for input(s): BNP, PROBNP in the last 168 hours.   DDimer No results for input(s): DDIMER in the last 168 hours.   Radiology    No results found.   Patient Profile     84 y.o. female with chronic diastolic CHF, HTN, CKD stage 3-4 was admitted with atrial flutter/RVR (new onset).   Assessment & Plan    1. Atrial flutter: HR in 120s today, not rate-controlled.   - Continue apixaban, dosed 2.5 mg bid with age and elevated creatinine.   - Increase diltiazem gtt to 10 mg/hr with HR in 120s.  - Would stop digoxin given age, gender, normal EF, and CKD IV. Can start Toprol XL 25 mg daily instead and titrate up as needed.  - I suspect she will end up needing cardioversion.  She would have to have a TEE prior.  This would be on Monday.  I discussed the  possibility of needing TEE-guided DCCV with patient today.  - Will get echo.  2. Chronic diastolic CHF: She is not markedly volume overloaded.  - Echo as above.  3. CKD: Stage 3-4, follow closely (ordered BMET for am).   For questions or updates, please contact [place] Please consult [email or web address] for contact info under    Signed, Loralie Champagne, MD  08/04/2018, 1:24 PM

## 2018-08-04 NOTE — Progress Notes (Signed)
Patient tearful upon entrance into room. States is "very lonely". Offered to call chaplain. Patient refused. Visitor showed up about 30 seconds later. Will obviously not offer to show any EMMI videos at this time. Wenda Low [facility]

## 2018-08-04 NOTE — Progress Notes (Addendum)
[place] [facility] [place] NAME: Jill Castro    MR#:  [id number]  DATE OF BIRTH:  Apr 19, 1927  SUBJECTIVE:  CHIEF COMPLAINT: Patient is still in atrial flutter, heart rate at around 120-130  REVIEW OF SYSTEMS:  CONSTITUTIONAL: No fever, fatigue or weakness.  EYES: No blurred or double vision.  EARS, NOSE, AND THROAT: No tinnitus or ear pain.  RESPIRATORY: No cough, shortness of breath, wheezing or hemoptysis.  CARDIOVASCULAR: No chest pain, orthopnea, edema.  GASTROINTESTINAL: No nausea, vomiting, diarrhea or abdominal pain.  GENITOURINARY: No dysuria, hematuria.  ENDOCRINE: No polyuria, nocturia,  HEMATOLOGY: No anemia, easy bruising or bleeding SKIN: No rash or lesion. MUSCULOSKELETAL: No joint pain or arthritis.   NEUROLOGIC: No tingling, numbness, weakness.  PSYCHIATRY: No anxiety or depression.   DRUG ALLERGIES:   Allergies  Allergen Reactions  . Clonidine   . Benicar [Olmesartan] Other (See Comments)    Lip swelling  . Celebrex [Celecoxib]   . Iodine     Avoids due to decreased kidney function  . Penicillins Rash    Has patient had a PCN reaction causing immediate rash, facial/tongue/throat swelling, SOB or lightheadedness with hypotension: Unknown Has patient had a PCN reaction causing severe rash involving mucus membranes or skin necrosis: Unknown Has patient had a PCN reaction that required hospitalization: Unknown Has patient had a PCN reaction occurring within the last 10 years: Unknown If all of the above answers are "NO", then may proceed with Cephalosporin use.     VITALS:  Blood pressure (!) 112/97, pulse (!) 123, temperature 98.1 F (36.7 C), temperature source Oral, resp. rate 16, height 5\' 4"  (1.626 m), weight 56.2 kg, SpO2 97 %.  PHYSICAL EXAMINATION:  GENERAL:  84 y.o.-year-old patient lying in the bed with no acute distress.  EYES: Pupils equal, round, reactive to light and accommodation. No scleral  icterus. Extraocular muscles intact.  HEENT: Head atraumatic, normocephalic. Oropharynx and nasopharynx clear.  NECK:  Supple, no jugular venous distention. No thyroid enlargement, no tenderness.  LUNGS: Normal breath sounds bilaterally, no wheezing, rales,rhonchi or crepitation. No use of accessory muscles of respiration.  CARDIOVASCULAR: Tachy  No murmurs, rubs, or gallops.  ABDOMEN: Soft, nontender, nondistended. Bowel sounds present. No organomegaly or mass.  EXTREMITIES: No pedal edema, cyanosis, or clubbing.  NEUROLOGIC: Cranial nerves II through XII are intact.  Sensation intact. Gait not checked.  PSYCHIATRIC: The patient is alert and oriented x 3.  SKIN: No obvious rash, lesion, or ulcer.    LABORATORY PANEL:   CBC Recent Labs  Lab 08/03/18 1917  WBC 7.8  HGB 13.6  HCT 40.4  PLT 206   ------------------------------------------------------------------------------------------------------------------  Chemistries  Recent Labs  Lab 08/03/18 1917  NA 137  K 4.2  CL 104  CO2 25  GLUCOSE 115*  BUN 44*  CREATININE 1.81*  CALCIUM 9.5  MG 2.8*   ------------------------------------------------------------------------------------------------------------------  Cardiac Enzymes Recent Labs  Lab 08/03/18 1917  TROPONINI 0.03*   ------------------------------------------------------------------------------------------------------------------  RADIOLOGY:  No results found.  EKG:   Orders placed or performed during the hospital encounter of 08/03/18  . EKG 12-Lead  . EKG 12-Lead  . EKG 12-Lead  . EKG 12-Lead  . EKG 12-Lead  . EKG 12-Lead    ASSESSMENT AND PLAN:    1. atrial flutter new onset -continue cardizem gtt [facility] cardio is following -started patient on eliquis 2.5 mg BID -Troponin negative  2. acute lower extremity edema continue Lasix  Monitor renal function  closely Creatinine 1.6-1.81  3. Macular degeneration continue eyedrops  4.  Hypertension on IV diltiazem drip resume home meds according to blood pressure  5. DVT prophylaxis on eliquis    All the records are reviewed and case discussed with Care Management/Social Workerr. Call placed to patient's son to give an update and awaiting callback   CODE STATUS: Full code  TOTAL TIME TAKING CARE OF THIS PATIENT:36   minutes.   POSSIBLE D/C IN 2  DAYS, DEPENDING ON CLINICAL CONDITION.  Note: This dictation was prepared with Dragon dictation along with smaller phrase technology. Any transcriptional errors that result from this process are unintentional.   Nicholes Mango M.D on 08/04/2018 at 3:25 PM  Between 7am to 6pm - Pager - [phone] After 6pm go to [email or web address] - password [email or web address] [facility]  Office  [phone]  CC: Primary care physician; Einar Pheasant, MD

## 2018-08-04 NOTE — Progress Notes (Addendum)
Pt was complaining of eye pain.Pt request for tylenol and eye drops. Tylenol was given. Notify prime. Will continue to monitor.  Update 0101: Doctor Jannifer Franklin ordered latanaprost (xalatan) 0.005 % ophthalmic solution 1 drop left eye, daily at bedtime. Will continue to monitor.  Update 0133: Mentioned to pt that Doctor ordered eye drops latanoprost for her macular degeneration. Pt states that she wants to call her opthamologist doctor before taking latanoprost because " she has a stranged eyes". Prime made aware. Will continue to monitor.  Update 0139: Doctor Jannifer Franklin ordered to discontinue latanoprost. Will continue to monitor.   Update 0315: Pt HR still 133 and went down to 99 for a seconds.Pt on maximum Cardizem drip at 15mg /hr. Notify prime. Will continue to monitor.  6378: Doctor Jannifer Franklin ordered digoxin 0.125 mg now and daily and wants HR to be <110. Will continue to monitor.  Update 0510: Pt HR still at 104-133's. Page prime. Will continue to monitor.  Update 0545: Doctor Marcille Blanco called and ordered metoprolol tablet 25 mg once. Will continue to monitor.

## 2018-08-04 NOTE — Progress Notes (Signed)
   08/04/18 1100  Clinical Encounter Type  Visited With Patient and family together  Visit Type Initial  Referral From Physician  Consult/Referral To [place] (For Healthcare)  Does Patient Have a Medical Advance Directive? No  Does patient want to make changes to medical advance directive? Yes (Inpatient - patient requests chaplain consult to change a medical advance directive)  Type of Advance Directive [place] in Chart? No - copy requested  Copy of Living Will in Chart? No - copy requested  Would patient like information on creating a medical advance directive? Yes (Inpatient - patient requests chaplain consult to create a medical advance directive)  [place] received an (OR) for AD education/completion. Presented to the room, pastoral presence ensued, alerted patient that I am the [facility] and received an OR for education/completion of (AD). Patient has the understanding that she has a living will and her son is in possession of such. Permission was given by the patient to contact her son Carlis Abbott regarding the living will and to have it faxed to the hospital. Carlis Abbott the (son) was contacted at [phone], he attested to there not being a living will, and requested a (AD) be faxed to him to look over and eventually have it be completed. His fax number is [phone], Mrs. Ryce declined wanting to participate in (AD) education at the time of my pastoral encounter. We were later joined by her pastor and a friend whom was already in the room prior to my arrival. The [facility] will attempt to have the (AD) faxed to the son per his request.

## 2018-08-04 NOTE — Plan of Care (Signed)
  Problem: Education: Goal: Knowledge of General Education information will improve Description Including pain rating scale, medication(s)/side effects and non-pharmacologic comfort measures 08/04/2018 0255 by Liliane Channel, RN Outcome: Progressing 08/04/2018 0254 by Liliane Channel, RN Outcome: Progressing   Problem: Health Behavior/Discharge Planning: Goal: Ability to manage health-related needs will improve 08/04/2018 0255 by Liliane Channel, RN Outcome: Progressing 08/04/2018 0254 by Liliane Channel, RN Outcome: Progressing   Problem: Safety: Goal: Ability to remain free from injury will improve 08/04/2018 0255 by Liliane Channel, RN Outcome: Progressing 08/04/2018 0254 by Liliane Channel, RN Outcome: Progressing   Problem: Clinical Measurements: Goal: Ability to maintain clinical measurements within normal limits will improve Outcome: Not Progressing

## 2018-08-05 ENCOUNTER — Inpatient Hospital Stay (HOSPITAL_COMMUNITY)
Admission: AD | Admit: 2018-08-05 | Discharge: 2018-08-05 | Disposition: A | Discharge status: Home / Self Care | Payer: Medicare Other | Source: Ambulatory Visit | Attending: Cardiology | Admitting: Cardiology

## 2018-08-05 DIAGNOSIS — I4891 Unspecified atrial fibrillation: Secondary | ICD-10-CM

## 2018-08-05 DIAGNOSIS — I340 Nonrheumatic mitral (valve) insufficiency: Secondary | ICD-10-CM

## 2018-08-05 DIAGNOSIS — I361 Nonrheumatic tricuspid (valve) insufficiency: Secondary | ICD-10-CM

## 2018-08-05 LAB — BASIC METABOLIC PANEL
Anion gap: 5 (ref 5–15)
BUN: 33 mg/dL — ABNORMAL HIGH (ref 8–23)
CHLORIDE: 109 mmol/L (ref 98–111)
CO2: 23 mmol/L (ref 22–32)
Calcium: 9.1 mg/dL (ref 8.9–10.3)
Creatinine, Ser: 1.24 mg/dL — ABNORMAL HIGH (ref 0.44–1.00)
GFR calc non Af Amer: 37 mL/min — ABNORMAL LOW (ref >60)
GFR, EST AFRICAN AMERICAN: 43 mL/min — AB (ref >60)
Glucose, Bld: 98 mg/dL (ref 70–99)
POTASSIUM: 4.1 mmol/L (ref 3.5–5.1)
SODIUM: 137 mmol/L (ref 135–145)

## 2018-08-05 LAB — CBC
HEMATOCRIT: 36 % (ref 35.0–47.0)
HEMOGLOBIN: 12.4 g/dL (ref 12.0–16.0)
MCH: 32.2 pg (ref 26.0–34.0)
MCHC: 34.4 g/dL (ref 32.0–36.0)
MCV: 93.8 fL (ref 80.0–100.0)
Platelets: 175 10*3/uL (ref 150–440)
RBC: 3.84 MIL/uL (ref 3.80–5.20)
RDW: 14 % (ref 11.5–14.5)
WBC: 8.1 10*3/uL (ref 3.6–11.0)

## 2018-08-05 LAB — ECHOCARDIOGRAM COMPLETE
HEIGHTINCHES: 64 in
Weight: 1984 oz

## 2018-08-05 NOTE — Plan of Care (Signed)
Patient continues on diltiazem gtt, rate now at 15mg /hr, HR ranging from 100-120's, Titration slow d/t hypotension occurrence early morning, BP remains stable at this time. Will continue to assess and monitor.   Problem: Elimination: Goal: Will not experience complications related to urinary retention Outcome: Progressing   Problem: Pain Managment: Goal: General experience of comfort will improve Outcome: Progressing   Problem: Safety: Goal: Ability to remain free from injury will improve Outcome: Progressing   Problem: Skin Integrity: Goal: Risk for impaired skin integrity will decrease Outcome: Progressing   Problem: Activity: Goal: Ability to tolerate increased activity will improve Outcome: Not Progressing   Problem: Cardiac: Goal: Ability to achieve and maintain adequate cardiopulmonary perfusion will improve Outcome: Not Progressing

## 2018-08-05 NOTE — Progress Notes (Signed)
[place] [facility] [place] NAME: Jill Castro    MR#:  [id number]  DATE OF BIRTH:  1927-10-01  SUBJECTIVE:  CHIEF COMPLAINT: Patient is still in atrial flutter, heart rate at around 120's, had some pauses of about 30 seconds and on Cardizem drip Denies any palpitations.  Patient wants to discuss with her family members regarding cardioversion tomorrow  REVIEW OF SYSTEMS:  CONSTITUTIONAL: No fever, fatigue or weakness.  EYES: No blurred or double vision.  EARS, NOSE, AND THROAT: No tinnitus or ear pain.  RESPIRATORY: No cough, shortness of breath, wheezing or hemoptysis.  CARDIOVASCULAR: No chest pain, orthopnea, edema.  GASTROINTESTINAL: No nausea, vomiting, diarrhea or abdominal pain.  GENITOURINARY: No dysuria, hematuria.  ENDOCRINE: No polyuria, nocturia,  HEMATOLOGY: No anemia, easy bruising or bleeding SKIN: No rash or lesion. MUSCULOSKELETAL: No joint pain or arthritis.   NEUROLOGIC: No tingling, numbness, weakness.  PSYCHIATRY: No anxiety or depression.   DRUG ALLERGIES:   Allergies  Allergen Reactions  . Clonidine   . Benicar [Olmesartan] Other (See Comments)    Lip swelling  . Celebrex [Celecoxib]   . Iodine     Avoids due to decreased kidney function  . Penicillins Rash    Has patient had a PCN reaction causing immediate rash, facial/tongue/throat swelling, SOB or lightheadedness with hypotension: Unknown Has patient had a PCN reaction causing severe rash involving mucus membranes or skin necrosis: Unknown Has patient had a PCN reaction that required hospitalization: Unknown Has patient had a PCN reaction occurring within the last 10 years: Unknown If all of the above answers are "NO", then may proceed with Cephalosporin use.     VITALS:  Blood pressure 103/80, pulse (!) 119, temperature 98 F (36.7 C), temperature source Oral, resp. rate 18, height 5\' 4"  (1.626 m), weight 56.2 kg, SpO2 96 %.  PHYSICAL  EXAMINATION:  GENERAL:  84 y.o.-year-old patient lying in the bed with no acute distress.  EYES: Pupils equal, round, reactive to light and accommodation. No scleral icterus. Extraocular muscles intact.  HEENT: Head atraumatic, normocephalic. Oropharynx and nasopharynx clear.  NECK:  Supple, no jugular venous distention. No thyroid enlargement, no tenderness.  LUNGS: Normal breath sounds bilaterally, no wheezing, rales,rhonchi or crepitation. No use of accessory muscles of respiration.  CARDIOVASCULAR: Tachy, irregular, no murmurs, rubs, or gallops.  ABDOMEN: Soft, nontender, nondistended. Bowel sounds present. No organomegaly or mass.  EXTREMITIES: No pedal edema, cyanosis, or clubbing.  NEUROLOGIC: Cranial nerves II through XII are intact.  Sensation intact. Gait not checked.  PSYCHIATRIC: The patient is alert and oriented x 3.  SKIN: No obvious rash, lesion, or ulcer.    LABORATORY PANEL:   CBC Recent Labs  Lab 08/05/18 0508  WBC 8.1  HGB 12.4  HCT 36.0  PLT 175   ------------------------------------------------------------------------------------------------------------------  Chemistries  Recent Labs  Lab 08/03/18 1917 08/05/18 0508  NA 137 137  K 4.2 4.1  CL 104 109  CO2 25 23  GLUCOSE 115* 98  BUN 44* 33*  CREATININE 1.81* 1.24*  CALCIUM 9.5 9.1  MG 2.8*  --    ------------------------------------------------------------------------------------------------------------------  Cardiac Enzymes Recent Labs  Lab 08/03/18 1917  TROPONINI 0.03*   ------------------------------------------------------------------------------------------------------------------  RADIOLOGY:  No results found.  EKG:   Orders placed or performed during the hospital encounter of 08/03/18  . EKG 12-Lead  . EKG 12-Lead  . EKG 12-Lead  . EKG 12-Lead  . EKG 12-Lead  . EKG 12-Lead    ASSESSMENT  AND PLAN:    1. atrial flutter/atrial fibrillation new onset -continue cardizem  gtt [facility] cardio is following, recommending TEE guided cardioversion in a.m.  Patient wants to discuss with her family members.  Keep her n.p.o. after midnight Dr. Rockey Situ will follow in a.m. -Not considering digoxin considering her advanced age and chronic kidney disease stage III-IV -Toprol-XL 25 mg p.o. once daily -started patient on eliquis 2.5 mg BID -Troponin negative  2. acute lower extremity edema continue Lasix  Monitor renal function closely Creatinine 1.6-1.81-1.24  3. Macular degeneration continue eyedrops  4. Hypertension on IV diltiazem drip resume home meds according to blood pressure  5.   Chronic kidney disease stage III-IV Continue close monitoring seems to be at baseline  DVT prophylaxis on eliquis    All the records are reviewed and case discussed with Care Management/Social Workerr. Call placed to patient's son to give an update and awaiting callback   CODE STATUS: Full code  TOTAL TIME TAKING CARE OF THIS PATIENT:36   minutes.   POSSIBLE D/C IN 2  DAYS, DEPENDING ON CLINICAL CONDITION.  Note: This dictation was prepared with Dragon dictation along with smaller phrase technology. Any transcriptional errors that result from this process are unintentional.   Nicholes Mango M.D on 08/05/2018 at 1:24 PM  Between 7am to 6pm - Pager - [phone] After 6pm go to [email or web address] - password [email or web address]  Office  [phone]  CC: Primary care physician; Einar Pheasant, MD

## 2018-08-05 NOTE — Plan of Care (Signed)
  Problem: Education: Goal: Knowledge of General Education information will improve Description Including pain rating scale, medication(s)/side effects and non-pharmacologic comfort measures Outcome: Progressing   Problem: Health Behavior/Discharge Planning: Goal: Ability to manage health-related needs will improve Outcome: Progressing   Problem: Clinical Measurements: Goal: Ability to maintain clinical measurements within normal limits will improve Outcome: Progressing Goal: Will remain free from infection Outcome: Progressing Goal: Diagnostic test results will improve Outcome: Progressing Goal: Respiratory complications will improve Outcome: Progressing Goal: Cardiovascular complication will be avoided Outcome: Progressing   Problem: Activity: Goal: Risk for activity intolerance will decrease Outcome: Progressing   Problem: Nutrition: Goal: Adequate nutrition will be maintained Outcome: Progressing   Problem: Coping: Goal: Level of anxiety will decrease Outcome: Progressing   Problem: Elimination: Goal: Will not experience complications related to bowel motility Outcome: Progressing Goal: Will not experience complications related to urinary retention Outcome: Progressing   Problem: Pain Managment: Goal: General experience of comfort will improve Outcome: Progressing   Problem: Safety: Goal: Ability to remain free from injury will improve Outcome: Progressing   Problem: Skin Integrity: Goal: Risk for impaired skin integrity will decrease Outcome: Progressing   Problem: Spiritual Needs Goal: Ability to function at adequate level Outcome: Progressing   Problem: Education: Goal: Knowledge of disease or condition will improve Outcome: Progressing Goal: Understanding of medication regimen will improve Outcome: Progressing Goal: Individualized Educational Video(s) Outcome: Progressing   Problem: Activity: Goal: Ability to tolerate increased activity will  improve Outcome: Progressing   Problem: Cardiac: Goal: Ability to achieve and maintain adequate cardiopulmonary perfusion will improve Outcome: Progressing   Problem: Health Behavior/Discharge Planning: Goal: Ability to safely manage health-related needs after discharge will improve Outcome: Progressing

## 2018-08-05 NOTE — Progress Notes (Signed)
HR sustaining in 60's and pt with frequent episodes of bradycardia with HR in 50's. Blood pressure 81/46. Cardizem gtt currently running at 5mg /hr. Per orders, will discontinue cardizem gtt and continue to monitor HR and BP.

## 2018-08-05 NOTE — Progress Notes (Signed)
HR back into the 110's-120's. Blood pressure 132/93. Will restart Cardizem gtt back at 5mg /hr. Will continue to monitor.

## 2018-08-05 NOTE — Progress Notes (Signed)
Cardizem drip continued at 10mg /hr. Pt's HR sustaining between 80's-100's. Blood pressure remains stable. Will continue to monitor and titrate as needed.

## 2018-08-05 NOTE — Progress Notes (Signed)
[name], female   DOB: 07/09/1927, 84 y.o.   MRN: [id number]   Progress Note  Patient Name: Jill Castro Date of Encounter: 08/05/2018  Primary Cardiologist: Dr. Rockey Situ  Subjective   Feeling ok today.  HR still high in 100s-110s, atrial fibrillation.  Had some pauses of about 3 seconds.  Remains on diltiazem at 10 mg/hr.   Inpatient Medications    Scheduled Meds: . apixaban  2.5 mg Oral BID  . dorzolamide-timolol  1 drop Both Eyes BID  . metoprolol succinate  25 mg Oral Daily   Continuous Infusions: . diltiazem (CARDIZEM) infusion 10 mg/hr (08/05/18 0919)   PRN Meds: acetaminophen **OR** acetaminophen, ondansetron **OR** ondansetron (ZOFRAN) IV   Vital Signs    Vitals:   08/05/18 1028 08/05/18 1030 08/05/18 1045 08/05/18 1100  BP: 112/81 108/86 100/70 103/80  Pulse: (!) 124 (!) 122 (!) 119 (!) 119  Resp:      Temp:      TempSrc:      SpO2:      Weight:      Height:        Intake/Output Summary (Last 24 hours) at 08/05/2018 1130 Last data filed at 08/05/2018 0919 Gross per 24 hour  Intake 482.62 ml  Output 1750 ml  Net -1267.38 ml   Filed Weights   08/03/18 1742 08/04/18 0314  Weight: 57.4 kg 56.2 kg    Telemetry    Atrial flutter, rate in 110s - Personally Reviewed  Physical Exam   General: NAD Neck: No JVD, no thyromegaly or thyroid nodule.  Lungs: Clear to auscultation bilaterally with normal respiratory effort. CV: Nondisplaced PMI.  Heart tachy, irregular S1/S2, no S3/S4, no murmur.  No peripheral edema.  No carotid bruit.  Normal pedal pulses.  Abdomen: Soft, nontender, no hepatosplenomegaly, no distention.  Skin: Intact without lesions or rashes.  Neurologic: Alert and oriented x 3.  Psych: Normal affect. Extremities: No clubbing or cyanosis.  HEENT: Normal.    Labs    Chemistry Recent Labs  Lab 08/03/18 1917 08/05/18 0508  NA 137 137  K 4.2 4.1  CL 104 109  CO2 25 23  GLUCOSE 115* 98  BUN 44* 33*  CREATININE  1.81* 1.24*  CALCIUM 9.5 9.1  GFRNONAA 24* 37*  GFRAA 27* 43*  ANIONGAP 8 5     Hematology Recent Labs  Lab 08/03/18 1917 08/05/18 0508  WBC 7.8 8.1  RBC 4.32 3.84  HGB 13.6 12.4  HCT 40.4 36.0  MCV 93.6 93.8  MCH 31.5 32.2  MCHC 33.7 34.4  RDW 14.4 14.0  PLT 206 175    Cardiac Enzymes Recent Labs  Lab 08/03/18 1917  TROPONINI 0.03*   No results for input(s): TROPIPOC in the last 168 hours.   BNPNo results for input(s): BNP, PROBNP in the last 168 hours.   DDimer No results for input(s): DDIMER in the last 168 hours.   Radiology    No results found.   Patient Profile     84 y.o. female with chronic diastolic CHF, HTN, CKD stage 3-4 was admitted with atrial flutter/RVR (new onset).   Assessment & Plan    1. Atrial fibrillation/atypical flutter: HR in 110s today, rate still very high at times.   - Continue apixaban, dosed 2.5 mg bid with age and elevated creatinine.   - Continue diltiazem gtt 10 mg/hr.  - Would avoid digoxin given age, gender, normal EF, and CKD 3-4. Continue Toprol XL 25 mg daily.  -  I suspect she will end up needing cardioversion given difficult to control atrial arrhythmias.  She would have to have a TEE prior.  She is not sure whether she would want a procedure or not.  I will keep her NPO after midnight and she can discuss with Dr. Rockey Situ tomorrow.  - Echo being done today.  2. Chronic diastolic CHF: She is not markedly volume overloaded.  - Echo as above.  3. CKD: Stage 3-4, follow closely.  Creatinine better today.   For questions or updates, please contact [facility] Please consult [email or web address] for contact info under    Signed, Loralie Champagne, MD  08/05/2018, 11:30 AM

## 2018-08-05 NOTE — Progress Notes (Signed)
Cardizem gtt decreased to 7.5mg /hr due to HR sustaining in 60's-80's. Blood pressure taken shortly after 83/50. HR still in 60's-80's. Prime doc notified of BP. Cardizem gtt decreased to 5mg /hr. Per prime doc, monitor BP for now with current decrease in rate. Will continue to monitor closely.

## 2018-08-06 ENCOUNTER — Encounter
Admission: AD | Disposition: A | Discharge status: Home / Self Care | Payer: Self-pay | Source: Ambulatory Visit | Attending: Internal Medicine

## 2018-08-06 ENCOUNTER — Inpatient Hospital Stay: Payer: Medicare Other | Admitting: Certified Registered"

## 2018-08-06 ENCOUNTER — Inpatient Hospital Stay (HOSPITAL_COMMUNITY)
Admission: AD | Admit: 2018-08-06 | Discharge: 2018-08-06 | Disposition: A | Discharge status: Home / Self Care | Payer: Medicare Other | Source: Ambulatory Visit | Attending: Physician Assistant | Admitting: Physician Assistant

## 2018-08-06 ENCOUNTER — Telehealth: Payer: Self-pay | Admitting: Cardiovascular Disease

## 2018-08-06 ENCOUNTER — Encounter: Payer: Self-pay | Admitting: *Deleted

## 2018-08-06 DIAGNOSIS — I340 Nonrheumatic mitral (valve) insufficiency: Secondary | ICD-10-CM

## 2018-08-06 DIAGNOSIS — I4892 Unspecified atrial flutter: Secondary | ICD-10-CM

## 2018-08-06 HISTORY — PX: TEE WITHOUT CARDIOVERSION: SHX5443

## 2018-08-06 HISTORY — PX: CARDIOVERSION: EP1203

## 2018-08-06 LAB — CBC
HEMATOCRIT: 36.1 % (ref 35.0–47.0)
Hemoglobin: 12.4 g/dL (ref 12.0–16.0)
MCH: 32.4 pg (ref 26.0–34.0)
MCHC: 34.4 g/dL (ref 32.0–36.0)
MCV: 94.1 fL (ref 80.0–100.0)
Platelets: 180 10*3/uL (ref 150–440)
RBC: 3.84 MIL/uL (ref 3.80–5.20)
RDW: 14 % (ref 11.5–14.5)
WBC: 7.8 10*3/uL (ref 3.6–11.0)

## 2018-08-06 LAB — BASIC METABOLIC PANEL
Anion gap: 6 (ref 5–15)
BUN: 37 mg/dL — ABNORMAL HIGH (ref 8–23)
CALCIUM: 9.5 mg/dL (ref 8.9–10.3)
CHLORIDE: 108 mmol/L (ref 98–111)
CO2: 23 mmol/L (ref 22–32)
Creatinine, Ser: 1.18 mg/dL — ABNORMAL HIGH (ref 0.44–1.00)
GFR calc Af Amer: 46 mL/min — ABNORMAL LOW (ref >60)
GFR calc non Af Amer: 40 mL/min — ABNORMAL LOW (ref >60)
Glucose, Bld: 104 mg/dL — ABNORMAL HIGH (ref 70–99)
Potassium: 4 mmol/L (ref 3.5–5.1)
SODIUM: 137 mmol/L (ref 135–145)

## 2018-08-06 LAB — TSH: TSH: 3.946 u[IU]/mL (ref 0.350–4.500)

## 2018-08-06 SURGERY — CARDIOVERSION (CATH LAB)
Anesthesia: General

## 2018-08-06 SURGERY — ECHOCARDIOGRAM, TRANSESOPHAGEAL
Anesthesia: General

## 2018-08-06 MED ORDER — FENTANYL CITRATE (PF) 100 MCG/2ML IJ SOLN
INTRAMUSCULAR | Status: DC | PRN
  Administered 2018-08-06 (×2): 25 ug via INTRAVENOUS

## 2018-08-06 MED ORDER — PROPOFOL 10 MG/ML IV BOLUS
INTRAVENOUS | Status: AC
  Filled 2018-08-06: qty 20

## 2018-08-06 MED ORDER — PROPOFOL 10 MG/ML IV BOLUS
INTRAVENOUS | Status: DC | PRN
  Administered 2018-08-06: 10 mg via INTRAVENOUS
  Administered 2018-08-06: 5 mg via INTRAVENOUS
  Administered 2018-08-06 (×2): 10 mg via INTRAVENOUS

## 2018-08-06 MED ORDER — SODIUM CHLORIDE 0.9% FLUSH: 3.0000 mL | INTRAVENOUS | Status: DC | PRN

## 2018-08-06 MED ORDER — BUTAMBEN-TETRACAINE-BENZOCAINE 2-2-14 % EX AERO
INHALATION_SPRAY | CUTANEOUS | Status: AC
  Filled 2018-08-06: qty 5

## 2018-08-06 MED ORDER — LIDOCAINE VISCOUS HCL 2 % MT SOLN
OROMUCOSAL | Status: AC
  Filled 2018-08-06: qty 15

## 2018-08-06 MED ORDER — PHENOL 1.4 % MT LIQD
1.0000 | OROMUCOSAL | Status: DC | PRN
  Filled 2018-08-06: qty 177

## 2018-08-06 MED ORDER — SODIUM CHLORIDE FLUSH 0.9 % IV SOLN
INTRAVENOUS | Status: AC
  Filled 2018-08-06: qty 10

## 2018-08-06 MED ORDER — LIDOCAINE VISCOUS HCL 2 % MT SOLN
OROMUCOSAL | Status: DC | PRN
  Administered 2018-08-06: 15 mL via OROMUCOSAL

## 2018-08-06 MED ORDER — BUTAMBEN-TETRACAINE-BENZOCAINE 2-2-14 % EX AERO
INHALATION_SPRAY | CUTANEOUS | Status: DC | PRN
  Administered 2018-08-06: 3 via TOPICAL

## 2018-08-06 MED ORDER — SODIUM CHLORIDE 0.9 % IV SOLN: 250.0000 mL | INTRAVENOUS | Status: DC

## 2018-08-06 MED ORDER — FENTANYL CITRATE (PF) 100 MCG/2ML IJ SOLN
INTRAMUSCULAR | Status: AC
  Filled 2018-08-06: qty 2

## 2018-08-06 MED ORDER — SODIUM CHLORIDE 0.9% FLUSH
3.0000 mL | Freq: Two times a day (BID) | INTRAVENOUS | Status: DC
  Administered 2018-08-06 – 2018-08-07 (×2): 3 mL via INTRAVENOUS

## 2018-08-06 MED ORDER — SODIUM CHLORIDE 0.9 % IV SOLN
INTRAVENOUS | Status: DC
  Administered 2018-08-06: 17:00:00 via INTRAVENOUS

## 2018-08-06 NOTE — CV Procedure (Signed)
Cardioversion note: A standard informed consent was obtained. Timeout was performed. The pads were placed in the anterior posterior fashion.  TEE was performed which showed no evidence of intracardiac thrombus. The patient was given propofol by the anesthesia team.  Successful cardioversion was performed with a 100 J. The patient converted to sinus rhythm. Pre-and post EKGs were reviewed. The patient tolerated the procedure with no immediate complications.  Recommendations: Continue same medications .  Likely discharge home tomorrow.

## 2018-08-06 NOTE — Progress Notes (Signed)
*  PRELIMINARY RESULTS* Echocardiogram Echocardiogram Transesophageal has been performed.  Wallie Char Gaston Dase 08/06/2018, 5:02 PM

## 2018-08-06 NOTE — Progress Notes (Signed)
[facility] NAME: Jill Castro    MR#:  [id number]  DATE OF BIRTH:  March 12, 1927  SUBJECTIVE:  CHIEF COMPLAINT: Patient is still in atrial flutter, heart rate at around 120's, had some pauses of about 30 seconds and on Cardizem drip Denies any palpitations.  Patient agreeable to cardioversion today, scheduled at 4 PM.  Daughter at bedside  REVIEW OF SYSTEMS:  CONSTITUTIONAL: No fever, fatigue or weakness.  EYES: No blurred or double vision.  EARS, NOSE, AND THROAT: No tinnitus or ear pain.  RESPIRATORY: No cough, shortness of breath, wheezing or hemoptysis.  CARDIOVASCULAR: No chest pain, orthopnea, edema.  GASTROINTESTINAL: No nausea, vomiting, diarrhea or abdominal pain.  GENITOURINARY: No dysuria, hematuria.  ENDOCRINE: No polyuria, nocturia,  HEMATOLOGY: No anemia, easy bruising or bleeding SKIN: No rash or lesion. MUSCULOSKELETAL: No joint pain or arthritis.   NEUROLOGIC: No tingling, numbness, weakness.  PSYCHIATRY: No anxiety or depression.   DRUG ALLERGIES:   Allergies  Allergen Reactions  . Clonidine   . Benicar [Olmesartan] Other (See Comments)    Lip swelling  . Celebrex [Celecoxib]   . Iodine     Avoids due to decreased kidney function  . Penicillins Rash    Has patient had a PCN reaction causing immediate rash, facial/tongue/throat swelling, SOB or lightheadedness with hypotension: Unknown Has patient had a PCN reaction causing severe rash involving mucus membranes or skin necrosis: Unknown Has patient had a PCN reaction that required hospitalization: Unknown Has patient had a PCN reaction occurring within the last 10 years: Unknown If all of the above answers are "NO", then may proceed with Cephalosporin use.     VITALS:  Blood pressure (!) 145/103, pulse (!) 128, temperature 97.9 F (36.6 C), temperature source Oral, resp. rate (!) 21, height 5\' 4"  (1.626 m), weight 55.9 kg, SpO2 96  %.  PHYSICAL EXAMINATION:  GENERAL:  84 y.o.-year-old patient lying in the bed with no acute distress.  EYES: Pupils equal, round, reactive to light and accommodation. No scleral icterus. Extraocular muscles intact.  HEENT: Head atraumatic, normocephalic. Oropharynx and nasopharynx clear.  NECK:  Supple, no jugular venous distention. No thyroid enlargement, no tenderness.  LUNGS: Normal breath sounds bilaterally, no wheezing, rales,rhonchi or crepitation. No use of accessory muscles of respiration.  CARDIOVASCULAR: Tachy, irregular, no murmurs, rubs, or gallops.  ABDOMEN: Soft, nontender, nondistended. Bowel sounds present. No organomegaly or mass.  EXTREMITIES: No pedal edema, cyanosis, or clubbing.  NEUROLOGIC: Cranial nerves II through XII are intact.  Sensation intact. Gait not checked.  PSYCHIATRIC: The patient is alert and oriented x 3.  SKIN: No obvious rash, lesion, or ulcer.    LABORATORY PANEL:   CBC Recent Labs  Lab 08/06/18 0435  WBC 7.8  HGB 12.4  HCT 36.1  PLT 180   ------------------------------------------------------------------------------------------------------------------  Chemistries  Recent Labs  Lab 08/03/18 1917  08/06/18 0435  NA 137   < > 137  K 4.2   < > 4.0  CL 104   < > 108  CO2 25   < > 23  GLUCOSE 115*   < > 104*  BUN 44*   < > 37*  CREATININE 1.81*   < > 1.18*  CALCIUM 9.5   < > 9.5  MG 2.8*  --   --    < > = values in this interval not displayed.   ------------------------------------------------------------------------------------------------------------------  Cardiac Enzymes Recent Labs  Lab 08/03/18 1917  TROPONINI 0.03*   ------------------------------------------------------------------------------------------------------------------  RADIOLOGY:  No results found.  EKG:   Orders placed or performed during the hospital encounter of 08/03/18  . EKG 12-Lead  . EKG 12-Lead  . EKG 12-Lead  . EKG 12-Lead  . EKG 12-Lead   . EKG 12-Lead  . EKG 12-Lead  . EKG 12-Lead  . EKG 12-Lead  . EKG 12-Lead    ASSESSMENT AND PLAN:    1. atrial flutter/atrial fibrillation new onset -continue cardizem gtt [facility] cardio is following, recommending TEE guided cardioversion.  Scheduled at 4 PM today and patient and her daughter are agreeable   -Not considering digoxin considering her advanced age and chronic kidney disease stage III-IV -Toprol-XL 25 mg p.o. once daily -started patient on eliquis 2.5 mg BID -Troponin negative  2. acute lower extremity edema continue Lasix  Monitor renal function closely Creatinine 1.6-1.81-1.24-1.18  3. Macular degeneration continue eyedrops  4. Hypertension on IV diltiazem drip resume home meds according to blood pressure  5.   Chronic kidney disease stage III-IV Continue close monitoring seems to be at baseline  DVT prophylaxis on eliquis    All the records are reviewed and case discussed with Care Management/Social Workerr. Call placed to patient's son to give an update and awaiting callback   CODE STATUS: Full code  TOTAL TIME TAKING CARE OF THIS PATIENT:36   minutes.   POSSIBLE D/C IN 2  DAYS, DEPENDING ON CLINICAL CONDITION.  Note: This dictation was prepared with Dragon dictation along with smaller phrase technology. Any transcriptional errors that result from this process are unintentional.   Nicholes Mango M.D on 08/06/2018 at 4:35 PM  Between 7am to 6pm - Pager - [phone] After 6pm go to [email or web address] - password [email or web address] [facility]  Office  [phone]  CC: Primary care physician; Einar Pheasant, MD

## 2018-08-06 NOTE — Anesthesia Procedure Notes (Signed)
Date/Time: 08/06/2018 4:43 PM Performed by: Doreen Salvage, CRNA Pre-anesthesia Checklist: Patient identified, Emergency Drugs available, Suction available and Patient being monitored Patient Re-evaluated:Patient Re-evaluated prior to induction Oxygen Delivery Method: Nasal cannula Induction Type: IV induction Dental Injury: Teeth and Oropharynx as per pre-operative assessment  Comments: Nasal cannula with etCO2 monitoring

## 2018-08-06 NOTE — Telephone Encounter (Signed)
-----   Message from Valora Corporal, RN sent at 08/03/2018  5:12 PM EDT ----- Patient needs 2 week follow up appointment. Please assist with this.  THanks

## 2018-08-06 NOTE — Anesthesia Preprocedure Evaluation (Signed)
Anesthesia Evaluation  Patient identified by MRN, date of birth, ID band Patient awake    Reviewed: Allergy & Precautions, NPO status , Patient's Chart, lab work & pertinent test results  History of Anesthesia Complications Negative for: history of anesthetic complications  Airway Mallampati: II  TM Distance: >3 FB Neck ROM: full    Dental  (+) Dental Advidsory Given, Caps   Pulmonary           Cardiovascular hypertension, (-) angina+ Peripheral Vascular Disease, +CHF and + DOE  (-) CAD, (-) Past MI, (-) Cardiac Stents and (-) CABG + dysrhythmias (-) Valvular Problems/Murmurs     Neuro/Psych PSYCHIATRIC DISORDERS Depression    GI/Hepatic Neg liver ROS, GERD  ,  Endo/Other  negative endocrine ROS  Renal/GU CRFRenal disease  negative genitourinary   Musculoskeletal   Abdominal   Peds  Hematology negative hematology ROS (+)   Anesthesia Other Findings Past Medical History: No date: Sherran Needs syndrome No date: Diastolic CHF, acute (HCC)     Comment:  Echo (8/10) with EF 60-65%, mild LVH, mild to moderate               TR, PASP 40 mmHg. No date: Dizziness     Comment:  Thought to be side effect of Norvasc No date: GERD (gastroesophageal reflux disease) No date: Glaucoma No date: HTN (hypertension) No date: Hypercalcemia     Comment:  On HCTZ No date: Hypercholesterolemia No date: Hyperkalemia No date: Macular degeneration No date: Osteoporosis No date: Renal fibromuscular dysplasia (HCC) No date: Renal insufficiency 05/2002: S/P cardiac catheterization     Comment:  Luminal irregularities only in the coronaries. EF 55%.               There was some irregulatrity in the right renal artery               suggestive of possible fibromuscular dysplasia. Myoview               in 5/08 showed no ischemia or infarction. Lexiscan               myoview at [facility] (10/10) showed EF 55%, normal wall   motion, no evience ofor ischemia or infarction. No date: Scoliosis associated with other condition     Comment:  Chronic back pain No date: Uterine cancer ([place])     Comment:  S/P hypsterectomy in 2008 No date: Wears hearing aid     Comment:  bilateral   Reproductive/Obstetrics                             Anesthesia Physical  Anesthesia Plan  ASA: III  Anesthesia Plan: General   Post-op Pain Management:    Induction: Intravenous  PONV Risk Score and Plan: 3 and Propofol infusion and TIVA  Airway Management Planned: Nasal Cannula  Additional Equipment:   Intra-op Plan:   Post-operative Plan:   Informed Consent: I have reviewed the patients History and Physical, chart, labs and discussed the procedure including the risks, benefits and alternatives for the proposed anesthesia with the patient or authorized representative who has indicated his/her understanding and acceptance.   Dental Advisory Given  Plan Discussed with: Anesthesiologist, CRNA and Surgeon  Anesthesia Plan Comments:         Anesthesia Quick Evaluation

## 2018-08-06 NOTE — Care Management (Signed)
Provided patient and her daughter with coupon for 30 day trial Eliquis.  Patient verbalizes she does have pharmacy coverage with her medicare plan. Uses [facility].  Successful cardioversion this afternoon

## 2018-08-06 NOTE — Progress Notes (Signed)
ANTICOAGULATION CONSULT NOTE - Initial Consult  Pharmacy Consult for apixaban Indication: Aflutter  Allergies  Allergen Reactions  . Clonidine   . Benicar [Olmesartan] Other (See Comments)    Lip swelling  . Celebrex [Celecoxib]   . Iodine     Avoids due to decreased kidney function  . Penicillins Rash    Has patient had a PCN reaction causing immediate rash, facial/tongue/throat swelling, SOB or lightheadedness with hypotension: Unknown Has patient had a PCN reaction causing severe rash involving mucus membranes or skin necrosis: Unknown Has patient had a PCN reaction that required hospitalization: Unknown Has patient had a PCN reaction occurring within the last 10 years: Unknown If all of the above answers are "NO", then may proceed with Cephalosporin use.     Patient Measurements: Height: 5\' 4"  (162.6 cm) Weight: 123 lb 4.8 oz (55.9 kg) IBW/kg (Calculated) : 54.7 Heparin Dosing Weight:    Vital Signs: Temp: 98.1 F (36.7 C) (09/09 0737) Temp Source: Oral (09/09 0737) BP: 98/52 (09/09 0737) Pulse Rate: 64 (09/09 0737)  Labs: Recent Labs    08/03/18 1917 08/05/18 0508 08/06/18 0435  HGB 13.6 12.4 12.4  HCT 40.4 36.0 36.1  PLT 206 175 180  CREATININE 1.81* 1.24* 1.18*  TROPONINI 0.03*  --   --     Estimated Creatinine Clearance: 27.9 mL/min (A) (by C-G formula based on SCr of 1.18 mg/dL (H)).   Medical History: Past Medical History:  Diagnosis Date  . Sherran Needs syndrome   . Diastolic CHF, acute (HCC)    Echo (8/10) with EF 60-65%, mild LVH, mild to moderate TR, PASP 40 mmHg.  . Dizziness    Thought to be side effect of Norvasc  . GERD (gastroesophageal reflux disease)   . Glaucoma   . HTN (hypertension)   . Hypercalcemia    On HCTZ  . Hypercholesterolemia   . Hyperkalemia   . Macular degeneration   . Osteoporosis   . Renal fibromuscular dysplasia ([place])   . Renal insufficiency   . S/P cardiac catheterization 05/2002   Luminal irregularities  only in the coronaries. EF 55%. There was some irregulatrity in the right renal artery suggestive of possible fibromuscular dysplasia. Myoview in 5/08 showed no ischemia or infarction. Lexiscan myoview at [facility] (10/10) showed EF 55%, normal wall motion, no evience ofor ischemia or infarction.  . Scoliosis associated with other condition    Chronic back pain  . Uterine cancer ([place])    S/P hypsterectomy in 2008  . Wears hearing aid    bilateral    Medications:  Scheduled:  . apixaban  2.5 mg Oral BID  . dorzolamide-timolol  1 drop Both Eyes BID  . metoprolol succinate  25 mg Oral Daily   Infusions:  . diltiazem (CARDIZEM) infusion 10 mg/hr (08/06/18 6629)    Assessment: 84 yo F with new onset Aflutter w/ RVR starting apixaban. No anticoagulants noted PTA. Wt 57.4 kg Scr pending   Goal of Therapy:   Monitor platelets by anticoagulation protocol: Yes   Plan:  Will continue current orders for Apixaban 2.5 mg po bid. Pharmacy to sign off.  Ramond Dial, Pharm.D, BCPS Clinical Pharmacist 08/06/2018,9:54 AM

## 2018-08-06 NOTE — Sedation Documentation (Signed)
Anesthesia and CRNA gave pt. Sedation for TEE and Cardioversion

## 2018-08-06 NOTE — Anesthesia Post-op Follow-up Note (Signed)
Anesthesia QCDR form completed.        

## 2018-08-06 NOTE — Transfer of Care (Signed)
Immediate Anesthesia Transfer of Care Note  Patient: Jill Castro  Procedure(s) Performed: Procedure(s): TRANSESOPHAGEAL ECHOCARDIOGRAM (TEE) (N/A) CARDIOVERSION (N/A)  Patient Location: PACU and Short Stay  Anesthesia Type:General  Level of Consciousness: awake, alert  and oriented  Airway & Oxygen Therapy: Patient Spontanous Breathing and Patient connected to nasal cannula oxygen  Post-op Assessment: Report given to RN and Post -op Vital signs reviewed and stable  Post vital signs: Reviewed and stable  Last Vitals:  Vitals:   08/06/18 1651 08/06/18 1652  BP: 103/66 103/66  Pulse: 63   Resp: 16 16  Temp:    SpO2: 16% 10%    Complications: No apparent anesthesia complications

## 2018-08-06 NOTE — Care Management Important Message (Signed)
Copy of signed IM left with patient in room.  

## 2018-08-06 NOTE — Progress Notes (Signed)
Progress Note  Patient Name: Jill Castro Date of Encounter: 08/06/2018  Primary Cardiologist: Rockey Situ  Subjective   No acute overnight events. Denies any chest pain, dyspnea, palpitations, dizziness, presyncope, or syncope. Remains in atrial flutter with RVR with variable AV block with ventricular rates in the low 100s to 130s bpm. Echo on 9/8 showed an EF of 60-65%, moderate LVH, not technically to allow for LV diastolic function, calcified mitral annulus with mild MR, moderately dilated left atrium measuring 40 mm, mildly dilated RV with normal RVSF, moderately dilated RA, moderate TR, PASP 48 mmHg. 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   Renal function continues to improve 1.81-->1.24-->1.18. Potassium at goal 4.0. CBC unremarkable. Magnesium 2.6 on 9/6.   Inpatient Medications    Scheduled Meds: . apixaban  2.5 mg Oral BID  . dorzolamide-timolol  1 drop Both Eyes BID  . metoprolol succinate  25 mg Oral Daily   Continuous Infusions: . diltiazem (CARDIZEM) infusion 7.5 mg/hr (08/06/18 0813)   PRN Meds: acetaminophen **OR** acetaminophen, ondansetron **OR** ondansetron (ZOFRAN) IV   Vital Signs    Vitals:   08/06/18 0415 08/06/18 0500 08/06/18 0726 08/06/18 0737  BP: 127/70 (!) 135/92  (!) 98/52  Pulse: (!) 122 95 (!) 131 64  Resp:      Temp:   98.1 F (36.7 C) 98.1 F (36.7 C)  TempSrc:   Oral Oral  SpO2:   97% 92%  Weight:  55.9 kg    Height:        Intake/Output Summary (Last 24 hours) at 08/06/2018 0912 Last data filed at 08/06/2018 0816 Gross per 24 hour  Intake 356.18 ml  Output 800 ml  Net -443.82 ml   Filed Weights   08/04/18 0314 08/05/18 1815 08/06/18 0500  Weight: 56.2 kg 56.1 kg 55.9 kg    Telemetry    Atrial flutter with variable AV block with RVR with heart rates into the low 100s to 130s bpm, longest R-R interval 2.7 seconds - Personally Reviewed  ECG    n/a - Personally Reviewed  Physical Exam   GEN: No acute distress.    Neck: No JVD. Cardiac: Tachycardic, irregular, no murmurs, rubs, or gallops.  Respiratory: Clear to auscultation bilaterally.  GI: Soft, nontender, non-distended.   MS: No edema; No deformity. Neuro:  Alert and oriented x 3; Nonfocal.  Psych: Normal affect.  Labs    Chemistry Recent Labs  Lab 08/03/18 1917 08/05/18 0508 08/06/18 0435  NA 137 137 137  K 4.2 4.1 4.0  CL 104 109 108  CO2 25 23 23   GLUCOSE 115* 98 104*  BUN 44* 33* 37*  CREATININE 1.81* 1.24* 1.18*  CALCIUM 9.5 9.1 9.5  GFRNONAA 24* 37* 40*  GFRAA 27* 43* 46*  ANIONGAP 8 5 6      Hematology Recent Labs  Lab 08/03/18 1917 08/05/18 0508 08/06/18 0435  WBC 7.8 8.1 7.8  RBC 4.32 3.84 3.84  HGB 13.6 12.4 12.4  HCT 40.4 36.0 36.1  MCV 93.6 93.8 94.1  MCH 31.5 32.2 32.4  MCHC 33.7 34.4 34.4  RDW 14.4 14.0 14.0  PLT 206 175 180    Cardiac Enzymes Recent Labs  Lab 08/03/18 1917  TROPONINI 0.03*   No results for input(s): TROPIPOC in the last 168 hours.   BNPNo results for input(s): BNP, PROBNP in the last 168 hours.   DDimer No results for input(s): DDIMER in the last 168 hours.   Radiology    No results found.  Cardiac Studies   2-D Echo 08/05/2018: Study Conclusions  - Left ventricle: The cavity size was normal. Wall thickness was   increased in a pattern of moderate LVH. Systolic function was   normal. The estimated ejection fraction was in the range of 60%   to 65%. The study was not technically sufficient to allow   evaluation of LV diastolic dysfunction due to atrial   fibrillation. - Aortic valve: There was no stenosis. - Mitral valve: Mildly calcified annulus. There was mild   regurgitation. - Left atrium: The atrium was moderately dilated. - Right ventricle: The cavity size was mildly dilated. Systolic   function was normal. - Right atrium: The atrium was moderately dilated. - Tricuspid valve: There was moderate regurgitation. - Pulmonary arteries: PA peak pressure: 48 mm  Hg (S). - Systemic veins: IVC meeasured 1.7 cm with < 50% respirophasic   variation, suggesting RA pressure 8 mmHg.  Impressions:  - The patient was in atrial fibrillation with RVR. Normal LV size   with moderate LV hypertrophy. EF 60-65%. Mildly dilated RV with   normal systolic function. Mild MR. Moderate TR. Mild to moderate   pulmonary hypertension. Biatrial enlargement. This echo could be   consistent with cardiac amyloidosis. __________  Patient Profile     84 y.o. female with history of nonobstructive CAD by [place] in 2003, recently diagnosed atrial flutter on Eliquis, chronic diastolic CHF/pulmonary hypertension, chronic venous insufficiency, CKD stage III who was directly admitted from the office with new onset atrial flutter with RVR on 08/03/2018.   Assessment & Plan    1. New onset atrial flutter with RVR with variable AV block: -Ventricular rates remain difficult to control -Remains on diltiazem gtt for rate control  -BP soft, metoprolol held this morning -Avoid digoxin usage given her advanced age with underlying CKD and normal EF -NPO -TEE/DCCV today ~ 4 PM -Long discussion with patient and her daughter this morning regarding the risks and benefits of procedure  -Continue Eliquis 2.5 mg bid (meets reduced dosing criteria with age > 49 and weight < 60 kg) -Check TSH -Potassium and magnesium at goal -Consider outpatient EP evaluation for possible ablation   2. Acute on chronic diastolic CHF: -She does not appear grossly volume up -Echo as above  3. CKD stage III: -Improving  For questions or updates, please contact [facility] Please consult [email or web address] for contact info under Cardiology/STEMI.    Signed, Christell Faith, PA-C [facility] Pager: [phone] 08/06/2018, 9:12 AM

## 2018-08-06 NOTE — Telephone Encounter (Signed)
Lmov for patient to call and schedule appointment ° °

## 2018-08-06 NOTE — Care Management (Signed)
From home. Admitted with atrial fib with RVR requiring cardiazem drip.  Placed on new Eliquis. Provided 30 day trial coupon. For cardioversion today

## 2018-08-07 ENCOUNTER — Encounter: Payer: Self-pay | Admitting: Cardiovascular Disease

## 2018-08-07 ENCOUNTER — Telehealth: Payer: Self-pay | Admitting: *Deleted

## 2018-08-07 DIAGNOSIS — I4892 Unspecified atrial flutter: Principal | ICD-10-CM

## 2018-08-07 LAB — CBC
HCT: 36.9 % (ref 35.0–47.0)
Hemoglobin: 12.6 g/dL (ref 12.0–16.0)
MCH: 32.1 pg (ref 26.0–34.0)
MCHC: 34.3 g/dL (ref 32.0–36.0)
MCV: 93.7 fL (ref 80.0–100.0)
PLATELETS: 194 10*3/uL (ref 150–440)
RBC: 3.94 MIL/uL (ref 3.80–5.20)
RDW: 14.4 % (ref 11.5–14.5)
WBC: 7.6 10*3/uL (ref 3.6–11.0)

## 2018-08-07 LAB — BASIC METABOLIC PANEL
ANION GAP: 7 (ref 5–15)
BUN: 38 mg/dL — ABNORMAL HIGH (ref 8–23)
CO2: 21 mmol/L — ABNORMAL LOW (ref 22–32)
Calcium: 9.6 mg/dL (ref 8.9–10.3)
Chloride: 108 mmol/L (ref 98–111)
Creatinine, Ser: 1.35 mg/dL — ABNORMAL HIGH (ref 0.44–1.00)
GFR calc Af Amer: 39 mL/min — ABNORMAL LOW (ref >60)
GFR, EST NON AFRICAN AMERICAN: 34 mL/min — AB (ref >60)
Glucose, Bld: 100 mg/dL — ABNORMAL HIGH (ref 70–99)
POTASSIUM: 4.2 mmol/L (ref 3.5–5.1)
SODIUM: 136 mmol/L (ref 135–145)

## 2018-08-07 MED ORDER — METOPROLOL SUCCINATE ER 50 MG PO TB24
50.0000 mg | ORAL_TABLET | Freq: Every day | ORAL | Status: DC
  Administered 2018-08-07: 50 mg via ORAL
  Filled 2018-08-07: qty 1

## 2018-08-07 MED ORDER — APIXABAN 2.5 MG PO TABS: 2.5000 mg | ORAL_TABLET | Freq: Two times a day (BID) | ORAL | 0 refills | Status: DC

## 2018-08-07 MED ORDER — METOPROLOL SUCCINATE ER 50 MG PO TB24: 50.0000 mg | ORAL_TABLET | Freq: Every day | ORAL | 0 refills | Status: DC

## 2018-08-07 NOTE — Telephone Encounter (Signed)
First attempt made for TCM no answer left message for patient to call office.

## 2018-08-07 NOTE — Progress Notes (Signed)
Progress Note  Patient Name: Jill Castro Date of Encounter: 08/07/2018  Primary Cardiologist: Rockey Situ  Subjective   Status post successful TEE/DCCV on 08/06/18. Remains in sinus rhythm. No overnight events. Renal function 1.81-->1.24-->1.18-->1.35.   Inpatient Medications    Scheduled Meds: . apixaban  2.5 mg Oral BID  . dorzolamide-timolol  1 drop Both Eyes BID  . metoprolol succinate  50 mg Oral Daily  . sodium chloride flush  3 mL Intravenous Q12H   Continuous Infusions: . sodium chloride     PRN Meds: acetaminophen **OR** acetaminophen, butamben-tetracaine-benzocaine, lidocaine, ondansetron **OR** ondansetron (ZOFRAN) IV, phenol, sodium chloride flush   Vital Signs    Vitals:   08/06/18 1738 08/06/18 1941 08/07/18 0307 08/07/18 0708  BP: 121/73 105/60 (!) 134/91 (!) 148/94  Pulse: 75 83 95 90  Resp: 18 18 18 20   Temp: 97.9 F (36.6 C) 98 F (36.7 C) 98.1 F (36.7 C) 98.2 F (36.8 C)  TempSrc: Oral   Oral  SpO2: 96% 95% 96% 98%  Weight:   55 kg   Height:        Intake/Output Summary (Last 24 hours) at 08/07/2018 0838 Last data filed at 08/07/2018 0700 Gross per 24 hour  Intake 100 ml  Output 550 ml  Net -450 ml   Filed Weights   08/05/18 1815 08/06/18 0500 08/07/18 0307  Weight: 56.1 kg 55.9 kg 55 kg    Telemetry    NSR, 70s to 90s bpm - Personally Reviewed  ECG    post-DCCV: NSR, PACs - Personally Reviewed  Physical Exam   GEN: No acute distress.   Neck: No JVD. Cardiac: RRR, no murmurs, rubs, or gallops.  Respiratory: Clear to auscultation bilaterally.  GI: Soft, nontender, non-distended.   MS: No edema; No deformity. Neuro:  Alert and oriented x 3; Nonfocal.  Psych: Normal affect.  Labs    Chemistry Recent Labs  Lab 08/05/18 0508 08/06/18 0435 08/07/18 0326  NA 137 137 136  K 4.1 4.0 4.2  CL 109 108 108  CO2 23 23 21*  GLUCOSE 98 104* 100*  BUN 33* 37* 38*  CREATININE 1.24* 1.18* 1.35*  CALCIUM 9.1 9.5 9.6  GFRNONAA  37* 40* 34*  GFRAA 43* 46* 39*  ANIONGAP 5 6 7      Hematology Recent Labs  Lab 08/05/18 0508 08/06/18 0435 08/07/18 0326  WBC 8.1 7.8 7.6  RBC 3.84 3.84 3.94  HGB 12.4 12.4 12.6  HCT 36.0 36.1 36.9  MCV 93.8 94.1 93.7  MCH 32.2 32.4 32.1  MCHC 34.4 34.4 34.3  RDW 14.0 14.0 14.4  PLT 175 180 194    Cardiac Enzymes Recent Labs  Lab 08/03/18 1917  TROPONINI 0.03*   No results for input(s): TROPIPOC in the last 168 hours.   BNPNo results for input(s): BNP, PROBNP in the last 168 hours.   DDimer No results for input(s): DDIMER in the last 168 hours.   Radiology    No results found.  Cardiac Studies   2-D echo 08/05/2018: Study Conclusions  - Left ventricle: The cavity size was normal. Wall thickness was increased in a pattern of moderate LVH. Systolic function was normal. The estimated ejection fraction was in the range of 60%   to 65%. The study was not technically sufficient to allow evaluation of LV diastolic dysfunction due to atrial fibrillation. - Aortic valve: There was no stenosis. - Mitral valve: Mildly calcified annulus. There was mild   regurgitation. - Left atrium: The  atrium was moderately dilated. - Right ventricle: The cavity size was mildly dilated. Systolic function was normal. - Right atrium: The atrium was moderately dilated. - Tricuspid valve: There was moderate regurgitation. - Pulmonary arteries: PA peak pressure: 48 mm Hg (S). - Systemic veins: IVC meeasured 1.7 cm with < 50% respirophasic variation, suggesting RA pressure 8 mmHg.  Impressions:  - The patient was in atrial fibrillation with RVR. Normal LV size with moderate LV hypertrophy. EF 60-65%. Mildly dilated RV with normal systolic function. Mild MR. Moderate TR. Mild to moderate pulmonary hypertension. Biatrial enlargement. This echo could be consistent with cardiac amyloidosis. __________   TEE/DCCV 08/06/18: Study Conclusions  - Left ventricle: Hypertrophy was noted. Systolic  function was normal. The estimated ejection fraction was in the range of 55% to 60%. No evidence of thrombus. - Aortic valve: No evidence of vegetation. - Mitral valve: No evidence of vegetation. There was mild regurgitation. - Left atrium: The atrium was dilated. No evidence of thrombus in the atrial cavity or appendage. - Right atrium: The atrium was dilated. No evidence of thrombus in the atrial cavity or appendage. - Atrial septum: No defect or patent foramen ovale was identified. - Tricuspid valve: No evidence of vegetation. - Pulmonic valve: No evidence of vegetation.  Impressions:  - Successful cardioversion. No cardiac source of emboli was indentified. __________  Patient Profile     84 y.o. female with history of nonobstructive CAD by [place] in 2003, recently diagnosed atrial flutter on Eliquis, chronic diastolic CHF/pulmonary hypertension, chronic venous insufficiency, CKD stage III who was directly admitted from the office with new onset atrial flutter with RVR on 08/03/2018.   Assessment & Plan    1. New onset atrial flutter with RVR with variable AV block: -Status post successful TEE/DCCV on 08/06/18 -Continue Eliquis 2.5 mg bid given CHADS2VASc at least 4 (HTN, age x 2, female) -Increase Toprol XL to 50 mg daily for rate control -Potassium and magnesium at goal -TSH normal -Outpatient follow up  2. Acute on chronic diastolic CHF: -She does not appear volume up -Continue to hold Lasix given bump in renal function -Echo as above  3. Acute on CKD stage III: -Hold Lasix -Recommend follow up BMET either with [place] or PCP by 9/13 if she is discharged today. IM, please arrange for this   For questions or updates, please contact [place] Please consult [email or web address] for contact info under Cardiology/STEMI.    Signed, Christell Faith, PA-C [place] Pager: [phone] 08/07/2018, 8:38 AM

## 2018-08-07 NOTE — Discharge Summary (Signed)
Jill Castro at Jill Castro NAME: Jill Castro    MR#:  [id number]  DATE OF BIRTH:  Mar 13, 1927  DATE OF ADMISSION:  08/03/2018 ADMITTING PHYSICIAN: Jill Mandes, MD  DATE OF DISCHARGE: 08/07/18  PRIMARY CARE PHYSICIAN: Jill Pheasant, MD    ADMISSION DIAGNOSIS:  Atrial Flutter  DISCHARGE DIAGNOSIS:   New onset atrial flutter/atrial fibrillation status post TEE guided cardioversion SECONDARY DIAGNOSIS:   Past Medical History:  Diagnosis Date  . Sherran Needs syndrome   . Diastolic CHF, acute (HCC)    Echo (8/10) with EF 60-65%, mild LVH, mild to moderate TR, PASP 40 mmHg.  . Dizziness    Thought to be side effect of Norvasc  . GERD (gastroesophageal reflux disease)   . Glaucoma   . HTN (hypertension)   . Hypercalcemia    On HCTZ  . Hypercholesterolemia   . Hyperkalemia   . Macular degeneration   . Osteoporosis   . Renal fibromuscular dysplasia ([place])   . Renal insufficiency   . S/P cardiac catheterization 05/2002   Luminal irregularities only in the coronaries. EF 55%. There was some irregulatrity in the right renal artery suggestive of possible fibromuscular dysplasia. Myoview in 5/08 showed no ischemia or infarction. Lexiscan myoview at [facility] (10/10) showed EF 55%, normal wall motion, no evience ofor ischemia or infarction.  . Scoliosis associated with other condition    Chronic back pain  . Uterine cancer ([place])    S/P hypsterectomy in 2008  . Wears hearing aid    bilateral    HOSPITAL COURSE:   HPI  Jill Castro  is a 84 y.o. female with a known history of chronic diastolic congestive heart failure, hypertension, hyperlipidemia and chronic lower extremity edema comes from Jill Castro office when she was found to have heart rate of hundred 30 with atrial flutter on EKG which is new onset. Patient also complained of shortness of breath. Blood pressure is stable. She is being admitted for further evaluation and  management  1.atrial flutter/atrial fibrillation new onset -Patient did not respond well to Cardizem Status post successful TEE/DCCV on 08/06/18 -Not considering digoxin considering her advanced age and chronic kidney disease stage III-IV -Toprol-XL 50 mg p.o. once daily -started patient on eliquis 2.5 mg BID -Troponin negative -Okay to discharge patient from cardiology standpoint  2.acute lower extremity edema continue Lasix  Monitor renal function closely Creatinine 1.6-1.81-1.24-1.18-1.35 PCP to repeat BMP on September 13 during follow-up visit  3.Macular degeneration continue eyedrops  4.Hypertension Toprol-XL 50 mg Discontinued hydralazine, Cozaar  resume home meds according to blood pressure  5.    Acute on chronic kidney disease stage III-IV Continue close monitoring , creatinine at 1.35 Holding Lasix PCP to consider repeating BMP during the follow-up visit on 08/10/2018  DVT prophylaxis on eliquis, case manager provided coupons  Disposition [facility] independent living facility, and has walker and cane.  Daughter will be assisting her and refused home health PT, RN  DISCHARGE CONDITIONS:  stable  CONSULTS OBTAINED:  Treatment Team:  Jill Merritts, MD   PROCEDURES TEE/DCCV on 08/06/18  DRUG ALLERGIES:   Allergies  Allergen Reactions  . Clonidine   . Benicar [Olmesartan] Other (See Comments)    Lip swelling  . Celebrex [Celecoxib]   . Iodine     Avoids due to decreased kidney function  . Penicillins Rash    Has patient had a PCN reaction causing immediate rash, facial/tongue/throat swelling, SOB or lightheadedness with hypotension: Unknown  Has patient had a PCN reaction causing severe rash involving mucus membranes or skin necrosis: Unknown Has patient had a PCN reaction that required hospitalization: Unknown Has patient had a PCN reaction occurring within the last 10 years: Unknown If all of the above answers are "NO", then may proceed with  Cephalosporin use.     DISCHARGE MEDICATIONS:   Allergies as of 08/07/2018      Reactions   Clonidine    Benicar [olmesartan] Other (See Comments)   Lip swelling   Celebrex [celecoxib]    Iodine    Avoids due to decreased kidney function   Penicillins Rash   Has patient had a PCN reaction causing immediate rash, facial/tongue/throat swelling, SOB or lightheadedness with hypotension: Unknown Has patient had a PCN reaction causing severe rash involving mucus membranes or skin necrosis: Unknown Has patient had a PCN reaction that required hospitalization: Unknown Has patient had a PCN reaction occurring within the last 10 years: Unknown If all of the above answers are "NO", then may proceed with Cephalosporin use.      Medication List    STOP taking these medications   aspirin 81 MG EC tablet   furosemide 40 MG tablet Commonly known as:  LASIX   hydrALAZINE 100 MG tablet Commonly known as:  APRESOLINE   losartan 100 MG tablet Commonly known as:  COZAAR     TAKE these medications   apixaban 2.5 MG Tabs tablet Commonly known as:  ELIQUIS Take 1 tablet (2.5 mg total) by mouth 2 (two) times daily.   Calcium-Vitamin D 600-200 MG-UNIT Caps Take 0.5 mg by mouth daily. Takes 1/2 tablet daily.   Glucosamine-Chondroitin 1500-1200 MG/30ML Liqd Take 0.5 tablets by mouth daily. Takes 1/2 tablet daily.   latanoprost 0.005 % ophthalmic solution Commonly known as:  XALATAN Place 1 drop into the left eye at bedtime.   Magnesium 250 MG Tabs Take 250 mg by mouth daily.   metoprolol succinate 50 MG 24 hr tablet Commonly known as:  TOPROL-XL Take 1 tablet (50 mg total) by mouth daily. Take with or immediately following a meal. Start taking on:  08/08/2018   MIRALAX PO Take by mouth daily.   multivitamin per tablet Take 1 tablet by mouth daily.   nystatin cream Commonly known as:  MYCOSTATIN Apply 1 application topically 2 (two) times daily.   PA B-COMPLEX WITH B-12  Tabs Take 1 tablet by mouth daily.   ranitidine 150 MG tablet Commonly known as:  ZANTAC Take 150 mg by mouth 2 (two) times daily.   simvastatin 10 MG tablet Commonly known as:  ZOCOR Take 1 tablet (10 mg total) by mouth at bedtime.   STOOL SOFTENER 100 MG capsule Generic drug:  docusate sodium Take 100 mg by mouth daily as needed for mild constipation.   TYLENOL PO Take 1 tablet by mouth as needed (PAIN).        DISCHARGE INSTRUCTIONS:   Follow-up with primary care physician on September 13, PCP to consider repeating BMP Follow-up with cardiology Dr. Fletcher Anon in a week  DIET:  Cardiac diet  DISCHARGE CONDITION:  Stable  ACTIVITY:  Activity as tolerated  OXYGEN:  Home Oxygen: No.   Oxygen Delivery: room air  DISCHARGE LOCATION:  home   If you experience worsening of your admission symptoms, develop shortness of breath, life threatening emergency, suicidal or homicidal thoughts you must seek medical attention immediately by calling 911 or calling your MD immediately  if symptoms less severe.  You Must  read complete instructions/literature along with all the possible adverse reactions/side effects for all the Medicines you take and that have been prescribed to you. Take any new Medicines after you have completely understood and accpet all the possible adverse reactions/side effects.   Please note  You were cared for by a hospitalist during your hospital stay. If you have any questions about your discharge medications or the care you received while you were in the hospital after you are discharged, you can call the unit and asked to speak with the hospitalist on call if the hospitalist that took care of you is not available. Once you are discharged, your primary care physician will handle any further medical issues. Please note that NO REFILLS for any discharge medications will be authorized once you are discharged, as it is imperative that you return to your primary care  physician (or establish a relationship with a primary care physician if you do not have one) for your aftercare needs so that they can reassess your need for medications and monitor your lab values.     Today  No chief complaint on file.  Patient is doing much better.  Denies any palpitations or chest pain or shortness of breath.  Daughter at bedside.  Wants to go home refusing home health Daughter wants to stay with her mom for the next few days to assist her  ROS:  CONSTITUTIONAL: Denies fevers, chills. Denies any fatigue, weakness.  EYES: Denies blurry vision, double vision, eye pain. EARS, NOSE, THROAT: Denies tinnitus, ear pain, hearing loss. RESPIRATORY: Denies cough, wheeze, shortness of breath.  CARDIOVASCULAR: Denies chest pain, palpitations, edema.  GASTROINTESTINAL: Denies nausea, vomiting, diarrhea, abdominal pain. Denies bright red blood per rectum. GENITOURINARY: Denies dysuria, hematuria. ENDOCRINE: Denies nocturia or thyroid problems. HEMATOLOGIC AND LYMPHATIC: Denies easy bruising or bleeding. SKIN: Denies rash or lesion. MUSCULOSKELETAL: Denies pain in neck, back, shoulder, knees, hips or arthritic symptoms.  NEUROLOGIC: Denies paralysis, paresthesias.  PSYCHIATRIC: Denies anxiety or depressive symptoms.   VITAL SIGNS:  Blood pressure (!) 148/94, pulse 90, temperature 98.2 F (36.8 C), temperature source Oral, resp. rate 20, height 5\' 4"  (1.626 m), weight 55 kg, SpO2 98 %.  I/O:    Intake/Output Summary (Last 24 hours) at 08/07/2018 1200 Last data filed at 08/07/2018 1017 Gross per 24 hour  Intake 580 ml  Output 400 ml  Net 180 ml    PHYSICAL EXAMINATION:  GENERAL:  84 y.o.-year-old patient lying in the bed with no acute distress.  EYES: Pupils equal, round, reactive to light and accommodation. No scleral icterus. Extraocular muscles intact.  HEENT: Head atraumatic, normocephalic. Oropharynx and nasopharynx clear.  NECK:  Supple, no jugular venous  distention. No thyroid enlargement, no tenderness.  LUNGS: Normal breath sounds bilaterally, no wheezing, rales,rhonchi or crepitation. No use of accessory muscles of respiration.  CARDIOVASCULAR: S1, S2 normal. No murmurs, rubs, or gallops.  ABDOMEN: Soft, non-tender, non-distended. Bowel sounds present. No organomegaly or mass.  EXTREMITIES: No pedal edema, cyanosis, or clubbing.  NEUROLOGIC: Cranial nerves II through XII are intact. Muscle strength 5/5 in all extremities. Sensation intact. Gait not checked.  PSYCHIATRIC: The patient is alert and oriented x 3.  SKIN: No obvious rash, lesion, or ulcer.   DATA REVIEW:   CBC Recent Labs  Lab 08/07/18 0326  WBC 7.6  HGB 12.6  HCT 36.9  PLT 194    Chemistries  Recent Labs  Lab 08/03/18 1917  08/07/18 0326  NA 137   < > 136  K 4.2   < > 4.2  CL 104   < > 108  CO2 25   < > 21*  GLUCOSE 115*   < > 100*  BUN 44*   < > 38*  CREATININE 1.81*   < > 1.35*  CALCIUM 9.5   < > 9.6  MG 2.8*  --   --    < > = values in this interval not displayed.    Cardiac Enzymes Recent Labs  Lab 08/03/18 1917  TROPONINI 0.03*    Microbiology Results  Results for orders placed or performed in visit on 07/20/16  CULTURE, URINE COMPREHENSIVE     Status: None   Collection Time: 07/20/16 12:27 PM  Result Value Ref Range Status   Culture ENTEROCOCCUS SPECIES LACTOBACILLUS SPECIES   Final   Colony Count 1,000-10,000 CFU/mL  Final   Organism ID, Bacteria ENTEROCOCCUS SPECIES  Final    Comment: May represent colonizers from external and internal genitalia.No further testing(including susceptibility)will be performed.    Colony Count 10,000-50,000 CFU/mL  Final   Organism ID, Bacteria LACTOBACILLUS SPECIES  Final      Susceptibility   Enterococcus species -  (no method available)    AMPICILLIN <=2 Sensitive     LEVOFLOXACIN 1 Sensitive     NITROFURANTOIN <=16 Sensitive     VANCOMYCIN 1 Sensitive     TETRACYCLINE >=16 Resistant      RADIOLOGY:  No results found.  EKG:   Orders placed or performed during the hospital encounter of 08/03/18  . EKG 12-Lead  . EKG 12-Lead  . EKG 12-Lead  . EKG 12-Lead  . EKG 12-Lead  . EKG 12-Lead  . EKG 12-Lead  . EKG 12-Lead  . EKG 12-Lead  . EKG 12-Lead  . EKG 12-Lead  . EKG 12-Lead  . EKG 12-Lead  . EKG 12-Lead      Management plans discussed with the patient, family and they are in agreement.  CODE STATUS:     Code Status Orders  (From admission, onward)         Start     Ordered   08/03/18 1852  Full code  Continuous     08/03/18 1851        Code Status History    Date Active Date Inactive Code Status Order ID Comments User Context   08/12/2017 1536 08/13/2017 1657 Full Code [id number]  Vaughan Basta, MD Inpatient    Advance Directive Documentation     Most Recent Value  Type of Advance Directive  Healthcare Power of Attorney  Pre-existing out of facility DNR order (yellow form or pink MOST form)  -  "MOST" Form in Place?  -      TOTAL TIME TAKING CARE OF THIS PATIENT: 45  minutes.   Note: This dictation was prepared with Dragon dictation along with smaller phrase technology. Any transcriptional errors that result from this process are unintentional.   @[facility] @  on 08/07/2018 at 12:00 PM  Between 7am to 6pm - Pager - [phone]  After 6pm go to [email or web address] - password [email or web address] [facility]  Office  [phone]  CC: Primary care physician; Jill Pheasant, MD

## 2018-08-07 NOTE — Plan of Care (Signed)
  Problem: Education: Goal: Knowledge of disease or condition will improve Outcome: Progressing Goal: Understanding of medication regimen will improve Outcome: Progressing   Problem: Activity: Goal: Ability to tolerate increased activity will improve Outcome: Progressing   Problem: Clinical Measurements: Goal: Ability to maintain clinical measurements within normal limits will improve Outcome: Completed/Met Goal: Cardiovascular complication will be avoided Outcome: Completed/Met

## 2018-08-07 NOTE — Anesthesia Postprocedure Evaluation (Signed)
Anesthesia Post Note  Patient: Jill Castro  Procedure(s) Performed: TRANSESOPHAGEAL ECHOCARDIOGRAM (TEE) (N/A ) CARDIOVERSION (N/A )  Patient location during evaluation: Cath Lab Anesthesia Type: General Level of consciousness: awake and alert Pain management: pain level controlled Vital Signs Assessment: post-procedure vital signs reviewed and stable Respiratory status: spontaneous breathing, nonlabored ventilation, respiratory function stable and patient connected to nasal cannula oxygen Cardiovascular status: blood pressure returned to baseline and stable Postop Assessment: no apparent nausea or vomiting Anesthetic complications: no     Last Vitals:  Vitals:   08/07/18 0307 08/07/18 0708  BP: (!) 134/91 (!) 148/94  Pulse: 95 90  Resp: 18 20  Temp: 36.7 C 36.8 C  SpO2: 96% 98%    Last Pain:  Vitals:   08/07/18 0708  TempSrc: Oral  PainSc: 0-No pain                 Martha Clan

## 2018-08-07 NOTE — Care Management (Signed)
Confirmed had Eliquis coupon in hand.  Patient to discharge home at [facility]. Daughter declines any need for home health follow up.

## 2018-08-07 NOTE — Discharge Instructions (Signed)
Follow-up with primary care physician on September 13, PCP to consider repeating BMP Follow-up with cardiology Dr. Fletcher Anon in a week

## 2018-08-07 NOTE — Telephone Encounter (Signed)
Patient scheduled 08/29/18  Nothing else needed.

## 2018-08-07 NOTE — Telephone Encounter (Signed)
Copied from [place] [phone]. Topic: Appointment Scheduling - Scheduling Inquiry for Clinic >> Aug 07, 2018 12:29 PM Synthia Innocent wrote: Reason for CRM: Hospital calling needing hospital follow up on 08/10/18, hospital requesting BMP. Dr Nicki Reaper able to work in?

## 2018-08-09 NOTE — Telephone Encounter (Signed)
Transition Care Management Follow-up Telephone Call  How have you been since you were released from the hospital? Was able to reach patient staying with daughter , she say she is doing great since being home.   Do you understand why you were in the hospital? yes   Do you understand the discharge instrcutions? yes  Items Reviewed:  Medications reviewed: yes  Allergies reviewed: yes  Dietary changes reviewed: yes  Referrals reviewed: yes   Functional Questionnaire:   Activities of Daily Living (ADLs):   She states they are independent in the following: ambulation, bathing and hygiene, feeding, continence, grooming, toileting and dressing States they require assistance with the following: No assistance required.   Any transportation issues/concerns?: no   Any patient concerns? no   Confirmed importance and date/time of follow-up visits scheduled: yes   Confirmed with patient if condition begins to worsen call PCP or go to the ER.  Patient was given the Call-a-Nurse line [phone]: yes

## 2018-08-09 NOTE — Telephone Encounter (Signed)
Second message left forTCM call no answer,ask patient to please call office.

## 2018-08-10 ENCOUNTER — Other Ambulatory Visit: Payer: Self-pay | Admitting: Internal Medicine

## 2018-08-10 ENCOUNTER — Ambulatory Visit (INDEPENDENT_AMBULATORY_CARE_PROVIDER_SITE_OTHER): Payer: Medicare Other | Admitting: Internal Medicine

## 2018-08-10 VITALS — BP 128/70 | HR 60 | Temp 97.7°F | Resp 18 | Wt 127.0 lb

## 2018-08-10 DIAGNOSIS — M545 Low back pain, unspecified: Secondary | ICD-10-CM

## 2018-08-10 DIAGNOSIS — I5032 Chronic diastolic (congestive) heart failure: Secondary | ICD-10-CM | POA: Diagnosis not present

## 2018-08-10 DIAGNOSIS — E7800 Pure hypercholesterolemia, unspecified: Secondary | ICD-10-CM

## 2018-08-10 DIAGNOSIS — K2100 Gastro-esophageal reflux disease with esophagitis, without bleeding: Secondary | ICD-10-CM

## 2018-08-10 DIAGNOSIS — I4892 Unspecified atrial flutter: Secondary | ICD-10-CM

## 2018-08-10 DIAGNOSIS — E875 Hyperkalemia: Secondary | ICD-10-CM

## 2018-08-10 DIAGNOSIS — R441 Visual hallucinations: Secondary | ICD-10-CM

## 2018-08-10 DIAGNOSIS — N183 Chronic kidney disease, stage 3 unspecified: Secondary | ICD-10-CM

## 2018-08-10 DIAGNOSIS — G8929 Other chronic pain: Secondary | ICD-10-CM

## 2018-08-10 DIAGNOSIS — I10 Essential (primary) hypertension: Secondary | ICD-10-CM

## 2018-08-10 LAB — COMPREHENSIVE METABOLIC PANEL
ALK PHOS: 61 U/L (ref 39–117)
ALT: 15 U/L (ref 0–35)
AST: 19 U/L (ref 0–37)
Albumin: 4.2 g/dL (ref 3.5–5.2)
BILIRUBIN TOTAL: 0.6 mg/dL (ref 0.2–1.2)
BUN: 55 mg/dL — ABNORMAL HIGH (ref 6–23)
CALCIUM: 10.4 mg/dL (ref 8.4–10.5)
CO2: 22 mEq/L (ref 19–32)
CREATININE: 1.43 mg/dL — AB (ref 0.40–1.20)
Chloride: 105 mEq/L (ref 96–112)
GFR: 36.68 mL/min — ABNORMAL LOW (ref >60.00)
GLUCOSE: 102 mg/dL — AB (ref 70–99)
Potassium: 5.2 mEq/L — ABNORMAL HIGH (ref 3.5–5.1)
Sodium: 135 mEq/L (ref 135–145)
Total Protein: 7.4 g/dL (ref 6.0–8.3)

## 2018-08-10 NOTE — Progress Notes (Signed)
[name], female   DOB: Mar 02, 1927, 84 y.o.   MRN: [id number]   Subjective:    [name], female    DOB: 11-Jan-1927, 84 y.o.   MRN: [id number]  HPI  Patient here for hospital follow up.  She was admitted 08/03/18 with new onset atrial flutter/afib.  She is s/p successful TEE/DCCV on 08/06/18.  On toprol and eliquis.  She is off hydralazine and cozaar.  She reports she is feeling better.  Eating.  No chest pain.  Breathing stable.  No increased heart rate or palpitations.  No acid reflux.  On abdominal pain.  Bowels moving.  At [facility].  Still with visual hallucinations.  Has seen neurology previously.  Discussed further w/up and evaluation today.  She declines.  Overall she feels things are stable.  Blood pressure doing well on current regimen (per her report).     Past Medical History:  Diagnosis Date  . Sherran Needs syndrome   . Diastolic CHF, acute (HCC)    Echo (8/10) with EF 60-65%, mild LVH, mild to moderate TR, PASP 40 mmHg.  . Dizziness    Thought to be side effect of Norvasc  . GERD (gastroesophageal reflux disease)   . Glaucoma   . HTN (hypertension)   . Hypercalcemia    On HCTZ  . Hypercholesterolemia   . Hyperkalemia   . Macular degeneration   . Osteoporosis   . Renal fibromuscular dysplasia ([place])   . Renal insufficiency   . S/P cardiac catheterization 05/2002   Luminal irregularities only in the coronaries. EF 55%. There was some irregulatrity in the right renal artery suggestive of possible fibromuscular dysplasia. Myoview in 5/08 showed no ischemia or infarction. Lexiscan myoview at [facility] (10/10) showed EF 55%, normal wall motion, no evience ofor ischemia or infarction.  . Scoliosis associated with other condition    Chronic back pain  . Uterine cancer ([place])    S/P hypsterectomy in 2008  . Wears hearing aid    bilateral   Past Surgical History:  Procedure Laterality Date  . AQUEOUS SHUNT Left 01/30/2017   Procedure: AQUEOUS  SHUNT ahmed tube;  Surgeon: Ronnell Freshwater, MD;  Location: [place];  Service: Ophthalmology;  Laterality: Left;  ahmed tube shunt with scleral patch graft  . CARDIAC CATHETERIZATION     Left  . CARDIOVERSION N/A 08/06/2018   Procedure: CARDIOVERSION;  Surgeon: Wellington Hampshire, MD;  Location: [facility];  Service: Cardiovascular;  Laterality: N/A;  . CATARACT EXTRACTION Right 07/03/2013   [facility] Dr Wallace Going  . CATARACT EXTRACTION W/PHACO Left 01/30/2017   Procedure: CATARACT EXTRACTION PHACO AND INTRAOCULAR LENS PLACEMENT ([place])  left;  Surgeon: Ronnell Freshwater, MD;  Location: [place];  Service: Ophthalmology;  Laterality: Left;  . REFRACTIVE SURGERY    . TEE WITHOUT CARDIOVERSION N/A 08/06/2018   Procedure: TRANSESOPHAGEAL ECHOCARDIOGRAM (TEE);  Surgeon: Wellington Hampshire, MD;  Location: [facility];  Service: Cardiovascular;  Laterality: N/A;  . TOTAL ABDOMINAL HYSTERECTOMY W/ BILATERAL SALPINGOOPHORECTOMY     stage I C well differentiated adenocarcinoma   Family History  Problem Relation Age of Onset  . Heart disease Father   . Heart disease Mother   . Parkinson's disease Mother   . Diabetes Cousin    Social History   Socioeconomic History  . Marital status: Widowed    Spouse name: Not on file  . Number of children: 2  . Years of education: Not on file  .  Highest education level: Not on file  Occupational History  . Not on file  Social Needs  . Financial resource strain: Not hard at all  . Food insecurity:    Worry: Never true    Inability: Never true  . Transportation needs:    Medical: No    Non-medical: No  Tobacco Use  . Smoking status: Never Smoker  . Smokeless tobacco: Never Used  Substance and Sexual Activity  . Alcohol use: No    Alcohol/week: 0.0 standard drinks  . Drug use: No  . Sexual activity: Not on file  Lifestyle  . Physical activity:    Days per week: Not on file    Minutes per session: Not on file  . Stress: Not  on file  Relationships  . Social connections:    Talks on phone: Not on file    Gets together: Not on file    Attends religious service: Not on file    Active member of club or organization: Not on file    Attends meetings of clubs or organizations: Not on file    Relationship status: Not on file  Other Topics Concern  . Not on file  Social History Narrative  . Not on file    Outpatient Encounter Medications as of 08/10/2018  Medication Sig  . Acetaminophen (TYLENOL PO) Take 1 tablet by mouth as needed (PAIN).   Marland Kitchen apixaban (ELIQUIS) 2.5 MG TABS tablet Take 1 tablet (2.5 mg total) by mouth 2 (two) times daily.  . B Complex Vitamins (PA B-COMPLEX WITH B-12) TABS Take 1 tablet by mouth daily.    . Calcium Carbonate-Vitamin D (CALCIUM-VITAMIN D) 600-200 MG-UNIT CAPS Take 0.5 mg by mouth daily. Takes 1/2 tablet daily.   Marland Kitchen docusate sodium (STOOL SOFTENER) 100 MG capsule Take 100 mg by mouth daily as needed for mild constipation.  . Glucosamine-Chondroitin 1500-1200 MG/30ML LIQD Take 0.5 tablets by mouth daily. Takes 1/2 tablet daily.  . Magnesium 250 MG TABS Take 250 mg by mouth daily.  . metoprolol succinate (TOPROL-XL) 50 MG 24 hr tablet Take 1 tablet (50 mg total) by mouth daily. Take with or immediately following a meal.  . multivitamin (THERAGRAN) per tablet Take 1 tablet by mouth daily.    Marland Kitchen nystatin cream (MYCOSTATIN) Apply 1 application topically 2 (two) times daily.  . Polyethylene Glycol 3350 (MIRALAX PO) Take by mouth daily.   . ranitidine (ZANTAC) 150 MG tablet Take 150 mg by mouth 2 (two) times daily.   . simvastatin (ZOCOR) 10 MG tablet Take 1 tablet (10 mg total) by mouth at bedtime.  . [DISCONTINUED] latanoprost (XALATAN) 0.005 % ophthalmic solution Place 1 drop into the left eye at bedtime.    No facility-administered encounter medications on file as of 08/10/2018.     Review of Systems  Constitutional: Negative for appetite change and unexpected weight change.  HENT:  Negative for congestion and sinus pressure.   Respiratory: Negative for cough, chest tightness and shortness of breath.   Cardiovascular: Negative for chest pain, palpitations and leg swelling.       Some chronic lower extremity swelling.  No increase.    Gastrointestinal: Negative for abdominal pain, diarrhea, nausea and vomiting.  Genitourinary: Negative for difficulty urinating and dysuria.  Musculoskeletal: Negative for joint swelling and myalgias.  Skin: Negative for color change and rash.  Neurological: Negative for dizziness, light-headedness and headaches.  Psychiatric/Behavioral: Negative for agitation and dysphoric mood.       Visual hallucinations as outlined.  Objective:     Blood pressure rechecked by me:  144/78  Physical Exam  Constitutional: She appears well-developed and well-nourished. No distress.  HENT:  Nose: Nose normal.  Mouth/Throat: Oropharynx is clear and moist.  Neck: Neck supple. No thyromegaly present.  Cardiovascular: Normal rate and regular rhythm.  Pulmonary/Chest: Breath sounds normal. No respiratory distress. She has no wheezes.  Abdominal: Soft. Bowel sounds are normal. There is no tenderness.  Musculoskeletal: She exhibits no tenderness.  Some pedal and ankle edema.  No increased calf swelling or erythema.    Lymphadenopathy:    She has no cervical adenopathy.  Skin: No rash noted. No erythema.  Psychiatric: She has a normal mood and affect. Her behavior is normal.    BP 128/70 (BP Location: Left Arm, Patient Position: Sitting, Cuff Size: Normal)   Pulse 60   Temp 97.7 F (36.5 C) (Oral)   Resp 18   Wt 127 lb (57.6 kg)   SpO2 99%   BMI 21.80 kg/m  Wt Readings from Last 3 Encounters:  08/10/18 127 lb (57.6 kg)  08/07/18 121 lb 4.8 oz (55 kg)  08/03/18 127 lb 12 oz (57.9 kg)     Lab Results  Component Value Date   WBC 7.6 08/07/2018   HGB 12.6 08/07/2018   HCT 36.9 08/07/2018   PLT 194 08/07/2018   GLUCOSE 102 (H)  08/10/2018   CHOL 169 08/13/2017   TRIG 81 08/13/2017   HDL 81 08/13/2017   LDLCALC 72 08/13/2017   ALT 15 08/10/2018   AST 19 08/10/2018   NA 135 08/10/2018   K 5.2 (H) 08/10/2018   CL 105 08/10/2018   CREATININE 1.43 (H) 08/10/2018   BUN 55 (H) 08/10/2018   CO2 22 08/10/2018   TSH 3.946 08/06/2018   HGBA1C 5.6 08/12/2017       Assessment & Plan:   Problem List Items Addressed This Visit    Atrial flutter ([place])    S/p successful TEE/DCCV 08/06/18.  Doing well.  On eliquis.  Taking toprol.  Continue f/u with cardiology.        Chronic back pain    Followed by Dr Sharlet Salina.        Chronic diastolic CHF (congestive heart failure) (HCC) - Primary    Overall stable.  Breathing stable.  Stable pedal and ankle edema.  Compression hose.  Continue current medications.  Continue f/u with cardiology.        Relevant Orders   Comprehensive metabolic panel (Completed)   Chronic kidney disease (CKD), stage III (moderate) ([place])    Followed by nephrology.  Slight decrease in hospital.  Recheck metabolic panel today.        GERD (gastroesophageal reflux disease)    Stable on current regimen.        Hypercholesterolemia    On simvastatin.  Follow lipid panel and liver function tests.        Hypertension    Blood pressure doing well on current regimen.  Follow pressures.  Follow metabolic panel.        Visual hallucinations    Has seen neurology.  Discussed with her today. She declines further evaluation.            Einar Pheasant, MD

## 2018-08-10 NOTE — Progress Notes (Signed)
Order placed for f/u potassium.  

## 2018-08-13 ENCOUNTER — Other Ambulatory Visit: Payer: Self-pay | Admitting: Internal Medicine

## 2018-08-13 ENCOUNTER — Encounter: Payer: Self-pay | Admitting: Internal Medicine

## 2018-08-13 DIAGNOSIS — N183 Chronic kidney disease, stage 3 unspecified: Secondary | ICD-10-CM

## 2018-08-13 DIAGNOSIS — E875 Hyperkalemia: Secondary | ICD-10-CM

## 2018-08-13 NOTE — Progress Notes (Signed)
Order placed for f/u met b.  

## 2018-08-13 NOTE — Assessment & Plan Note (Signed)
Overall stable.  Breathing stable.  Stable pedal and ankle edema.  Compression hose.  Continue current medications.  Continue f/u with cardiology.

## 2018-08-13 NOTE — Assessment & Plan Note (Signed)
Followed by Dr Chasnis.   

## 2018-08-13 NOTE — Assessment & Plan Note (Signed)
S/p successful TEE/DCCV 08/06/18.  Doing well.  On eliquis.  Taking toprol.  Continue f/u with cardiology.

## 2018-08-13 NOTE — Assessment & Plan Note (Signed)
Blood pressure doing well on current regimen.  Follow pressures.  Follow metabolic panel.   

## 2018-08-13 NOTE — Assessment & Plan Note (Signed)
Has seen neurology.  Discussed with her today. She declines further evaluation.

## 2018-08-13 NOTE — Assessment & Plan Note (Signed)
Followed by nephrology.  Slight decrease in hospital.  Recheck metabolic panel today.

## 2018-08-13 NOTE — Assessment & Plan Note (Signed)
Stable on current regimen   

## 2018-08-13 NOTE — Assessment & Plan Note (Signed)
On simvastatin.  Follow lipid panel and liver function tests.   

## 2018-08-14 ENCOUNTER — Other Ambulatory Visit (INDEPENDENT_AMBULATORY_CARE_PROVIDER_SITE_OTHER): Payer: Medicare Other

## 2018-08-14 DIAGNOSIS — E875 Hyperkalemia: Secondary | ICD-10-CM | POA: Diagnosis not present

## 2018-08-14 DIAGNOSIS — N183 Chronic kidney disease, stage 3 unspecified: Secondary | ICD-10-CM

## 2018-08-14 DIAGNOSIS — Z23 Encounter for immunization: Secondary | ICD-10-CM | POA: Diagnosis not present

## 2018-08-14 LAB — BASIC METABOLIC PANEL
BUN: 35 mg/dL — ABNORMAL HIGH (ref 6–23)
CHLORIDE: 105 meq/L (ref 96–112)
CO2: 22 meq/L (ref 19–32)
CREATININE: 1.5 mg/dL — AB (ref 0.40–1.20)
Calcium: 10 mg/dL (ref 8.4–10.5)
GFR: 34.71 mL/min — ABNORMAL LOW (ref >60.00)
GLUCOSE: 131 mg/dL — AB (ref 70–99)
Potassium: 4.6 mEq/L (ref 3.5–5.1)
SODIUM: 136 meq/L (ref 135–145)

## 2018-08-20 ENCOUNTER — Telehealth: Payer: Self-pay | Admitting: Cardiovascular Disease

## 2018-08-20 NOTE — Telephone Encounter (Signed)
No answer. Left message to call back.   

## 2018-08-20 NOTE — Telephone Encounter (Signed)
Called patient back. She had high BP readings this morning. She says sometimes when she wakes up early that happens. She has back discomfort r/t "bad back" and history of scoliosis. Denies chest pain, shortness of breath or dizziness. Last BP at 1:45 pm today after lunch was 128/80, HR 89. Advised patient to monitor BP/HR over the next several days and call us back if BP >140/90 and HR >100.  She verbalized understanding and is aware of upcoming appointment next week.

## 2018-08-20 NOTE — Telephone Encounter (Signed)
Pt is returning your call

## 2018-08-20 NOTE — Telephone Encounter (Signed)
Patient c/o Palpitations:  High priority if patient c/o lightheadedness, shortness of breath, or chest pain  1) How long have you had palpitations/irregular HR/ Afib? Are you having the symptoms now?  Since recent TEE . No   2) Are you currently experiencing lightheadedness, SOB or CP? No   3) Do you have a history of afib (atrial fibrillation) or irregular heart rhythm? No   4) Have you checked your BP or HR? (document readings if available):  Today 5:10 am 141/90  HR 67 8:00 am 140/106   HR 109  5) Are you experiencing any other symptoms? No   Patient concerned about HR trending high over 100

## 2018-08-23 ENCOUNTER — Other Ambulatory Visit: Payer: Self-pay

## 2018-08-23 ENCOUNTER — Emergency Department: Payer: Medicare Other

## 2018-08-23 ENCOUNTER — Inpatient Hospital Stay
Admission: EM | Admit: 2018-08-23 | Discharge: 2018-08-24 | DRG: 309 | Disposition: A | Discharge status: Home Health | Payer: Medicare Other | Attending: Internal Medicine | Admitting: Internal Medicine

## 2018-08-23 DIAGNOSIS — N183 Chronic kidney disease, stage 3 (moderate): Secondary | ICD-10-CM | POA: Diagnosis present

## 2018-08-23 DIAGNOSIS — H409 Unspecified glaucoma: Secondary | ICD-10-CM | POA: Diagnosis present

## 2018-08-23 DIAGNOSIS — R0602 Shortness of breath: Secondary | ICD-10-CM

## 2018-08-23 DIAGNOSIS — I483 Typical atrial flutter: Secondary | ICD-10-CM

## 2018-08-23 DIAGNOSIS — F419 Anxiety disorder, unspecified: Secondary | ICD-10-CM

## 2018-08-23 DIAGNOSIS — I4891 Unspecified atrial fibrillation: Secondary | ICD-10-CM | POA: Diagnosis present

## 2018-08-23 DIAGNOSIS — Z79899 Other long term (current) drug therapy: Secondary | ICD-10-CM

## 2018-08-23 DIAGNOSIS — R441 Visual hallucinations: Secondary | ICD-10-CM | POA: Diagnosis present

## 2018-08-23 DIAGNOSIS — Z8542 Personal history of malignant neoplasm of other parts of uterus: Secondary | ICD-10-CM | POA: Diagnosis not present

## 2018-08-23 DIAGNOSIS — Z7901 Long term (current) use of anticoagulants: Secondary | ICD-10-CM | POA: Diagnosis not present

## 2018-08-23 DIAGNOSIS — M810 Age-related osteoporosis without current pathological fracture: Secondary | ICD-10-CM | POA: Diagnosis present

## 2018-08-23 DIAGNOSIS — Z888 Allergy status to other drugs, medicaments and biological substances status: Secondary | ICD-10-CM | POA: Diagnosis not present

## 2018-08-23 DIAGNOSIS — I480 Paroxysmal atrial fibrillation: Principal | ICD-10-CM | POA: Diagnosis present

## 2018-08-23 DIAGNOSIS — E7800 Pure hypercholesterolemia, unspecified: Secondary | ICD-10-CM | POA: Diagnosis present

## 2018-08-23 DIAGNOSIS — I4892 Unspecified atrial flutter: Secondary | ICD-10-CM | POA: Diagnosis present

## 2018-08-23 DIAGNOSIS — Z974 Presence of external hearing-aid: Secondary | ICD-10-CM

## 2018-08-23 DIAGNOSIS — R54 Age-related physical debility: Secondary | ICD-10-CM | POA: Diagnosis not present

## 2018-08-23 DIAGNOSIS — G8929 Other chronic pain: Secondary | ICD-10-CM | POA: Diagnosis present

## 2018-08-23 DIAGNOSIS — I130 Hypertensive heart and chronic kidney disease with heart failure and stage 1 through stage 4 chronic kidney disease, or unspecified chronic kidney disease: Secondary | ICD-10-CM | POA: Diagnosis present

## 2018-08-23 DIAGNOSIS — H3530 Unspecified macular degeneration: Secondary | ICD-10-CM | POA: Diagnosis present

## 2018-08-23 DIAGNOSIS — E785 Hyperlipidemia, unspecified: Secondary | ICD-10-CM | POA: Diagnosis present

## 2018-08-23 DIAGNOSIS — Z8249 Family history of ischemic heart disease and other diseases of the circulatory system: Secondary | ICD-10-CM

## 2018-08-23 DIAGNOSIS — Z880 Allergy status to penicillin: Secondary | ICD-10-CM

## 2018-08-23 DIAGNOSIS — M549 Dorsalgia, unspecified: Secondary | ICD-10-CM | POA: Diagnosis present

## 2018-08-23 DIAGNOSIS — M419 Scoliosis, unspecified: Secondary | ICD-10-CM | POA: Diagnosis present

## 2018-08-23 DIAGNOSIS — I5032 Chronic diastolic (congestive) heart failure: Secondary | ICD-10-CM | POA: Diagnosis present

## 2018-08-23 DIAGNOSIS — E43 Unspecified severe protein-calorie malnutrition: Secondary | ICD-10-CM | POA: Diagnosis not present

## 2018-08-23 LAB — COMPREHENSIVE METABOLIC PANEL
ALT: 154 U/L — AB (ref 0–44)
AST: 167 U/L — AB (ref 15–41)
Albumin: 3.7 g/dL (ref 3.5–5.0)
Alkaline Phosphatase: 79 U/L (ref 38–126)
Anion gap: 9 (ref 5–15)
BILIRUBIN TOTAL: 0.7 mg/dL (ref 0.3–1.2)
BUN: 36 mg/dL — AB (ref 8–23)
CALCIUM: 9.3 mg/dL (ref 8.9–10.3)
CO2: 22 mmol/L (ref 22–32)
CREATININE: 1.54 mg/dL — AB (ref 0.44–1.00)
Chloride: 106 mmol/L (ref 98–111)
GFR calc Af Amer: 33 mL/min — ABNORMAL LOW (ref >60)
GFR, EST NON AFRICAN AMERICAN: 29 mL/min — AB (ref >60)
Glucose, Bld: 118 mg/dL — ABNORMAL HIGH (ref 70–99)
Potassium: 4.3 mmol/L (ref 3.5–5.1)
Sodium: 137 mmol/L (ref 135–145)
TOTAL PROTEIN: 6.9 g/dL (ref 6.5–8.1)

## 2018-08-23 LAB — CBC WITH DIFFERENTIAL/PLATELET
BASOS PCT: 1 %
Basophils Absolute: 0 10*3/uL (ref 0–0.1)
EOS ABS: 0.2 10*3/uL (ref 0–0.7)
EOS PCT: 3 %
HCT: 36 % (ref 35.0–47.0)
HEMOGLOBIN: 12.1 g/dL (ref 12.0–16.0)
LYMPHS ABS: 1.7 10*3/uL (ref 1.0–3.6)
Lymphocytes Relative: 22 %
MCH: 31.8 pg (ref 26.0–34.0)
MCHC: 33.7 g/dL (ref 32.0–36.0)
MCV: 94.4 fL (ref 80.0–100.0)
MONO ABS: 0.8 10*3/uL (ref 0.2–0.9)
MONOS PCT: 10 %
NEUTROS PCT: 64 %
Neutro Abs: 5.1 10*3/uL (ref 1.4–6.5)
Platelets: 178 10*3/uL (ref 150–440)
RBC: 3.81 MIL/uL (ref 3.80–5.20)
RDW: 14.1 % (ref 11.5–14.5)
WBC: 7.9 10*3/uL (ref 3.6–11.0)

## 2018-08-23 LAB — PROTIME-INR
INR: 1.15
Prothrombin Time: 14.6 seconds (ref 11.4–15.2)

## 2018-08-23 LAB — TROPONIN I
TROPONIN I: 0.03 ng/mL — AB (ref <0.03)
Troponin I: 0.03 ng/mL (ref <0.03)
Troponin I: 0.03 ng/mL (ref <0.03)

## 2018-08-23 LAB — LIPASE, BLOOD: LIPASE: 63 U/L — AB (ref 11–51)

## 2018-08-23 LAB — BRAIN NATRIURETIC PEPTIDE: B Natriuretic Peptide: 712 pg/mL — ABNORMAL HIGH (ref 0.0–100.0)

## 2018-08-23 LAB — MAGNESIUM: Magnesium: 2.7 mg/dL — ABNORMAL HIGH (ref 1.7–2.4)

## 2018-08-23 LAB — APTT: aPTT: 37 seconds — ABNORMAL HIGH (ref 24–36)

## 2018-08-23 MED ORDER — FAMOTIDINE 20 MG PO TABS
20.0000 mg | ORAL_TABLET | Freq: Every day | ORAL | Status: DC
  Administered 2018-08-24: 20 mg via ORAL
  Filled 2018-08-23: qty 1

## 2018-08-23 MED ORDER — METOPROLOL SUCCINATE ER 50 MG PO TB24: 50.0000 mg | ORAL_TABLET | Freq: Every day | ORAL | Status: DC

## 2018-08-23 MED ORDER — APIXABAN 2.5 MG PO TABS: 2.5000 mg | ORAL_TABLET | Freq: Two times a day (BID) | ORAL | Status: DC

## 2018-08-23 MED ORDER — AMIODARONE IV BOLUS ONLY 150 MG/100ML
150.0000 mg | Freq: Once | INTRAVENOUS | Status: AC
  Administered 2018-08-23: 150 mg via INTRAVENOUS
  Filled 2018-08-23: qty 100

## 2018-08-23 MED ORDER — AMIODARONE LOAD VIA INFUSION
150.0000 mg | Freq: Once | INTRAVENOUS | Status: AC
  Administered 2018-08-23: 150 mg via INTRAVENOUS
  Filled 2018-08-23: qty 83.34

## 2018-08-23 MED ORDER — AMIODARONE HCL 200 MG PO TABS: 400.0000 mg | ORAL_TABLET | Freq: Two times a day (BID) | ORAL | Status: DC

## 2018-08-23 MED ORDER — SIMVASTATIN 20 MG PO TABS
10.0000 mg | ORAL_TABLET | Freq: Every day | ORAL | Status: DC
  Administered 2018-08-23: 10 mg via ORAL
  Filled 2018-08-23: qty 1

## 2018-08-23 MED ORDER — SODIUM CHLORIDE 0.9% FLUSH: 3.0000 mL | INTRAVENOUS | Status: DC | PRN

## 2018-08-23 MED ORDER — MAGNESIUM OXIDE 400 (241.3 MG) MG PO TABS
400.0000 mg | ORAL_TABLET | Freq: Every day | ORAL | Status: DC
  Administered 2018-08-24: 400 mg via ORAL
  Filled 2018-08-23: qty 1

## 2018-08-23 MED ORDER — APIXABAN 2.5 MG PO TABS
2.5000 mg | ORAL_TABLET | Freq: Two times a day (BID) | ORAL | Status: DC
  Administered 2018-08-23 – 2018-08-24 (×2): 2.5 mg via ORAL
  Filled 2018-08-23 (×2): qty 1

## 2018-08-23 MED ORDER — METOPROLOL TARTRATE 5 MG/5ML IV SOLN
2.5000 mg | Freq: Once | INTRAVENOUS | Status: AC
  Administered 2018-08-23: 2.5 mg via INTRAVENOUS
  Filled 2018-08-23: qty 5

## 2018-08-23 MED ORDER — SODIUM CHLORIDE 0.9 % IV SOLN: 250.0000 mL | INTRAVENOUS | Status: DC | PRN

## 2018-08-23 MED ORDER — AMIODARONE HCL IN DEXTROSE 360-4.14 MG/200ML-% IV SOLN
30.0000 mg/h | INTRAVENOUS | Status: DC
  Administered 2018-08-23 – 2018-08-24 (×2): 30 mg/h via INTRAVENOUS
  Filled 2018-08-23: qty 200

## 2018-08-23 MED ORDER — SODIUM CHLORIDE 0.9% FLUSH
3.0000 mL | Freq: Two times a day (BID) | INTRAVENOUS | Status: DC
  Administered 2018-08-23: 3 mL via INTRAVENOUS

## 2018-08-23 MED ORDER — METOPROLOL SUCCINATE ER 50 MG PO TB24
50.0000 mg | ORAL_TABLET | Freq: Every day | ORAL | Status: DC
  Administered 2018-08-24: 50 mg via ORAL
  Filled 2018-08-23: qty 1

## 2018-08-23 MED ORDER — ACETAMINOPHEN 325 MG PO TABS
650.0000 mg | ORAL_TABLET | ORAL | Status: DC | PRN
  Administered 2018-08-24: 650 mg via ORAL
  Filled 2018-08-23: qty 2

## 2018-08-23 MED ORDER — AMIODARONE HCL IN DEXTROSE 360-4.14 MG/200ML-% IV SOLN
60.0000 mg/h | INTRAVENOUS | Status: AC
  Administered 2018-08-23 (×2): 60 mg/h via INTRAVENOUS
  Filled 2018-08-23 (×2): qty 200

## 2018-08-23 MED ORDER — ONDANSETRON HCL 4 MG/2ML IJ SOLN: 4.0000 mg | Freq: Four times a day (QID) | INTRAMUSCULAR | Status: DC | PRN

## 2018-08-23 NOTE — ED Triage Notes (Signed)
Pt arrived from [facility] facility with complaints of "not feeling good since 3 am" and "feels like she cant breath". Pt is alert and oriented x 4. VS per EMS BP-144/110 HR-142 O2sat-96%RA BS-102. EMS placed 20 in left hand. Pt received about 245ml NaCl prior to hospital.

## 2018-08-23 NOTE — ED Notes (Signed)
Amio complete. Pt resting comfortably. BP 123/90. HR 105.

## 2018-08-23 NOTE — Progress Notes (Signed)
Patient admitted to unit. Oriented to room, call bell, and staff. Bed in lowest position. Fall safety plan reviewed. Full assessment to Epic. Skin assessment verified with Danae Chen RN. Telemetry box verification with tele clerk and Gerald Stabs NT- Box#: [id number]. Will continue to monitor. Patient refuses bed alarm. Educated on safety.   Per Dr. Rockey Situ, patient to receive another amio bolus followed by continuous infusion until cardioversion tomorrow morning.

## 2018-08-23 NOTE — Consult Note (Signed)
Cardiology Consultation:   [name] MRN: [id number]; DOB: 11-10-1927  Admit date: 08/23/2018 Date of Consult: 08/23/2018  Primary Care Provider: Einar Pheasant, MD Primary Cardiologist: Rockey Situ Physician requesting consult: Dr. Estanislado Pandy Reason for consult: Atrial fibrillation/ flutter with RVR    Patient Profile:   Jill Castro is a 84 y.o. female with a hx of chronic diastolic CHF, history of atrial fibrillation/flutter with recent transesophageal echo and cardioversion while inpatient 08/06/2018, presenting with recurrent atrial fibrillation/flutter rate 130 -140 bpm shortness of breath, anxiety  History of Present Illness:    Jill Castro is an 84 yo pleasant woman with history of  HTN  diastolic CHF,  chronic lower extremity edema (left leg in particular), severe scoliosis and chronic back pain and requires pain medication.  Husband passed frompancreatic cancer Recent cardioversion for atrial fibrillation/flutter Recurrent arrhythmia, presenting to the emergency room  She is unclear when atrial fibrillation/flutter started Friend with her reports having variable heart rate sometimes higher than 100 even at rest over the past several days.  Seem to get worse overnight woke up could not get back to sleep felt palpitations short of breath Worse this morning and presented to the emergency room  Attempt to control rate with beta-blocker unsuccessful and she was started on amiodarone given bolus 150 mg x 1 Rate continue to run 100 up to 140 bpm She was offered cardioversion, became tearful, " not ready" Patient feels weak, very anxious, requesting medication options for rate control  Lives alone,  husband  passed away last year Lower extremity edema has been stable  Chronic hallucinations still an issue, at night when she wakes up Macular degeneration in the right eye, occlusion on the left Has had injections  [place]oved into [place], Food is  poor  [employer] Son lives in [place] in [place]  Chronic back pain Last hospitalization spent 4 days in bed which she reports hurt her back even more  Other past medical history Chronic back pain, not a candidate for back surgery Continued eye problem, macular degeneration. Baseline Creatinine 1.4 Tries to wear compression hose when she can.  catheterization in 2003 showing luminal irregularities, negative stress test in October 2010 with no ischemia, echocardiogram August 2010 showing normal systolic function, mild to moderate tricuspid regurgitation, mildly elevated right ventricular systolic pressures consistent with mild pulmonary hypertension, ejection fraction 60%.   Past Medical History:  Diagnosis Date  . Sherran Needs syndrome   . Diastolic CHF, acute (HCC)    Echo (8/10) with EF 60-65%, mild LVH, mild to moderate TR, PASP 40 mmHg.  . Dizziness    Thought to be side effect of Norvasc  . GERD (gastroesophageal reflux disease)   . Glaucoma   . HTN (hypertension)   . Hypercalcemia    On HCTZ  . Hypercholesterolemia   . Hyperkalemia   . Macular degeneration   . Osteoporosis   . Renal fibromuscular dysplasia ([place])   . Renal insufficiency   . S/P cardiac catheterization 05/2002   Luminal irregularities only in the coronaries. EF 55%. There was some irregulatrity in the right renal artery suggestive of possible fibromuscular dysplasia. Myoview in 5/08 showed no ischemia or infarction. Lexiscan myoview at [facility] (10/10) showed EF 55%, normal wall motion, no evience ofor ischemia or infarction.  . Scoliosis associated with other condition    Chronic back pain  . Uterine cancer ([place])    S/P hypsterectomy in 2008  . Wears hearing aid    bilateral  Past Surgical History:  Procedure Laterality Date  . AQUEOUS SHUNT Left 01/30/2017   Procedure: AQUEOUS SHUNT ahmed tube;  Surgeon: Ronnell Freshwater, MD;  Location: [facility];  Service:  Ophthalmology;  Laterality: Left;  ahmed tube shunt with scleral patch graft  . CARDIAC CATHETERIZATION     Left  . CARDIOVERSION N/A 08/06/2018   Procedure: CARDIOVERSION;  Surgeon: Wellington Hampshire, MD;  Location: [facility];  Service: Cardiovascular;  Laterality: N/A;  . CATARACT EXTRACTION Right 07/03/2013   [facility] Dr Wallace Going  . CATARACT EXTRACTION W/PHACO Left 01/30/2017   Procedure: CATARACT EXTRACTION PHACO AND INTRAOCULAR LENS PLACEMENT ([place])  left;  Surgeon: Ronnell Freshwater, MD;  Location: [facility];  Service: Ophthalmology;  Laterality: Left;  . REFRACTIVE SURGERY    . TEE WITHOUT CARDIOVERSION N/A 08/06/2018   Procedure: TRANSESOPHAGEAL ECHOCARDIOGRAM (TEE);  Surgeon: Wellington Hampshire, MD;  Location: [facility];  Service: Cardiovascular;  Laterality: N/A;  . TOTAL ABDOMINAL HYSTERECTOMY W/ BILATERAL SALPINGOOPHORECTOMY     stage I C well differentiated adenocarcinoma     No current facility-administered medications on file prior to encounter.    Current Outpatient Medications on File Prior to Encounter  Medication Sig Dispense Refill  . Acetaminophen (TYLENOL PO) Take 1 tablet by mouth as needed (PAIN).     Marland Kitchen apixaban (ELIQUIS) 2.5 MG TABS tablet Take 1 tablet (2.5 mg total) by mouth 2 (two) times daily. 60 tablet 0  . B Complex Vitamins (PA B-COMPLEX WITH B-12) TABS Take 1 tablet by mouth daily.      . Calcium Carbonate-Vitamin D (CALCIUM-VITAMIN D) 600-200 MG-UNIT CAPS Take 0.5 mg by mouth daily. Takes 1/2 tablet daily.     Marland Kitchen docusate sodium (STOOL SOFTENER) 100 MG capsule Take 100 mg by mouth daily as needed for mild constipation.    . Glucosamine-Chondroitin 1500-1200 MG/30ML LIQD Take 0.5 tablets by mouth daily. Takes 1/2 tablet daily.    . Magnesium 250 MG TABS Take 250 mg by mouth daily.    . metoprolol succinate (TOPROL-XL) 50 MG 24 hr tablet Take 1 tablet (50 mg total) by mouth daily. Take with or immediately following a meal. 30 tablet 0  . multivitamin  (THERAGRAN) per tablet Take 1 tablet by mouth daily.      Marland Kitchen nystatin (NYSTATIN) powder Apply 1 g topically 2 (two) times daily as needed (rash).    . Polyethylene Glycol 3350 (MIRALAX PO) Take by mouth daily.     . ranitidine (ZANTAC) 150 MG tablet Take 150 mg by mouth 2 (two) times daily.     . simvastatin (ZOCOR) 10 MG tablet Take 1 tablet (10 mg total) by mouth at bedtime. 90 tablet 3  . nystatin cream (MYCOSTATIN) Apply 1 application topically 2 (two) times daily. (Patient not taking: Reported on 08/23/2018) 30 g 0    Inpatient Medications: Scheduled Meds: . amiodarone  400 mg Oral BID  . apixaban  2.5 mg Oral BID  . metoprolol succinate  50 mg Oral Daily  . [START ON 08/24/2018] metoprolol succinate  50 mg Oral Daily   Continuous Infusions:  PRN Meds:   Allergies:    Allergies  Allergen Reactions  . Clonidine   . Benicar [Olmesartan] Other (See Comments)    Lip swelling  . Celebrex [Celecoxib]   . Iodine     Avoids due to decreased kidney function  . Penicillins Rash    Has patient had a PCN reaction causing immediate rash, facial/tongue/throat swelling, SOB or lightheadedness  with hypotension: Unknown Has patient had a PCN reaction causing severe rash involving mucus membranes or skin necrosis: Unknown Has patient had a PCN reaction that required hospitalization: Unknown Has patient had a PCN reaction occurring within the last 10 years: Unknown If all of the above answers are "NO", then may proceed with Cephalosporin use.     Social History:   Social History   Socioeconomic History  . Marital status: Widowed    Spouse name: Not on file  . Number of children: 2  . Years of education: Not on file  . Highest education level: Not on file  Occupational History  . Not on file  Social Needs  . Financial resource strain: Not hard at all  . Food insecurity:    Worry: Never true    Inability: Never true  . Transportation needs:    Medical: No    Non-medical: No    Tobacco Use  . Smoking status: Never Smoker  . Smokeless tobacco: Never Used  Substance and Sexual Activity  . Alcohol use: No    Alcohol/week: 0.0 standard drinks  . Drug use: No  . Sexual activity: Not on file  Lifestyle  . Physical activity:    Days per week: Not on file    Minutes per session: Not on file  . Stress: Not on file  Relationships  . Social connections:    Talks on phone: Not on file    Gets together: Not on file    Attends religious service: Not on file    Active member of club or organization: Not on file    Attends meetings of clubs or organizations: Not on file    Relationship status: Not on file  . Intimate partner violence:    Fear of current or ex partner: Not on file    Emotionally abused: Not on file    Physically abused: Not on file    Forced sexual activity: Not on file  Other Topics Concern  . Not on file  Social History Narrative  . Not on file    Family History:    Family History  Problem Relation Age of Onset  . Heart disease Father   . Heart disease Mother   . Parkinson's disease Mother   . Diabetes Cousin      ROS:  Please see the history of present illness.  Review of Systems  Constitutional: Positive for malaise/fatigue.  Respiratory: Positive for shortness of breath.   Cardiovascular: Positive for palpitations.  Gastrointestinal: Negative.   Musculoskeletal: Negative.   Neurological: Negative.   Psychiatric/Behavioral: The patient is nervous/anxious.   All other systems reviewed and are negative.    Physical Exam/Data:   Vitals:   08/23/18 1015 08/23/18 1030 08/23/18 1149 08/23/18 1245  BP: (!) 135/109 (!) 152/129 (!) 173/110 (!) 143/98  Pulse: (!) 106 (!) 50 (!) 55 (!) 49  Resp: (!) 21 (!) 24 (!) 21 20  Temp:      TempSrc:      SpO2: 98% 97% 98% 96%  Weight:      Height:        Intake/Output Summary (Last 24 hours) at 08/23/2018 1307 Last data filed at 08/23/2018 0929 Gross per 24 hour  Intake 90.92 ml   Output -  Net 90.92 ml   Filed Weights   08/23/18 0633  Weight: 59 kg   Body mass index is 22.31 kg/m.  General:  Well nourished, well developed, in no acute distress HEENT: normal Lymph:  no adenopathy Neck: no JVD Endocrine:  No thryomegaly Vascular: No carotid bruits; FA pulses 2+ bilaterally without bruits  Cardiac: Tachycardia, irregularly irregular Lungs:  clear to auscultation bilaterally, no wheezing, rhonchi or rales  Abd: soft, nontender, no hepatomegaly  Ext: no edema Musculoskeletal:  No deformities, BUE and BLE strength normal and equal Skin: warm and dry  Neuro:  CNs 2-12 intact, no focal abnormalities noted Psych:  Normal affect   EKG:   EKG personally reviewed by myself on todays visit Shows atrial flutter/fibrillation with ventricular rate 138 bpm no significant ST or T wave changes  Relevant CV Studies: Recent transesophageal echo with normal ejection fraction, greater than 55%  Laboratory Data:  Chemistry Recent Labs  Lab 08/23/18 0643  NA 137  K 4.3  CL 106  CO2 22  GLUCOSE 118*  BUN 36*  CREATININE 1.54*  CALCIUM 9.3  GFRNONAA 29*  GFRAA 33*  ANIONGAP 9    Recent Labs  Lab 08/23/18 0643  PROT 6.9  ALBUMIN 3.7  AST 167*  ALT 154*  ALKPHOS 79  BILITOT 0.7   Hematology Recent Labs  Lab 08/23/18 0643  WBC 7.9  RBC 3.81  HGB 12.1  HCT 36.0  MCV 94.4  MCH 31.8  MCHC 33.7  RDW 14.1  PLT 178   Cardiac Enzymes Recent Labs  Lab 08/23/18 0643  TROPONINI 0.03*   No results for input(s): TROPIPOC in the last 168 hours.  BNP Recent Labs  Lab 08/23/18 0643  BNP 712.0*    DDimer No results for input(s): DDIMER in the last 168 hours.  Radiology/Studies:  Dg Chest Port 1 View  Result Date: 08/23/2018 CLINICAL DATA:  AFib with rapid response EXAM: PORTABLE CHEST 1 VIEW COMPARISON:  08/12/2017 FINDINGS: Chronic cardiomegaly with aortic tortuosity. Lower mediastinal mass from hiatal hernia based on prior. Large lung volumes  with mild diaphragm flattening. There is no edema, consolidation, effusion, or pneumothorax. Hazy increased density at the medial right apex is attributed to technique. IMPRESSION: Chronic cardiomegaly.  Negative for failure. Electronically Signed   By: Monte Fantasia M.D.   On: 08/23/2018 08:38    Assessment and Plan:    atrial fibrillation/ flutter with RVR  shortness of breath, debilitated state, very anxious Lives alone, very elderly and frail Long discussion with her concerning various treatment options At our initial attempt at rate control and successful with amiodarone bolus, She will need additional amiodarone load for rate control Willing to consider cardioversion tomorrow morning if I will help her This has been scheduled for tomorrow morning She is not willing to have cardioversion today in the emergency room with quick discharge Recommend we continue Elliquis 2.5 BID Metoprolol succinate 100 mg daily, this is an increased dose Amiodarone, could start infusion up on the floor Alternatively could give 400 mg now and 400 mg tonight --Cardioversion has been scheduled for 7:30 tomorrow morning  Hypercholesterolemia continue low-dose simvastatin Stable  DIASTOLIC HEART FAILURE, CHRONIC New onset atrial flutter rate 130 bpm We will need to monitor closely for shortness of breath  Essential hypertension Recommend to increase metoprolol succinate up to 100 mg daily Other medications on hold  Edema, unspecified type Consistent with venous insufficiency,  continue compression hose and leg elevation  Chronic kidney disease (CKD), stage III (moderate) Lasix  3-4 times per week Creatinine 1.5  Visual hallucinations Etiology of her visual hallucinations is not clear Seem to happen after loss of her husband Suspect neurologic issue  Long discussion with patient, family  and friends at the bedside concerning various treatment options available for atrial  fibrillation Risk and benefit of cardioversion discussed with her, she is willing to proceed tomorrow  Total encounter time more than 110 minutes  Greater than 50% was spent in counseling and coordination of care with the patient   For questions or updates, please contact [place] Please consult [email or web address] for contact info under     Signed, Ida Rogue, MD  08/23/2018 1:07 PM

## 2018-08-23 NOTE — ED Provider Notes (Signed)
[facility] Emergency Department Provider Note ____________________________________________   I have reviewed the triage vital signs and the triage nursing note.  HISTORY  Chief Complaint Tachycardia and Atrial Fibrillation   Historian Patient  HPI Jill Castro is a 84 y.o. female with paroxysmal A. fib, new, had a recent cardioversion and is on Eliquis and metoprolol.  States she was at home and started to feel like she might get a headache, but did not get a headache, and feels just generally weak.  No chest pain.  States occasionally she will have mild shortness of breath.  No coughing.  No fevers.  Symptoms mild.    Past Medical History:  Diagnosis Date  . Sherran Needs syndrome   . Diastolic CHF, acute (HCC)    Echo (8/10) with EF 60-65%, mild LVH, mild to moderate TR, PASP 40 mmHg.  . Dizziness    Thought to be side effect of Norvasc  . GERD (gastroesophageal reflux disease)   . Glaucoma   . HTN (hypertension)   . Hypercalcemia    On HCTZ  . Hypercholesterolemia   . Hyperkalemia   . Macular degeneration   . Osteoporosis   . Renal fibromuscular dysplasia ([place])   . Renal insufficiency   . S/P cardiac catheterization 05/2002   Luminal irregularities only in the coronaries. EF 55%. There was some irregulatrity in the right renal artery suggestive of possible fibromuscular dysplasia. Myoview in 5/08 showed no ischemia or infarction. Lexiscan myoview at [facility] (10/10) showed EF 55%, normal wall motion, no evience ofor ischemia or infarction.  . Scoliosis associated with other condition    Chronic back pain  . Uterine cancer ([place])    S/P hypsterectomy in 2008  . Wears hearing aid    bilateral    Patient Active Problem List   Diagnosis Date Noted  . Atrial flutter ([place]) 08/03/2018  . Cervical lymphadenopathy 12/08/2017  . Loose stools 08/20/2017  . Depression 01/29/2017  . Visual hallucinations 07/21/2016  . Loss of weight 05/12/2016   . Facial lesion 08/30/2015  . Health care maintenance 04/22/2015  . DOE (dyspnea on exertion) 02/03/2015  . Drainage from nose 10/27/2014  . GERD (gastroesophageal reflux disease) 03/15/2013  . Right shoulder pain 11/30/2012  . Chronic kidney disease (CKD), stage III (moderate) ([place]) 10/18/2012  . Hyperkalemia 10/18/2012  . Osteoporosis 10/18/2012  . History of endometrial cancer 10/18/2012  . Chronic back pain 10/18/2012  . Hypertension 10/16/2012  . EDEMA 10/30/2009  . Hypercholesterolemia 09/22/2009  . Chest pain 09/08/2009  . Chronic diastolic CHF (congestive heart failure) ([place]) 07/07/2009  . Shortness of breath 07/07/2009    Past Surgical History:  Procedure Laterality Date  . AQUEOUS SHUNT Left 01/30/2017   Procedure: AQUEOUS SHUNT ahmed tube;  Surgeon: Ronnell Freshwater, MD;  Location: [place];  Service: Ophthalmology;  Laterality: Left;  ahmed tube shunt with scleral patch graft  . CARDIAC CATHETERIZATION     Left  . CARDIOVERSION N/A 08/06/2018   Procedure: CARDIOVERSION;  Surgeon: Wellington Hampshire, MD;  Location: [facility];  Service: Cardiovascular;  Laterality: N/A;  . CATARACT EXTRACTION Right 07/03/2013   [facility] Dr Wallace Going  . CATARACT EXTRACTION W/PHACO Left 01/30/2017   Procedure: CATARACT EXTRACTION PHACO AND INTRAOCULAR LENS PLACEMENT ([place])  left;  Surgeon: Ronnell Freshwater, MD;  Location: [place];  Service: Ophthalmology;  Laterality: Left;  . REFRACTIVE SURGERY    . TEE WITHOUT CARDIOVERSION N/A 08/06/2018   Procedure: TRANSESOPHAGEAL ECHOCARDIOGRAM (TEE);  Surgeon: Wellington Hampshire, MD;  Location: [facility];  Service: Cardiovascular;  Laterality: N/A;  . TOTAL ABDOMINAL HYSTERECTOMY W/ BILATERAL SALPINGOOPHORECTOMY     stage I C well differentiated adenocarcinoma    Prior to Admission medications   Medication Sig Start Date End Date Taking? Authorizing Provider  Acetaminophen (TYLENOL PO) Take 1 tablet by mouth as needed  (PAIN).    Yes [provider]  apixaban (ELIQUIS) 2.5 MG TABS tablet Take 1 tablet (2.5 mg total) by mouth 2 (two) times daily. 08/07/18 09/06/18 Yes Gouru, Illene Silver, MD  B Complex Vitamins (PA B-COMPLEX WITH B-12) TABS Take 1 tablet by mouth daily.     Yes [provider]  Calcium Carbonate-Vitamin D (CALCIUM-VITAMIN D) 600-200 MG-UNIT CAPS Take 0.5 mg by mouth daily. Takes 1/2 tablet daily.    Yes [provider]  docusate sodium (STOOL SOFTENER) 100 MG capsule Take 100 mg by mouth daily as needed for mild constipation.   Yes [provider]  Glucosamine-Chondroitin 1500-1200 MG/30ML LIQD Take 0.5 tablets by mouth daily. Takes 1/2 tablet daily.   Yes [provider]  Magnesium 250 MG TABS Take 250 mg by mouth daily.   Yes [provider]  metoprolol succinate (TOPROL-XL) 50 MG 24 hr tablet Take 1 tablet (50 mg total) by mouth daily. Take with or immediately following a meal. 08/08/18  Yes Gouru, Aruna, MD  multivitamin [facility]) per tablet Take 1 tablet by mouth daily.     Yes [provider]  nystatin (NYSTATIN) powder Apply 1 g topically 2 (two) times daily as needed (rash).   Yes [provider]  Polyethylene Glycol 3350 (MIRALAX PO) Take by mouth daily.    Yes [provider]  ranitidine (ZANTAC) 150 MG tablet Take 150 mg by mouth 2 (two) times daily.  05/01/17  Yes [provider]  simvastatin (ZOCOR) 10 MG tablet Take 1 tablet (10 mg total) by mouth at bedtime. 08/14/17  Yes Gollan, Kathlene November, MD  nystatin cream (MYCOSTATIN) Apply 1 application topically 2 (two) times daily. Patient not taking: Reported on 08/23/2018 01/26/17   Einar Pheasant, MD    Allergies  Allergen Reactions  . Clonidine   . Benicar [Olmesartan] Other (See Comments)    Lip swelling  . Celebrex [Celecoxib]   . Iodine     Avoids due to decreased kidney function  . Penicillins Rash    Has patient had a PCN reaction causing immediate  rash, facial/tongue/throat swelling, SOB or lightheadedness with hypotension: Unknown Has patient had a PCN reaction causing severe rash involving mucus membranes or skin necrosis: Unknown Has patient had a PCN reaction that required hospitalization: Unknown Has patient had a PCN reaction occurring within the last 10 years: Unknown If all of the above answers are "NO", then may proceed with Cephalosporin use.     Family History  Problem Relation Age of Onset  . Heart disease Father   . Heart disease Mother   . Parkinson's disease Mother   . Diabetes Cousin     Social History Social History   Tobacco Use  . Smoking status: Never Smoker  . Smokeless tobacco: Never Used  Substance Use Topics  . Alcohol use: No    Alcohol/week: 0.0 standard drinks  . Drug use: No    Review of Systems  Constitutional: Negative for fever. Eyes: Negative for visual changes. ENT: Negative for sore throat. Cardiovascular: Negative for chest pain. Respiratory: Mild intermittent shortness of breath. Gastrointestinal: Negative for abdominal pain,  vomiting and diarrhea. Genitourinary: Negative for dysuria. Musculoskeletal: Negative for back pain. Skin: Negative for rash. Neurological: Negative for headache.  ____________________________________________   PHYSICAL EXAM:  VITAL SIGNS: ED Triage Vitals [08/23/18 0633]  Enc Vitals Group     BP (!) 155/119     Pulse Rate (!) 111     Resp 18     Temp 97.6 F (36.4 C)     Temp Source Oral     SpO2 98 %     Weight 130 lb (59 kg)     Height 5\' 4"  (1.626 m)     Head Circumference      Peak Flow      Pain Score 0     Pain Loc      Pain Edu?      Excl. in [place]?      Constitutional: Alert and oriented.  Hard of hearing. HEENT      Head: Normocephalic and atraumatic.      Eyes: Conjunctivae are normal. Pupils equal and round.       Ears:         Nose: No congestion/rhinnorhea.      Mouth/Throat: Mucous membranes are moist.      Neck: No  stridor. Cardiovascular/Chest: Irregularly irregular.  No murmurs, rubs, or gallops. Respiratory: Normal respiratory effort without tachypnea nor retractions. Breath sounds are clear and equal bilaterally. No wheezes/rales/rhonchi. Gastrointestinal: Soft. No distention, no guarding, no rebound. Nontender.    Genitourinary/rectal:  Deferred Musculoskeletal: Nontender with normal range of motion in all extremities. No joint effusions.  No lower extremity tenderness.  No edema. Neurologic:  Normal speech and language. No gross or focal neurologic deficits are appreciated. Skin:  Skin is warm, dry and intact. No rash noted. Psychiatric: Mood and affect are normal. Speech and behavior are normal. Patient exhibits appropriate insight and judgment.   ____________________________________________  LABS (pertinent positives/negatives) I, Lisa Roca, MD the attending physician have reviewed the labs noted below.  Labs Reviewed  TROPONIN I - Abnormal; Notable for the following components:      Result Value   Troponin I 0.03 (*)    All other components within normal limits  MAGNESIUM - Abnormal; Notable for the following components:   Magnesium 2.7 (*)    All other components within normal limits  COMPREHENSIVE METABOLIC PANEL - Abnormal; Notable for the following components:   Glucose, Bld 118 (*)    BUN 36 (*)    Creatinine, Ser 1.54 (*)    AST 167 (*)    ALT 154 (*)    GFR calc non Af Amer 29 (*)    GFR calc Af Amer 33 (*)    All other components within normal limits  LIPASE, BLOOD - Abnormal; Notable for the following components:   Lipase 63 (*)    All other components within normal limits  APTT - Abnormal; Notable for the following components:   aPTT 37 (*)    All other components within normal limits  BRAIN NATRIURETIC PEPTIDE - Abnormal; Notable for the following components:   B Natriuretic Peptide 712.0 (*)    All other components within normal limits  CBC WITH  DIFFERENTIAL/PLATELET  PROTIME-INR    ____________________________________________    EKG I, Lisa Roca, MD, the attending physician have personally viewed and interpreted all ECGs.  Atrial fibrillation with rapid ventricular response.  138 bpm.  Narrow QS per normal axis.  Normal ST and T wave ____________________________________________  RADIOLOGY   X-ray, radiologist report  reviewed: Chronic cardiomegaly. __________________________________________  PROCEDURES  Procedure(s) performed: None  Procedures  Critical Care performed: CRITICAL CARE Performed by: Lisa Roca   Total critical care time: 30 minutes  Critical care time was exclusive of separately billable procedures and treating other patients.  Critical care was necessary to treat or prevent imminent or life-threatening deterioration.  Critical care was time spent personally by me on the following activities: development of treatment plan with patient and/or surrogate as well as nursing, discussions with consultants, evaluation of patient's response to treatment, examination of patient, obtaining history from patient or surrogate, ordering and performing treatments and interventions, ordering and review of laboratory studies, ordering and review of radiographic studies, pulse oximetry and re-evaluation of patient's condition.    ____________________________________________  ED COURSE / ASSESSMENT AND PLAN  Pertinent labs & imaging results that were available during my care of the patient were reviewed by me and considered in my medical decision making (see chart for details).    I reviewed patient's recent discharge summary in chart history.  Patient had recent cardioversion and is on Eliquis and Toprol now.  Patient started feeling bad overnight, is found to be back in rapid A. Fib.  Per chart history, patient had pauses with diltiazem.  Patient was given IV dose of Lopressor with some  improvement.  Discussed with patient's cardiologist Dr. Rockey Situ who recommends IV amiodarone bolus which she was given here in the emergency department.  After 2 hours heart rate mostly around 110, down from the 130s 140s.  Patient was given several options, and plan will be to continue the load with p.o. amiodarone 400 mg 3 times daily, admit for hospital observation overnight and likely cardioversion tomorrow.    CONSULTATIONS: Dr. Rockey Situ, commended IV amiodarone bolus, after 2 hours he came to the ED to speak with patient, patient will be admitted.   Patient / Family / Caregiver informed of clinical course, medical decision-making process, and agree with plan.   ___________________________________________   FINAL CLINICAL IMPRESSION(S) / ED DIAGNOSES   Final diagnoses:  Atrial fibrillation with rapid ventricular response ([place])      ___________________________________________         Note: This dictation was prepared with Dragon dictation. Any transcriptional errors that result from this process are unintentional    Lisa Roca, MD 08/23/18 1215

## 2018-08-23 NOTE — H&P (Signed)
[place] [facility] [place] NAME: Jill Castro    MR#:  [id number]  DATE OF BIRTH:  Oct 11, 1927  DATE OF ADMISSION:  08/23/2018  PRIMARY CARE PHYSICIAN: Einar Pheasant, MD   REQUESTING/REFERRING PHYSICIAN:   CHIEF COMPLAINT:   Chief Complaint  Patient presents with  . Tachycardia  . Atrial Fibrillation    HISTORY OF PRESENT ILLNESS: Jill Castro  is a 84 y.o. female with a known history of diastolic heart failure, GERD, glaucoma, hypertension, hyperlipidemia, macular degeneration, osteoporosis presented to the emergency room for shortness of breath and heart racing.  Patient felt she had a palpitations and her heart was racing.  When she presented to emergency room she was in atrial fibrillation with rapid rate.  She was evaluated by [facility] health cardiology Dr. Rockey Situ in the emergency room.  Patient was given IV amiodarone loading dose.  She was started on oral amiodarone with plan for cardioversion tomorrow.  And will continue oral Eliquis for anticoagulation.  No complaints of any chest pain, dizziness.  No history of any syncope.  PAST MEDICAL HISTORY:   Past Medical History:  Diagnosis Date  . Sherran Needs syndrome   . Diastolic CHF, acute (HCC)    Echo (8/10) with EF 60-65%, mild LVH, mild to moderate TR, PASP 40 mmHg.  . Dizziness    Thought to be side effect of Norvasc  . GERD (gastroesophageal reflux disease)   . Glaucoma   . HTN (hypertension)   . Hypercalcemia    On HCTZ  . Hypercholesterolemia   . Hyperkalemia   . Macular degeneration   . Osteoporosis   . Renal fibromuscular dysplasia ([place])   . Renal insufficiency   . S/P cardiac catheterization 05/2002   Luminal irregularities only in the coronaries. EF 55%. There was some irregulatrity in the right renal artery suggestive of possible fibromuscular dysplasia. Myoview in 5/08 showed no ischemia or infarction. Lexiscan myoview at [facility] (10/10) showed EF 55%, normal  wall motion, no evience ofor ischemia or infarction.  . Scoliosis associated with other condition    Chronic back pain  . Uterine cancer ([place])    S/P hypsterectomy in 2008  . Wears hearing aid    bilateral    PAST SURGICAL HISTORY:  Past Surgical History:  Procedure Laterality Date  . AQUEOUS SHUNT Left 01/30/2017   Procedure: AQUEOUS SHUNT ahmed tube;  Surgeon: Ronnell Freshwater, MD;  Location: [place];  Service: Ophthalmology;  Laterality: Left;  ahmed tube shunt with scleral patch graft  . CARDIAC CATHETERIZATION     Left  . CARDIOVERSION N/A 08/06/2018   Procedure: CARDIOVERSION;  Surgeon: Wellington Hampshire, MD;  Location: [facility] ORS;  Service: Cardiovascular;  Laterality: N/A;  . CATARACT EXTRACTION Right 07/03/2013   [facility] Dr Wallace Going  . CATARACT EXTRACTION W/PHACO Left 01/30/2017   Procedure: CATARACT EXTRACTION PHACO AND INTRAOCULAR LENS PLACEMENT ([place])  left;  Surgeon: Ronnell Freshwater, MD;  Location: [place];  Service: Ophthalmology;  Laterality: Left;  . REFRACTIVE SURGERY    . TEE WITHOUT CARDIOVERSION N/A 08/06/2018   Procedure: TRANSESOPHAGEAL ECHOCARDIOGRAM (TEE);  Surgeon: Wellington Hampshire, MD;  Location: [facility] OR[facility];  Service: Cardiovascular;  Laterality: N/A;  . TOTAL ABDOMINAL HYSTERECTOMY W/ BILATERAL SALPINGOOPHORECTOMY     stage I C well differentiated adenocarcinoma    SOCIAL HISTORY:  Social History   Tobacco Use  . Smoking status: Never Smoker  . Smokeless tobacco: Never Used  Substance Use Topics  .  Alcohol use: No    Alcohol/week: 0.0 standard drinks    FAMILY HISTORY:  Family History  Problem Relation Age of Onset  . Heart disease Father   . Heart disease Mother   . Parkinson's disease Mother   . Diabetes Cousin     DRUG ALLERGIES:  Allergies  Allergen Reactions  . Clonidine   . Benicar [Olmesartan] Other (See Comments)    Lip swelling  . Celebrex [Celecoxib]   . Iodine     Avoids due to decreased kidney  function  . Penicillins Rash    Has patient had a PCN reaction causing immediate rash, facial/tongue/throat swelling, SOB or lightheadedness with hypotension: Unknown Has patient had a PCN reaction causing severe rash involving mucus membranes or skin necrosis: Unknown Has patient had a PCN reaction that required hospitalization: Unknown Has patient had a PCN reaction occurring within the last 10 years: Unknown If all of the above answers are "NO", then may proceed with Cephalosporin use.     REVIEW OF SYSTEMS:   CONSTITUTIONAL: No fever, fatigue or weakness.  EYES: No blurred or double vision.  EARS, NOSE, AND THROAT: No tinnitus or ear pain.  RESPIRATORY: No cough, shortness of breath, wheezing or hemoptysis.  CARDIOVASCULAR: No chest pain, orthopnea, edema.  Had palpitations GASTROINTESTINAL: No nausea, vomiting, diarrhea or abdominal pain.  GENITOURINARY: No dysuria, hematuria.  ENDOCRINE: No polyuria, nocturia,  HEMATOLOGY: No anemia, easy bruising or bleeding SKIN: No rash or lesion. MUSCULOSKELETAL: No joint pain or arthritis.   NEUROLOGIC: No tingling, numbness, weakness.  PSYCHIATRY: No anxiety or depression.   MEDICATIONS AT HOME:  Prior to Admission medications   Medication Sig Start Date End Date Taking? Authorizing Provider  Acetaminophen (TYLENOL PO) Take 1 tablet by mouth as needed (PAIN).    Yes [provider]  apixaban (ELIQUIS) 2.5 MG TABS tablet Take 1 tablet (2.5 mg total) by mouth 2 (two) times daily. 08/07/18 09/06/18 Yes Gouru, Illene Silver, MD  B Complex Vitamins (PA B-COMPLEX WITH B-12) TABS Take 1 tablet by mouth daily.     Yes [provider]  Calcium Carbonate-Vitamin D (CALCIUM-VITAMIN D) 600-200 MG-UNIT CAPS Take 0.5 mg by mouth daily. Takes 1/2 tablet daily.    Yes [provider]  docusate sodium (STOOL SOFTENER) 100 MG capsule Take 100 mg by mouth daily as needed for mild constipation.   Yes [provider]   Glucosamine-Chondroitin 1500-1200 MG/30ML LIQD Take 0.5 tablets by mouth daily. Takes 1/2 tablet daily.   Yes [provider]  Magnesium 250 MG TABS Take 250 mg by mouth daily.   Yes [provider]  metoprolol succinate (TOPROL-XL) 50 MG 24 hr tablet Take 1 tablet (50 mg total) by mouth daily. Take with or immediately following a meal. 08/08/18  Yes Gouru, Aruna, MD  multivitamin [facility]) per tablet Take 1 tablet by mouth daily.     Yes [provider]  nystatin (NYSTATIN) powder Apply 1 g topically 2 (two) times daily as needed (rash).   Yes [provider]  Polyethylene Glycol 3350 (MIRALAX PO) Take by mouth daily.    Yes [provider]  ranitidine (ZANTAC) 150 MG tablet Take 150 mg by mouth 2 (two) times daily.  05/01/17  Yes [provider]  simvastatin (ZOCOR) 10 MG tablet Take 1 tablet (10 mg total) by mouth at bedtime. 08/14/17  Yes Gollan, Kathlene November, MD  nystatin cream (MYCOSTATIN) Apply 1 application topically 2 (two) times daily. Patient not taking: Reported on 08/23/2018  01/26/17   Einar Pheasant, MD      PHYSICAL EXAMINATION:   VITAL SIGNS: Blood pressure (!) 173/110, pulse (!) 55, temperature 97.6 F (36.4 C), temperature source Oral, resp. rate (!) 21, height 5\' 4"  (1.626 m), weight 59 kg, SpO2 98 %.  GENERAL:  84 y.o.-year-old patient lying in the bed with no acute distress.  EYES: Pupils equal, round, reactive to light and accommodation. No scleral icterus. Extraocular muscles intact.  HEENT: Head atraumatic, normocephalic. Oropharynx and nasopharynx clear.  NECK:  Supple, no jugular venous distention. No thyroid enlargement, no tenderness.  LUNGS: Normal breath sounds bilaterally, no wheezing, rales,rhonchi or crepitation. No use of accessory muscles of respiration.  CARDIOVASCULAR: S1, S2 irregular. No murmurs, rubs, or gallops.  ABDOMEN: Soft, nontender, nondistended. Bowel sounds present. No organomegaly or mass.   EXTREMITIES: No pedal edema, cyanosis, or clubbing.  NEUROLOGIC: Cranial nerves II through XII are intact. Muscle strength 5/5 in all extremities. Sensation intact. Gait not checked.  PSYCHIATRIC: The patient is alert and oriented x 3.  SKIN: No obvious rash, lesion, or ulcer.   LABORATORY PANEL:   CBC Recent Labs  Lab 08/23/18 0643  WBC 7.9  HGB 12.1  HCT 36.0  PLT 178  MCV 94.4  MCH 31.8  MCHC 33.7  RDW 14.1  LYMPHSABS 1.7  MONOABS 0.8  EOSABS 0.2  BASOSABS 0.0   ------------------------------------------------------------------------------------------------------------------  Chemistries  Recent Labs  Lab 08/23/18 0643  NA 137  K 4.3  CL 106  CO2 22  GLUCOSE 118*  BUN 36*  CREATININE 1.54*  CALCIUM 9.3  MG 2.7*  AST 167*  ALT 154*  ALKPHOS 79  BILITOT 0.7   ------------------------------------------------------------------------------------------------------------------ estimated creatinine clearance is 21.4 mL/min (A) (by C-G formula based on SCr of 1.54 mg/dL (H)). ------------------------------------------------------------------------------------------------------------------ No results for input(s): TSH, T4TOTAL, T3FREE, THYROIDAB in the last 72 hours.  Invalid input(s): FREET3   Coagulation profile Recent Labs  Lab 08/23/18 0813  INR 1.15   ------------------------------------------------------------------------------------------------------------------- No results for input(s): DDIMER in the last 72 hours. -------------------------------------------------------------------------------------------------------------------  Cardiac Enzymes Recent Labs  Lab 08/23/18 0643  TROPONINI 0.03*   ------------------------------------------------------------------------------------------------------------------ Invalid input(s):  POCBNP  ---------------------------------------------------------------------------------------------------------------  Urinalysis    Component Value Date/Time   COLORURINE YELLOW 07/20/2016 [employer] 07/20/2016 1227   LABSPEC 1.010 07/20/2016 1227   PHURINE 6.0 07/20/2016 1227   GLUCOSEU NEGATIVE 07/20/2016 1227   HGBUR NEGATIVE 07/20/2016 1227   [place] 07/20/2016 1227   KETONESUR NEGATIVE 07/20/2016 1227   UROBILINOGEN 0.2 07/20/2016 1227   NITRITE NEGATIVE 07/20/2016 1227   LEUKOCYTESUR NEGATIVE 07/20/2016 1227     RADIOLOGY: Dg Chest Port 1 View  Result Date: 08/23/2018 CLINICAL DATA:  AFib with rapid response EXAM: PORTABLE CHEST 1 VIEW COMPARISON:  08/12/2017 FINDINGS: Chronic cardiomegaly with aortic tortuosity. Lower mediastinal mass from hiatal hernia based on prior. Large lung volumes with mild diaphragm flattening. There is no edema, consolidation, effusion, or pneumothorax. Hazy increased density at the medial right apex is attributed to technique. IMPRESSION: Chronic cardiomegaly.  Negative for failure. Electronically Signed   By: Monte Fantasia M.D.   On: 08/23/2018 08:38    EKG: Orders placed or performed during the hospital encounter of 08/23/18  . EKG 12-Lead  . EKG 12-Lead    IMPRESSION AND PLAN:  84 year old female patient with history of Charles Bonnett syndrome, chronic diastolic heart failure, hypertension, hyperlipidemia, glaucoma presented to the emergency room for shortness of breath and heart racing  -Atrial fibrillation with rapid  rate IV amiodarone given in the emergency room Start oral amiodarone Continue beta-blocker for rate control Plan for cardioversion in the morning by cardiology N.p.o. after midnight  -Chronic stable diastolic heart failure Medical management to continue  -Hyperlipidemia Continue statin medication  -DVT prophylaxis On anticoagulation with oral Eliquis  All the records are reviewed  and case discussed with ED provider. Management plans discussed with the patient, family and they are in agreement.  CODE STATUS:Full code Code Status History    Date Active Date Inactive Code Status Order ID Comments User Context   08/03/2018 1852 08/07/2018 1703 Full Code [id number]  Fritzi Mandes, MD Inpatient   08/12/2017 1536 08/13/2017 1657 Full Code [id number]  Vaughan Basta, MD Inpatient    Advance Directive Documentation     Most Recent Value  Type of Advance Directive  Healthcare Power of [place], Living will  (Pended)   Pre-existing out of facility DNR order (yellow form or pink MOST form)  -  "MOST" Form in Place?  -       TOTAL TIME TAKING CARE OF THIS PATIENT: 52 minutes.    Saundra Shelling M.D on 08/23/2018 at 12:48 PM  Between 7am to 6pm - Pager - [phone]  After 6pm go to [email or web address] - password [email or web address] [place] [facility]  Office  [phone]  CC: Primary care physician; Einar Pheasant, MD

## 2018-08-23 NOTE — ED Notes (Signed)
Pt stated that she has not been feeling well and her BP was elevated and she has been taking her BP medication. Pt denies any chest pain of feeling SOB.

## 2018-08-23 NOTE — Progress Notes (Signed)
Advanced care plan.  Purpose of the Encounter: CODE STATUS  Parties in Attendance: Patient  Patient's Decision Capacity: Good  Subjective/Patient's story: Presented to the emergency room for shortness of breath and palpitations   Objective/Medical story Has atrial fibrillation with rapid rate Patient will need IV amiodarone for rate control Needs cardioversion and cardiology evaluation   Goals of care determination:  Advance care directives and goals of care discussed with the patient For now patient wants everything done which includes CPR, intubation ventilator if the need arises   CODE STATUS: Full code   Time spent discussing advanced care planning: 16 minutes

## 2018-08-23 NOTE — ED Notes (Signed)
Dr Reita Cliche notified of trop 0.03 in person. No new orders

## 2018-08-24 ENCOUNTER — Encounter
Admission: EM | Disposition: A | Discharge status: Home Health | Payer: Self-pay | Source: Home / Self Care | Attending: Internal Medicine

## 2018-08-24 ENCOUNTER — Encounter: Payer: Self-pay | Admitting: Anesthesiology

## 2018-08-24 ENCOUNTER — Inpatient Hospital Stay: Payer: Medicare Other | Admitting: Anesthesiology

## 2018-08-24 ENCOUNTER — Telehealth: Payer: Self-pay

## 2018-08-24 DIAGNOSIS — I4891 Unspecified atrial fibrillation: Secondary | ICD-10-CM

## 2018-08-24 DIAGNOSIS — E43 Unspecified severe protein-calorie malnutrition: Secondary | ICD-10-CM

## 2018-08-24 HISTORY — PX: CARDIOVERSION: EP1203

## 2018-08-24 LAB — BASIC METABOLIC PANEL
ANION GAP: 7 (ref 5–15)
BUN: 31 mg/dL — ABNORMAL HIGH (ref 8–23)
CHLORIDE: 109 mmol/L (ref 98–111)
CO2: 24 mmol/L (ref 22–32)
Calcium: 9.6 mg/dL (ref 8.9–10.3)
Creatinine, Ser: 1.4 mg/dL — ABNORMAL HIGH (ref 0.44–1.00)
GFR calc non Af Amer: 32 mL/min — ABNORMAL LOW (ref >60)
GFR, EST AFRICAN AMERICAN: 37 mL/min — AB (ref >60)
Glucose, Bld: 117 mg/dL — ABNORMAL HIGH (ref 70–99)
Potassium: 4.2 mmol/L (ref 3.5–5.1)
Sodium: 140 mmol/L (ref 135–145)

## 2018-08-24 LAB — LIPID PANEL
CHOL/HDL RATIO: 2.2 ratio
Cholesterol: 129 mg/dL (ref 0–200)
HDL: 58 mg/dL (ref >40)
LDL CALC: 57 mg/dL (ref 0–99)
TRIGLYCERIDES: 68 mg/dL (ref <150)
VLDL: 14 mg/dL (ref 0–40)

## 2018-08-24 LAB — TROPONIN I: Troponin I: 0.03 ng/mL (ref <0.03)

## 2018-08-24 SURGERY — CARDIOVERSION (CATH LAB)
Anesthesia: General

## 2018-08-24 MED ORDER — PROPOFOL 10 MG/ML IV BOLUS
INTRAVENOUS | Status: AC
  Filled 2018-08-24: qty 20

## 2018-08-24 MED ORDER — PROPOFOL 10 MG/ML IV BOLUS
INTRAVENOUS | Status: DC | PRN
  Administered 2018-08-24: 30 mg via INTRAVENOUS

## 2018-08-24 MED ORDER — AMIODARONE HCL 200 MG PO TABS
400.0000 mg | ORAL_TABLET | Freq: Two times a day (BID) | ORAL | Status: DC
  Administered 2018-08-24: 400 mg via ORAL
  Filled 2018-08-24: qty 2

## 2018-08-24 MED ORDER — AMIODARONE HCL 200 MG PO TABS: ORAL_TABLET | ORAL | 1 refills | Status: DC

## 2018-08-24 NOTE — Anesthesia Procedure Notes (Signed)
Date/Time: 08/24/2018 7:34 AM Performed by: Johnna Acosta, CRNA Pre-anesthesia Checklist: Patient identified, Emergency Drugs available, Suction available, Patient being monitored and Timeout performed Patient Re-evaluated:Patient Re-evaluated prior to induction Oxygen Delivery Method: Nasal cannula Preoxygenation: Pre-oxygenation with 100% oxygen

## 2018-08-24 NOTE — Transfer of Care (Signed)
Immediate Anesthesia Transfer of Care Note  Patient: JANNAH GUARDIOLA  Procedure(s) Performed: CARDIOVERSION (N/A )  Patient Location: PACU  Anesthesia Type:General  Level of Consciousness: sedated  Airway & Oxygen Therapy: Patient Spontanous Breathing and Patient connected to nasal cannula oxygen  Post-op Assessment: Report given to RN and Post -op Vital signs reviewed and stable  Post vital signs: Reviewed and stable  Last Vitals:  Vitals Value Taken Time  BP 94/60 08/24/2018  7:46 AM  Temp    Pulse 58 08/24/2018  7:46 AM  Resp 16 08/24/2018  7:46 AM  SpO2 98 % 08/24/2018  7:46 AM    Last Pain:  Vitals:   08/24/18 0713  TempSrc: Oral  PainSc:          Complications: No apparent anesthesia complications

## 2018-08-24 NOTE — Discharge Summary (Signed)
[place] [facility] [place] NAME: Jill Castro    MR#:  [id number]  DATE OF BIRTH:  [birth year]  DATE OF ADMISSION:  08/23/2018   ADMITTING PHYSICIAN: Saundra Shelling, MD  DATE OF DISCHARGE: 08/24/2018 PRIMARY CARE PHYSICIAN: Einar Pheasant, MD   ADMISSION DIAGNOSIS:  Atrial fibrillation with rapid ventricular response ([place]) [I48.91] DISCHARGE DIAGNOSIS:  Active Problems:   A-fib ([place])  SECONDARY DIAGNOSIS:   Past Medical History:  Diagnosis Date  . Sherran Needs syndrome   . Diastolic CHF, acute (HCC)    Echo (8/10) with EF 60-65%, mild LVH, mild to moderate TR, PASP 40 mmHg.  . Dizziness    Thought to be side effect of Norvasc  . GERD (gastroesophageal reflux disease)   . Glaucoma   . HTN (hypertension)   . Hypercalcemia    On HCTZ  . Hypercholesterolemia   . Hyperkalemia   . Macular degeneration   . Osteoporosis   . Renal fibromuscular dysplasia ([place])   . Renal insufficiency   . S/P cardiac catheterization 05/2002   Luminal irregularities only in the coronaries. EF 55%. There was some irregulatrity in the right renal artery suggestive of possible fibromuscular dysplasia. Myoview in 5/08 showed no ischemia or infarction. Lexiscan myoview at [facility] (10/10) showed EF 55%, normal wall motion, no evience ofor ischemia or infarction.  . Scoliosis associated with other condition    Chronic back pain  . Uterine cancer ([place])    S/P hypsterectomy in 2008  . Wears hearing aid    bilateral   HOSPITAL COURSE:  84 year old female patient with history of Charles Bonnett syndrome, chronic diastolic heart failure, hypertension, hyperlipidemia, glaucoma presented to the emergency room for shortness of breath and heart racing  -Atrial fibrillation with rapid rate She is treated with IV amiodarone change to oral amiodarone 400 mg p.o twice daily for 5 days then changed to 200 mg twice daily, continue Eliquis per Dr. Rockey Situ.  Continue  beta-blocker for rate control S/P cardioversion in the morning by Dr. Rockey Situ.  -Chronic stable diastolic heart failure Medical management to continue  -Hyperlipidemia Continue statin medication  CKD stage III.  Stable. DISCHARGE CONDITIONS:  Stable, discharge to home with home health and PT today. CONSULTS OBTAINED:  Treatment Team:  Minna Merritts, MD DRUG ALLERGIES:   Allergies  Allergen Reactions  . Clonidine   . Benicar [Olmesartan] Other (See Comments)    Lip swelling  . Celebrex [Celecoxib]   . Iodine     Avoids due to decreased kidney function  . Penicillins Rash    Has patient had a PCN reaction causing immediate rash, facial/tongue/throat swelling, SOB or lightheadedness with hypotension: Unknown Has patient had a PCN reaction causing severe rash involving mucus membranes or skin necrosis: Unknown Has patient had a PCN reaction that required hospitalization: Unknown Has patient had a PCN reaction occurring within the last 10 years: Unknown If all of the above answers are "NO", then may proceed with Cephalosporin use.    DISCHARGE MEDICATIONS:   Allergies as of 08/24/2018      Reactions   Clonidine    Benicar [olmesartan] Other (See Comments)   Lip swelling   Celebrex [celecoxib]    Iodine    Avoids due to decreased kidney function   Penicillins Rash   Has patient had a PCN reaction causing immediate rash, facial/tongue/throat swelling, SOB or lightheadedness with hypotension: Unknown Has patient had a PCN reaction causing severe rash involving mucus membranes or  skin necrosis: Unknown Has patient had a PCN reaction that required hospitalization: Unknown Has patient had a PCN reaction occurring within the last 10 years: Unknown If all of the above answers are "NO", then may proceed with Cephalosporin use.      Medication List    TAKE these medications   amiodarone 200 MG tablet Commonly known as:  PACERONE 400 mg po bid for 5 days, then 200 mg po  bid.   apixaban 2.5 MG Tabs tablet Commonly known as:  ELIQUIS Take 1 tablet (2.5 mg total) by mouth 2 (two) times daily.   Calcium-Vitamin D 600-200 MG-UNIT Caps Take 0.5 mg by mouth daily. Takes 1/2 tablet daily.   Glucosamine-Chondroitin 1500-1200 MG/30ML Liqd Take 0.5 tablets by mouth daily. Takes 1/2 tablet daily.   Magnesium 250 MG Tabs Take 250 mg by mouth daily.   metoprolol succinate 50 MG 24 hr tablet Commonly known as:  TOPROL-XL Take 1 tablet (50 mg total) by mouth daily. Take with or immediately following a meal.   MIRALAX PO Take by mouth daily.   multivitamin per tablet Take 1 tablet by mouth daily.   nystatin powder Generic drug:  nystatin Apply 1 g topically 2 (two) times daily as needed (rash).   nystatin cream Commonly known as:  MYCOSTATIN Apply 1 application topically 2 (two) times daily.   PA B-COMPLEX WITH B-12 Tabs Take 1 tablet by mouth daily.   ranitidine 150 MG tablet Commonly known as:  ZANTAC Take 150 mg by mouth 2 (two) times daily.   simvastatin 10 MG tablet Commonly known as:  ZOCOR Take 1 tablet (10 mg total) by mouth at bedtime.   STOOL SOFTENER 100 MG capsule Generic drug:  docusate sodium Take 100 mg by mouth daily as needed for mild constipation.   TYLENOL PO Take 1 tablet by mouth as needed (PAIN).        DISCHARGE INSTRUCTIONS:  See AVS. If you experience worsening of your admission symptoms, develop shortness of breath, life threatening emergency, suicidal or homicidal thoughts you must seek medical attention immediately by calling 911 or calling your MD immediately  if symptoms less severe.  You Must read complete instructions/literature along with all the possible adverse reactions/side effects for all the Medicines you take and that have been prescribed to you. Take any new Medicines after you have completely understood and accpet all the possible adverse reactions/side effects.   Please note  You were cared for  by a hospitalist during your hospital stay. If you have any questions about your discharge medications or the care you received while you were in the hospital after you are discharged, you can call the unit and asked to speak with the hospitalist on call if the hospitalist that took care of you is not available. Once you are discharged, your primary care physician will handle any further medical issues. Please note that NO REFILLS for any discharge medications will be authorized once you are discharged, as it is imperative that you return to your primary care physician (or establish a relationship with a primary care physician if you do not have one) for your aftercare needs so that they can reassess your need for medications and monitor your lab values.    On the day of Discharge:  VITAL SIGNS:  Blood pressure 123/79, pulse 62, temperature 98.3 F (36.8 C), temperature source Oral, resp. rate 18, height 5\' 4"  (1.626 m), weight 58.1 kg, SpO2 97 %. PHYSICAL EXAMINATION:  GENERAL:  84  y.o.-year-old patient lying in the bed with no acute distress.  EYES: Pupils equal, round, reactive to light and accommodation. No scleral icterus. Extraocular muscles intact.  HEENT: Head atraumatic, normocephalic. Oropharynx and nasopharynx clear.  NECK:  Supple, no jugular venous distention. No thyroid enlargement, no tenderness.  LUNGS: Normal breath sounds bilaterally, no wheezing, rales,rhonchi or crepitation. No use of accessory muscles of respiration.  CARDIOVASCULAR: S1, S2 normal. No murmurs, rubs, or gallops.  ABDOMEN: Soft, non-tender, non-distended. Bowel sounds present. No organomegaly or mass.  EXTREMITIES: No pedal edema, cyanosis, or clubbing.  NEUROLOGIC: Cranial nerves II through XII are intact. Muscle strength 5/5 in all extremities. Sensation intact. Gait not checked.  PSYCHIATRIC: The patient is alert and oriented x 3.  SKIN: No obvious rash, lesion, or ulcer.  DATA REVIEW:   CBC Recent Labs    Lab 08/23/18 0643  WBC 7.9  HGB 12.1  HCT 36.0  PLT 178    Chemistries  Recent Labs  Lab 08/23/18 0643 08/24/18 0119  NA 137 140  K 4.3 4.2  CL 106 109  CO2 22 24  GLUCOSE 118* 117*  BUN 36* 31*  CREATININE 1.54* 1.40*  CALCIUM 9.3 9.6  MG 2.7*  --   AST 167*  --   ALT 154*  --   ALKPHOS 79  --   BILITOT 0.7  --      Microbiology Results  Results for orders placed or performed in visit on 07/20/16  CULTURE, URINE COMPREHENSIVE     Status: None   Collection Time: 07/20/16 12:27 PM  Result Value Ref Range Status   Culture ENTEROCOCCUS SPECIES LACTOBACILLUS SPECIES   Final   Colony Count 1,000-10,000 CFU/mL  Final   Organism ID, Bacteria ENTEROCOCCUS SPECIES  Final    Comment: May represent colonizers from external and internal genitalia.No further testing(including susceptibility)will be performed.    Colony Count 10,000-50,000 CFU/mL  Final   Organism ID, Bacteria LACTOBACILLUS SPECIES  Final      Susceptibility   Enterococcus species -  (no method available)    AMPICILLIN <=2 Sensitive     LEVOFLOXACIN 1 Sensitive     NITROFURANTOIN <=16 Sensitive     VANCOMYCIN 1 Sensitive     TETRACYCLINE >=16 Resistant     RADIOLOGY:  No results found.   Management plans discussed with the patient, family and they are in agreement.  CODE STATUS: Full Code   TOTAL TIME TAKING CARE OF THIS PATIENT: 36 minutes.    Demetrios Loll M.D on 08/24/2018 at 3:35 PM  Between 7am to 6pm - Pager - [phone]  After 6pm go to [email or web address] - Proofreader  [facility]  Office  [phone]  CC: Primary care physician; Einar Pheasant, MD   Note: This dictation was prepared with Dragon dictation along with smaller phrase technology. Any transcriptional errors that result from this process are unintentional.

## 2018-08-24 NOTE — Telephone Encounter (Signed)
Will patient need TCM ?

## 2018-08-24 NOTE — Anesthesia Postprocedure Evaluation (Signed)
Anesthesia Post Note  Patient: Jill Castro  Procedure(Castro) Performed: CARDIOVERSION (N/A )  Patient location during evaluation: Cath Lab Anesthesia Type: General Level of consciousness: awake and alert Pain management: pain level controlled Vital Signs Assessment: post-procedure vital signs reviewed and stable Respiratory status: spontaneous breathing, nonlabored ventilation, respiratory function stable and patient connected to nasal cannula oxygen Cardiovascular status: blood pressure returned to baseline and stable Postop Assessment: no apparent nausea or vomiting Anesthetic complications: no     Last Vitals:  Vitals:   08/24/18 0815 08/24/18 0830  BP: 126/77 130/77  Pulse: 62 (!) 58  Resp: 16 20  Temp:    SpO2: 98% 98%    Last Pain:  Vitals:   08/24/18 0713  TempSrc: Oral  PainSc:                  Jill Castro

## 2018-08-24 NOTE — Progress Notes (Signed)
Orders to give PO amiodarone/ keep amio gtt infusing at 33.6mls/hr until  13:00/ will have PT ambulate pt/ if tolerates well - OK to discharge per cardio

## 2018-08-24 NOTE — Plan of Care (Signed)
  Problem: Activity: Goal: Risk for activity intolerance will decrease Outcome: Not Progressing Note:  Heart rate sustains in 120-130's when up with activity

## 2018-08-24 NOTE — Progress Notes (Signed)
Discharge instructions explained to pt / verbalized an understanding/ iv and tele removed/ ambulated with PT/ tolerated well/  Pt remains in NSR with HR of 67/ RX given to pt/ transported off unit via wheelchair.

## 2018-08-24 NOTE — Discharge Instructions (Addendum)
Information on my medicine - ELIQUIS (apixaban)  Why was Eliquis prescribed for you? Eliquis was prescribed for you to reduce the risk of a blood clot forming that can cause a stroke if you have a medical condition called atrial fibrillation (a type of irregular heartbeat).  What do You need to know about Eliquis ? Take your Eliquis TWICE DAILY - one tablet in the morning and one tablet in the evening with or without food. If you have difficulty swallowing the tablet whole please discuss with your pharmacist how to take the medication safely.  Take Eliquis exactly as prescribed by your doctor and DO NOT stop taking Eliquis without talking to the doctor who prescribed the medication.  Stopping may increase your risk of developing a stroke.  Refill your prescription before you run out.   Home health. After discharge, you should have regular check-up appointments with your healthcare provider that is prescribing your Eliquis.  In the future your dose may need to be changed if your kidney function or weight changes by a significant amount or as you get older.  What do you do if you miss a dose? If you miss a dose, take it as soon as you remember on the same day and resume taking twice daily.  Do not take more than one dose of ELIQUIS at the same time to make up a missed dose.  Important Safety Information A possible side effect of Eliquis is bleeding. You should call your healthcare provider right away if you experience any of the following: ? Bleeding from an injury or your nose that does not stop. ? Unusual colored urine (red or dark brown) or unusual colored stools (red or black). ? Unusual bruising for unknown reasons. ? A serious fall or if you hit your head (even if there is no bleeding).  Some medicines may interact with Eliquis and might increase your risk of bleeding or clotting while on Eliquis. To help avoid this, consult your healthcare provider or pharmacist prior to using  any new prescription or non-prescription medications, including herbals, vitamins, non-steroidal anti-inflammatory drugs (NSAIDs) and supplements.  This website has more information on Eliquis (apixaban): http://www.eliquis.com/eliquis/home HHPT.

## 2018-08-24 NOTE — CV Procedure (Signed)
Cardioversion procedure note For atrial fibrillation, persistent  Procedure Details:  Consent: Risks of procedure as well as the alternatives and risks of each were explained to the (patient/caregiver). Consent for procedure obtained.  Time Out: Verified patient identification, verified procedure, site/side was marked, verified correct patient position, special equipment/implants available, medications/allergies/relevent history reviewed, required imaging and test results available. Performed  Patient placed on cardiac monitor, pulse oximetry, supplemental oxygen as necessary.  Sedation given: propofol IV, Dr. Marcello Moores Pacer pads placed anterior and posterior chest.   Cardioverted 1 time(s).  Cardioverted at  120 J. Synchronized biphasic Converted to NSR   Evaluation: Findings: Post procedure EKG shows: NSR Complications: None Patient did tolerate procedure well.  Time Spent Directly with the Patient:  61 minutes   Esmond Plants, M.D., Ph.D.

## 2018-08-24 NOTE — Care Management Note (Signed)
Case Management Note  Patient Details  Name: Jill Castro MRN: [id number] Date of Birth: 27-Feb-1927  Subjective/Objective:     Patient admitted with A-Fib.  Chronic Eliquis.  Lives alone at [facility].  No difficulties obtaining medications or with transportation.  No current services in the home.  Pending PT consult.  Currently on room air.  Recommended home health RN to assist patient with medications and BP checks.  Some medications are going to be adjusted.  Patient says she would be open to having a nurse visit.    Patient does not have a scale at home but states she is able to obtain one without difficulty.  Current with PCP.            Action/Plan: Referral made to [facility] after patient given choice.      Expected Discharge Date:  08/24/18               Expected Discharge Plan:  Portal  In-House Referral:     Discharge planning Services  CM Consult  Post Acute Care Choice:    Choice offered to:  Patient  DME Arranged:    DME Agency:     HH Arranged:  RN [place] Agency:  [facility]  Status of Service:  In process, will continue to follow  If discussed at Long Length of Stay Meetings, dates discussed:    Additional Comments:  Elza Rafter, RN 08/24/2018, 10:45 AM

## 2018-08-24 NOTE — Anesthesia Post-op Follow-up Note (Signed)
Anesthesia QCDR form completed.        

## 2018-08-24 NOTE — Telephone Encounter (Signed)
No she had a TCM still can call see how patient is feeling but she had HFU on 08/10/18. Dr. Nicki Reaper will probably say follow up with cardiology.

## 2018-08-24 NOTE — Evaluation (Signed)
Physical Therapy Evaluation Patient Details Name: Jill Castro MRN: [id number] DOB: Jan 09, 1927 Today's Date: 08/24/2018   History of Present Illness  Pt is a 84 y/o F who presented with recurrent a-fib/flutter, SOB, anxiety.  Pt is s/p cardioversion on 9/27.  Pt's PMH includes CHF, glaucoma, macular degeneration, osteoporosis, uterine cancer.     Clinical Impression  Pt admitted with above diagnosis. Pt currently with functional limitations due to the deficits listed below (see PT Problem List). Ms. Wojnar appears near her baseline level of mobility.  She ambulated 30 ft without AD and 30 ft with RW with improved stability.  Instructed pt to use rollator at all times upon d/c until follow up with HHPT. HR up to low 70s when ambulating from low 60s at rest. Pt presents with generalized BUE and BLE weakness.  Pt will benefit from skilled PT to increase their independence and safety with mobility to allow discharge to the venue listed below.      Follow Up Recommendations Home health PT    Equipment Recommendations  None recommended by PT    Recommendations for Other Services       Precautions / Restrictions Precautions Precautions: Fall;Other (comment) Precaution Comments: monitor HR Restrictions Weight Bearing Restrictions: No      Mobility  Bed Mobility Overal bed mobility: Modified Independent             General bed mobility comments: Slightly increased time but no physical assist or cues needed  Transfers Overall transfer level: Needs assistance Equipment used: Rolling walker (2 wheeled);None Transfers: Sit to/from Stand Sit to Stand: Supervision         General transfer comment: Pt stood from bed without AD and again with RW, no instability noted either attempt.  Supervision for safety.   Ambulation/Gait Ambulation/Gait assistance: Min guard Gait Distance (Feet): 60 Feet(30x2) Assistive device: Rolling walker (2 wheeled);None Gait Pattern/deviations:  Decreased step length - right;Decreased step length - left;Trunk flexed     General Gait Details: Dec step length Bil and pt with flexed posture due to scoliosis.  Pt ambulated 30 ft without AD with mild instability but no LOB, then ambulated 30 ft with RW with improved stability.  HR up to low 70s with activity from low 60s at rest.   Stairs            Wheelchair Mobility    Modified Rankin (Stroke Patients Only)       Balance Overall balance assessment: Needs assistance Sitting-balance support: No upper extremity supported;Feet supported Sitting balance-Leahy Scale: Good     Standing balance support: No upper extremity supported;During functional activity Standing balance-Leahy Scale: Fair Standing balance comment: Pt able to stand statically without UE support but would likely lose her balance with perturbation                             Pertinent Vitals/Pain Pain Assessment: Faces Faces Pain Scale: Hurts a little bit Pain Location: IV site on dorsal R hand with wrist E Pain Descriptors / Indicators: Discomfort Pain Intervention(s): Limited activity within patient's tolerance;Monitored during session    Home Living Family/patient expects to be discharged to:: Private residence Living Arrangements: Alone Available Help at Discharge: Friend(s);Available PRN/intermittently Type of Home: Independent living facility Home Access: Level entry     Home Layout: One level Home Equipment: Grab bars - tub/shower(3 wheeled walker without seat) Additional Comments: Pt reports that home is handicap accessible  Prior Function Level of Independence: Independent with assistive device(s)         Comments: Pt ambulates without AD at times in her home.  Otherwise uses her 3 wheeled walker.  Was ambulating at ILF as well as would go out to restaurants and have no difficulty ambulating shorter distances in community.  No falls in the past 6 months.  Showers and  dresses independently.      Hand Dominance        Extremity/Trunk Assessment   Upper Extremity Assessment Upper Extremity Assessment: Generalized weakness    Lower Extremity Assessment Lower Extremity Assessment: Generalized weakness    Cervical / Trunk Assessment Cervical / Trunk Assessment: Other exceptions Cervical / Trunk Exceptions: significant scoliosis resulting in forward flexed posture  Communication   Communication: HOH  Cognition Arousal/Alertness: Awake/alert Behavior During Therapy: WFL for tasks assessed/performed Overall Cognitive Status: Within Functional Limits for tasks assessed                                        General Comments General comments (skin integrity, edema, etc.): Instructed pt to use rollator at all times upon d/c to home for safety until she begins HHPT who will then direct her    Exercises     Assessment/Plan    PT Assessment Patient needs continued PT services  PT Problem List Decreased strength;Decreased activity tolerance;Decreased balance       PT Treatment Interventions DME instruction;Gait training;Functional mobility training;Therapeutic activities;Therapeutic exercise;Balance training;Neuromuscular re-education;Patient/family education    PT Goals (Current goals can be found in the Care Plan section)  Acute Rehab PT Goals Patient Stated Goal: to return home to PLOF PT Goal Formulation: With patient Time For Goal Achievement: 09/07/18 Potential to Achieve Goals: Good    Frequency Min 2X/week   Barriers to discharge        Co-evaluation               AM-PAC PT "6 Clicks" Daily Activity  Outcome Measure Difficulty turning over in bed (including adjusting bedclothes, sheets and blankets)?: None Difficulty moving from lying on back to sitting on the side of the bed? : None Difficulty sitting down on and standing up from a chair with arms (e.g., wheelchair, bedside commode, etc,.)?: A  Little Help needed moving to and from a bed to chair (including a wheelchair)?: A Little Help needed walking in hospital room?: A Little Help needed climbing 3-5 steps with a railing? : A Lot 6 Click Score: 19    End of Session Equipment Utilized During Treatment: Gait belt Activity Tolerance: Patient tolerated treatment well Patient left: in chair;with call bell/phone within reach;with chair alarm set;with family/visitor present Nurse Communication: Mobility status;Other (comment)(HR) PT Visit Diagnosis: Unsteadiness on feet (R26.81);Muscle weakness (generalized) (M62.81)    Time: 0045-9977 PT Time Calculation (min) (ACUTE ONLY): 29 min   Charges:   PT Evaluation $PT Eval Moderate Complexity: 1 Mod PT Treatments $Gait Training: 8-22 mins        Collie Siad PT, DPT 08/24/2018, 3:48 PM

## 2018-08-24 NOTE — Progress Notes (Signed)
Progress Note  Patient Name: Jill Castro Date of Encounter: 08/24/2018  Primary Cardiologist: Rockey Situ  Subjective   Successful cardioversion this morning Normal sinus rhythm Still on amiodarone infusion, increased up to 1 mg/min Currently heart rates in the 60s  Inpatient Medications    Scheduled Meds: . apixaban  2.5 mg Oral BID  . famotidine  20 mg Oral Daily  . magnesium oxide  400 mg Oral Daily  . metoprolol succinate  50 mg Oral Daily  . simvastatin  10 mg Oral QHS  . sodium chloride flush  3 mL Intravenous Q12H   Continuous Infusions: . sodium chloride    . amiodarone 30 mg/hr (08/24/18 0527)   PRN Meds: sodium chloride, acetaminophen, ondansetron (ZOFRAN) IV, sodium chloride flush   Vital Signs    Vitals:   08/24/18 0746 08/24/18 0815 08/24/18 0830 08/24/18 0844  BP: 94/60 126/77 130/77 123/79  Pulse: (!) 58 62 (!) 58 62  Resp: 16 16 20 18   Temp:      TempSrc:      SpO2: 98% 98% 98% 97%  Weight:      Height:        Intake/Output Summary (Last 24 hours) at 08/24/2018 1013 Last data filed at 08/24/2018 0530 Gross per 24 hour  Intake 309.46 ml  Output 1300 ml  Net -990.54 ml   Filed Weights   08/23/18 0633 08/23/18 1320 08/24/18 0713  Weight: 59 kg 57 kg 58.1 kg    Telemetry    NSR after cardioversion - Personally Reviewed  ECG    - Personally Reviewed  Physical Exam   GEN: No acute distress.  Frail, thin, anxious Neck: No JVD Cardiac: RRR, no murmurs, rubs, or gallops.  Respiratory: Clear to auscultation bilaterally. GI: Soft, nontender, non-distended  MS: No edema; No deformity. Neuro:  Nonfocal  Psych: Normal affect   Labs    Chemistry Recent Labs  Lab 08/23/18 0643 08/24/18 0119  NA 137 140  K 4.3 4.2  CL 106 109  CO2 22 24  GLUCOSE 118* 117*  BUN 36* 31*  CREATININE 1.54* 1.40*  CALCIUM 9.3 9.6  PROT 6.9  --   ALBUMIN 3.7  --   AST 167*  --   ALT 154*  --   ALKPHOS 79  --   BILITOT 0.7  --   GFRNONAA 29*  32*  GFRAA 33* 37*  ANIONGAP 9 7     Hematology Recent Labs  Lab 08/23/18 0643  WBC 7.9  RBC 3.81  HGB 12.1  HCT 36.0  MCV 94.4  MCH 31.8  MCHC 33.7  RDW 14.1  PLT 178    Cardiac Enzymes Recent Labs  Lab 08/23/18 0643 08/23/18 1352 08/23/18 1907 08/24/18 0119  TROPONINI 0.03* 0.03* 0.03* 0.03*   No results for input(s): TROPIPOC in the last 168 hours.   BNP Recent Labs  Lab 08/23/18 0643  BNP 712.0*     DDimer No results for input(s): DDIMER in the last 168 hours.   Radiology    Dg Chest Port 1 View  Result Date: 08/23/2018 CLINICAL DATA:  AFib with rapid response EXAM: PORTABLE CHEST 1 VIEW COMPARISON:  08/12/2017 FINDINGS: Chronic cardiomegaly with aortic tortuosity. Lower mediastinal mass from hiatal hernia based on prior. Large lung volumes with mild diaphragm flattening. There is no edema, consolidation, effusion, or pneumothorax. Hazy increased density at the medial right apex is attributed to technique. IMPRESSION: Chronic cardiomegaly.  Negative for failure. Electronically Signed   By: Angelica Chessman  Watts M.D.   On: 08/23/2018 08:38    Cardiac Studies   Transesophageal echo August 06, 2018 Left ventricle: Hypertrophy was noted. Systolic function was   normal. The estimated ejection fraction was in the range of 55%   to 60%. No evidence of thrombus. - Aortic valve: No evidence of vegetation. - Mitral valve: No evidence of vegetation. There was mild   regurgitation. - Left atrium: The atrium was dilated. No evidence of thrombus in   the atrial cavity or appendage. - Right atrium: The atrium was dilated. No evidence of thrombus in   the atrial cavity or appendage. - Atrial septum: No defect or patent foramen ovale was identified. - Tricuspid valve: No evidence of vegetation. - Pulmonic valve: No evidence of vegetation.   Patient Profile     Jill Castro is a 84 y.o. female with a hx of chronic diastolic CHF, history of atrial  fibrillation/flutter with recent transesophageal echo and cardioversion while inpatient 08/06/2018, presenting with recurrent atrial fibrillation/flutter rate 130 -140 bpm shortness of breath, anxiety  Assessment & Plan     atrial fibrillation/ flutter with RVR Successful cardioversion this morning Would continue anticoagulation with Elliquis 2.5 BID Metoprolol succinate 50 mg daily Amiodarone, 400 milligrams twice daily for 5 days then down to 200 twice daily Reassurance provided  Hypercholesterolemia continue low-dose simvastatin Stable  DIASTOLIC HEART FAILURE, CHRONIC We will need to monitor closely for shortness of breath Has not been eating or drinking much, appears euvolemic  Essential hypertension metoprolol succinate 50 daily Other medications on hold  Edema, unspecified type Consistent with venous insufficiency, continue compression hose and leg elevation  Chronic kidney disease (CKD), stage III (moderate) Lasix 3-4 times per week  Visual hallucinations Etiology of her visual hallucinations is not clear Seem to happen after loss of her husband Suspect neurologic issue  Dispo: Lives at [facility], frail, protein calorie malnutrition Need to supplement with boost/Ensure Possible component of depression, adjustment disorder since losing her husband Case discussed with case management.  They will arrange nursing Should be acceptable  to go home later today   Total encounter time more than 25 minutes  Greater than 50% was spent in counseling and coordination of care with the patient   For questions or updates, please contact [place] Please consult [email or web address] for contact info under        Signed, Ida Rogue, MD  08/24/2018, 10:13 AM

## 2018-08-24 NOTE — Telephone Encounter (Signed)
Copied from [place] [id number]. Topic: Appointment Scheduling - Scheduling Inquiry for Clinic >> Aug 24, 2018 10:27 AM Sheran Luz wrote: Reason for CRM: Almyra Free from [facility] called to schedule pt a hosp fu with Dr. Nicki Reaper a week from today 9/27 but there is no availability. Almyra Free would like office to call pt to schedule. Please advise.

## 2018-08-24 NOTE — Anesthesia Preprocedure Evaluation (Signed)
Anesthesia Evaluation  Patient identified by MRN, date of birth, ID band Patient awake    Reviewed: Allergy & Precautions, NPO status , Patient's Chart, lab work & pertinent test results, reviewed documented beta blocker date and time   Airway Mallampati: II  TM Distance: >3 FB     Dental  (+) Chipped   Pulmonary shortness of breath,           Cardiovascular hypertension, Pt. on medications and Pt. on home beta blockers +CHF and + DOE  + dysrhythmias Atrial Fibrillation      Neuro/Psych PSYCHIATRIC DISORDERS Depression    GI/Hepatic GERD  Controlled,  Endo/Other    Renal/GU Renal disease     Musculoskeletal   Abdominal   Peds  Hematology   Anesthesia Other Findings   Reproductive/Obstetrics                             Anesthesia Physical Anesthesia Plan  ASA: III  Anesthesia Plan: General   Post-op Pain Management:    Induction: Intravenous  PONV Risk Score and Plan:   Airway Management Planned:   Additional Equipment:   Intra-op Plan:   Post-operative Plan:   Informed Consent: I have reviewed the patients History and Physical, chart, labs and discussed the procedure including the risks, benefits and alternatives for the proposed anesthesia with the patient or authorized representative who has indicated his/her understanding and acceptance.     Plan Discussed with: CRNA  Anesthesia Plan Comments:         Anesthesia Quick Evaluation

## 2018-08-27 NOTE — Progress Notes (Signed)
Cardiology Office Note  Date:  08/29/2018   ID:  Jill Castro, DOB 01-May-1927, MRN [id number]  PCP:  Einar Pheasant, MD   Chief Complaint  Patient presents with  . other    F/u aflutter/post TEE c/o elevated BP and new medication causing nausea and making pt feel washed out. Meds reviewed verbally with pt.    HPI:  Jill Castro is an 84 yo pleasant woman with history of  HTN  diastolic CHF,  chronic lower extremity edema (left leg in particular), severe scoliosis and chronic back pain and requires pain medication.  Husband passed from pancreatic cancer presents for followup of her hypertension, atrial fibrillation and diastolic CHF   Two recent hospitalizations in September 2019 for atrial fib/flutter requiring cardiobersion (first admission with TEE) Second admission started on amiodarone with cardioversion  In follow-up today she reports that she feels relatively well but does have some nausea, fatigue.  Slow to recover Presents today with a friend  Denies any tachycardia concerning for recurrent atrial fibrillation Using her walker Chronic back pain  L[facility] at [facility] alone, husband  passed away last year Chronic lower extremity swelling left leg greater than right Using her compression hose  Chronic hallucinations still an issue, at night when she wakes up Macular degeneration in the right eye, occlusion on the left Has had injections Depressed or loss of her husband  Son lives in [place], daughter in [place]  EKG personally reviewed by myself on todays visit Shows sinus bradycardia rate 46 bpm no significant ST or T wave changes Previous EKG showing atrial fibrillation/flutter  Other past medical history Chronic back pain, not a candidate for back surgery Continued eye problem, macular degeneration. Baseline Creatinine 1.4 Tries to wear compression hose when she can.  catheterization in 2003 showing luminal irregularities, negative stress  test in October 2010 with no ischemia, echocardiogram August 2010 showing normal systolic function, mild to moderate tricuspid regurgitation, mildly elevated right ventricular systolic pressures consistent with mild pulmonary hypertension, ejection fraction 60%.  PMH:   has a past medical history of Sherran Needs syndrome, Diastolic CHF, acute ([place]), Dizziness, GERD (gastroesophageal reflux disease), Glaucoma, HTN (hypertension), Hypercalcemia, Hypercholesterolemia, Hyperkalemia, Macular degeneration, Osteoporosis, Renal fibromuscular dysplasia ([place]), Renal insufficiency, S/P cardiac catheterization (05/2002), Scoliosis associated with other condition, Uterine cancer ([place]), and Wears hearing aid.  PSH:    Past Surgical History:  Procedure Laterality Date  . AQUEOUS SHUNT Left 01/30/2017   Procedure: AQUEOUS SHUNT ahmed tube;  Surgeon: Ronnell Freshwater, MD;  Location: [place];  Service: Ophthalmology;  Laterality: Left;  ahmed tube shunt with scleral patch graft  . CARDIAC CATHETERIZATION     Left  . CARDIOVERSION N/A 08/06/2018   Procedure: CARDIOVERSION;  Surgeon: Wellington Hampshire, MD;  Location: [facility];  Service: Cardiovascular;  Laterality: N/A;  . CARDIOVERSION N/A 08/24/2018   Procedure: CARDIOVERSION;  Surgeon: Minna Merritts, MD;  Location: [facility];  Service: Cardiovascular;  Laterality: N/A;  . CATARACT EXTRACTION Right 07/03/2013   [facility] Dr Wallace Going  . CATARACT EXTRACTION W/PHACO Left 01/30/2017   Procedure: CATARACT EXTRACTION PHACO AND INTRAOCULAR LENS PLACEMENT ([place])  left;  Surgeon: Ronnell Freshwater, MD;  Location: [place];  Service: Ophthalmology;  Laterality: Left;  . REFRACTIVE SURGERY    . TEE WITHOUT CARDIOVERSION N/A 08/06/2018   Procedure: TRANSESOPHAGEAL ECHOCARDIOGRAM (TEE);  Surgeon: Wellington Hampshire, MD;  Location: [facility];  Service: Cardiovascular;  Laterality: N/A;  . TOTAL ABDOMINAL HYSTERECTOMY W/ BILATERAL  SALPINGOOPHORECTOMY     stage I C well differentiated adenocarcinoma    Current Outpatient Medications  Medication Sig Dispense Refill  . Acetaminophen (TYLENOL PO) Take 1 tablet by mouth as needed (PAIN).     Marland Kitchen amiodarone (PACERONE) 200 MG tablet 400 mg po bid for 5 days, then 200 mg po bid. (Patient taking differently: 200 mg 2 (two) times daily. ) 30 tablet 1  . apixaban (ELIQUIS) 2.5 MG TABS tablet Take 1 tablet (2.5 mg total) by mouth 2 (two) times daily. 60 tablet 0  . B Complex Vitamins (PA B-COMPLEX WITH B-12) TABS Take 1 tablet by mouth daily.      . Calcium Carbonate-Vitamin D (CALCIUM-VITAMIN D) 600-200 MG-UNIT CAPS Take 0.5 mg by mouth daily. Takes 1/2 tablet daily.     Marland Kitchen docusate sodium (STOOL SOFTENER) 100 MG capsule Take 100 mg by mouth daily as needed for mild constipation.    . Glucosamine-Chondroitin 1500-1200 MG/30ML LIQD Take 0.5 tablets by mouth daily. Takes 1/2 tablet daily.    . Magnesium 250 MG TABS Take 250 mg by mouth daily.    . metoprolol succinate (TOPROL-XL) 50 MG 24 hr tablet Take 1 tablet (50 mg total) by mouth daily. Take with or immediately following a meal. 30 tablet 0  . multivitamin (THERAGRAN) per tablet Take 1 tablet by mouth daily.      Marland Kitchen nystatin (NYSTATIN) powder Apply 1 g topically 2 (two) times daily as needed (rash).    . Polyethylene Glycol 3350 (MIRALAX PO) Take by mouth daily.     . ranitidine (ZANTAC) 150 MG tablet Take 150 mg by mouth 2 (two) times daily.     . simvastatin (ZOCOR) 10 MG tablet Take 1 tablet (10 mg total) by mouth at bedtime. 90 tablet 3   No current facility-administered medications for this visit.      Allergies:   Clonidine; Benicar [olmesartan]; Celebrex [celecoxib]; Iodine; and Penicillins   Social History:  The patient  reports that she has never smoked. She has never used smokeless tobacco. She reports that she does not drink alcohol or use drugs.   Family History:   family history includes Diabetes in her cousin;  Heart disease in her father and mother; Parkinson's disease in her mother.    Review of Systems: Review of Systems  Constitutional: Positive for malaise/fatigue.  Respiratory: Negative.   Cardiovascular: Positive for leg swelling.  Gastrointestinal: Negative.   Musculoskeletal: Negative.   Neurological: Negative.   Psychiatric/Behavioral: Negative.   All other systems reviewed and are negative.    PHYSICAL EXAM: VS:  BP (!) 168/100 (BP Location: Left Arm, Patient Position: Sitting, Cuff Size: Normal)   Pulse (!) 46   Ht 5\' 4"  (1.626 m)   Wt 129 lb 12 oz (58.9 kg)   BMI 22.27 kg/m  , BMI Body mass index is 22.27 kg/m.   Constitutional: frail,  kyphosis, unsteady gait, presenting with a walker HENT:  Head: Normocephalic and atraumatic.  Eyes:  no discharge. No scleral icterus.  Neck: Normal range of motion. Neck supple. No JVD present.  Cardiovascular: Regular rhythm, bradycardic,  exam reveals no gallop and no friction rub.  Trace pitting edema compression hose in place No murmur heard. Pulmonary/Chest: decreased inspiratory effort,  breath sounds normal. No stridor. No respiratory distress.  no wheezes.  no rales.  no tenderness.  Abdominal: Soft.  no distension.  no tenderness.  Musculoskeletal: Normal range of motion.  no  tenderness or deformity.  Neurological:  normal  muscle tone. Coordination normal. No atrophy Skin: Skin is warm and dry. No rash noted. not diaphoretic.  Psychiatric:  normal mood and affect. behavior is normal. Thought content normal.   Recent Labs: 08/06/2018: TSH 3.946 08/23/2018: ALT 154; B Natriuretic Peptide 712.0; Hemoglobin 12.1; Magnesium 2.7; Platelets 178 08/24/2018: BUN 31; Creatinine, Ser 1.40; Potassium 4.2; Sodium 140    Lipid Panel Lab Results  Component Value Date   CHOL 129 08/24/2018   HDL 58 08/24/2018   LDLCALC 57 08/24/2018   TRIG 68 08/24/2018      Wt Readings from Last 3 Encounters:  08/29/18 129 lb 12 oz (58.9 kg)   08/24/18 128 lb (58.1 kg)  08/10/18 127 lb (57.6 kg)     ASSESSMENT AND PLAN:  Atrial fibrillation/atrial flutter Cardioversion x2 in the past several weeks Maintaining sinus rhythm, bradycardic today Recommend she decrease metoprolol succinate down to 25 mg daily Today is the first day of amiodarone 200 twice daily down from 400 twice daily Continue Elliquis 2.5 BID  Hypercholesterolemia continue low-dose simvastatin Stable  DIASTOLIC HEART FAILURE, CHRONIC Appears relatively euvolemic on today's visit  Essential hypertension Blood pressure elevated after losartan and hydralazine held on prior hospital visit 3 weeks ago Recommended she restart losartan 100 mg daily Stay on metoprolol succinate 25 daily Monitor blood pressure in the next week or so and call our office with numbers If blood pressure continues to run high may need to restart hydralazine but at lower dose 25 twice daily or 3 times daily  Edema, unspecified type Consistent with venous insufficiency,  continue compression hose and leg elevation Stable  Chronic kidney disease (CKD), stage III (moderate) Reports she has follow-up with Dr. Candiss Norse in the next several weeks  Visual hallucinations Etiology of her visual hallucinations is not clear Seem to happen after loss of her husband Suspect neurologic issue  Follow-up 2 months   Total encounter time more than 45 minutes  Greater than 50% was spent in counseling and coordination of care with the patient    Orders Placed This Encounter  Procedures  . EKG 12-Lead     Signed, Esmond Plants, M.D., Ph.D. 08/29/2018  [place], [place]

## 2018-08-28 NOTE — Telephone Encounter (Signed)
Advised patient to follow up with cardiology since she was just here for a visit. Patient sees cardiology tomorrow. Advised she could let me know if she needs anything.

## 2018-08-29 ENCOUNTER — Encounter: Payer: Self-pay | Admitting: Cardiovascular Disease

## 2018-08-29 ENCOUNTER — Ambulatory Visit (INDEPENDENT_AMBULATORY_CARE_PROVIDER_SITE_OTHER): Payer: Medicare Other | Admitting: Cardiovascular Disease

## 2018-08-29 VITALS — BP 168/100 | HR 46 | Ht 64.0 in | Wt 129.8 lb

## 2018-08-29 DIAGNOSIS — I483 Typical atrial flutter: Secondary | ICD-10-CM

## 2018-08-29 DIAGNOSIS — I10 Essential (primary) hypertension: Secondary | ICD-10-CM

## 2018-08-29 DIAGNOSIS — R0609 Other forms of dyspnea: Secondary | ICD-10-CM

## 2018-08-29 DIAGNOSIS — I5032 Chronic diastolic (congestive) heart failure: Secondary | ICD-10-CM

## 2018-08-29 DIAGNOSIS — R600 Localized edema: Secondary | ICD-10-CM

## 2018-08-29 DIAGNOSIS — E7800 Pure hypercholesterolemia, unspecified: Secondary | ICD-10-CM

## 2018-08-29 DIAGNOSIS — N183 Chronic kidney disease, stage 3 unspecified: Secondary | ICD-10-CM

## 2018-08-29 MED ORDER — LOSARTAN POTASSIUM 100 MG PO TABS: 100.0000 mg | ORAL_TABLET | Freq: Every day | ORAL | 3 refills | Status: AC

## 2018-08-29 MED ORDER — METOPROLOL TARTRATE 25 MG PO TABS: 25.0000 mg | ORAL_TABLET | Freq: Two times a day (BID) | ORAL | 3 refills | Status: DC

## 2018-08-29 MED ORDER — AMIODARONE HCL 200 MG PO TABS: 200.0000 mg | ORAL_TABLET | Freq: Every day | ORAL | 3 refills | Status: AC

## 2018-08-29 NOTE — Patient Instructions (Addendum)
Drink fluids   Medication Instructions:  Your physician has recommended you make the following change in your medication:   1) Please DECREASE the metoprolol down to 25 mg daily  2) Please RESTART losartan one a day  3) Continue amiodarone 200 mg twice a day for 1 week Then down to one a day  Please call with blood pressure and heart rate numbers in the next 2 weeks  Labwork:  No new labs needed  Testing/Procedures:  No further testing at this time   Follow-Up: It was a pleasure seeing you in the office today. Please call us if you have new issues that need to be addressed before your next appt.  [phone]  Your physician wants you to follow-up in: 2 months.  You will receive a reminder letter in the mail two months in advance. If you don't receive a letter, please call our office to schedule the follow-up appointment.  If you need a refill on your cardiac medications before your next appointment, please call your pharmacy.  For educational health videos Log in to : [email or web address] Or : [email or web address], password : [email or web address]

## 2018-08-31 ENCOUNTER — Telehealth: Payer: Self-pay | Admitting: Internal Medicine

## 2018-08-31 NOTE — Telephone Encounter (Signed)
Spoke with Lattie Haw & verbal okay given

## 2018-08-31 NOTE — Telephone Encounter (Signed)
Is this ok?

## 2018-08-31 NOTE — Telephone Encounter (Signed)
Copied from [place] [id number]. Topic: Quick Communication - See Telephone Encounter >> Aug 31, 2018 10:42 AM Gardiner Ramus wrote: CRM for notification. See Telephone encounter for: 08/31/18. Lattie Haw calling from [employer] to get permission to move OT evaluation to next week. Please advise [phone]

## 2018-09-05 ENCOUNTER — Telehealth: Payer: Self-pay | Admitting: Cardiovascular Disease

## 2018-09-05 ENCOUNTER — Other Ambulatory Visit: Payer: Self-pay | Admitting: Cardiovascular Disease

## 2018-09-05 MED ORDER — METOPROLOL SUCCINATE ER 25 MG PO TB24: 25.0000 mg | ORAL_TABLET | Freq: Every day | ORAL | 2 refills | Status: AC

## 2018-09-05 NOTE — Telephone Encounter (Signed)
S/w Mickel Baas from [facility].  As listed in previous entry, BP is running 160-170's/80's, HR upper 50's to 60's. She wanted to make Dr Rockey Situ aware. In reviewing medications, metoprolol tartrate and succinate were on her list.  In looking at Dr Donivan Scull AVS from 08/29/18, patient was to decrease metoprolol  (succinate) to 25 mg once a day. Updated med list. Advised I will route to Dr Rockey Situ for review.  He mentioned in OV note: Recommended she restart losartan 100 mg daily Stay on metoprolol succinate 25 daily Monitor blood pressure in the next week or so and call our office with numbers If blood pressure continues to run high may need to restart hydralazine but at lower dose 25 twice daily or 3 times daily

## 2018-09-05 NOTE — Telephone Encounter (Signed)
Jill Castro with [facility] home [facility] calling stating patient was seen last week  Was advised to call BP and HR   Pt c/o BP issue: STAT if pt c/o blurred vision, one-sided weakness or slurred speech  1. What are your last 5 BP readings?  09/05/18  174/86 HR 59 09/04/18 167/81 HR 60 09/03/18 161/76 HR 63 09/02/18 177/80 HR 53 09/01/18 168/82 HR 58   2. Are you having any other symptoms (ex. Dizziness, headache, blurred vision, passed out)? No to report   3. What is your BP issue?  Nurse calling needing to know if we are going to change around patient medications for she is working on [employer] and would like to know before filling them up for the week

## 2018-09-06 ENCOUNTER — Telehealth: Payer: Self-pay | Admitting: Internal Medicine

## 2018-09-06 MED ORDER — HYDRALAZINE HCL 50 MG PO TABS: 50.0000 mg | ORAL_TABLET | Freq: Three times a day (TID) | ORAL | 3 refills | Status: DC

## 2018-09-06 NOTE — Telephone Encounter (Signed)
I dont understand what she was taking Metoprolol tartrate and succinate? Doses?  Plan was for  restart losartan 100 mg daily metoprolol succinate 25 daily New change:  hydralazine, go back to 50 TID

## 2018-09-06 NOTE — Telephone Encounter (Signed)
Refill Request.  

## 2018-09-06 NOTE — Telephone Encounter (Signed)
Spoke with Mickel Baas with [facility].  She verbalized understanding to add hydralazine 50 mg TID and remain on other medications.  Rx sent to pharmacy. She will let the patient know and work on getting her pill box filled.

## 2018-09-06 NOTE — Telephone Encounter (Signed)
Copied from [place] [id number]. Topic: Quick Communication - Home Health Verbal Orders >> Sep 06, 2018  3:55 PM Bea Graff, NT wrote: Caller/Agency: [facility] Number: [phone] Requesting OT/PT/Skilled Nursing/Social Work: OT Frequency: 1 time a week for 5 weeks

## 2018-09-07 NOTE — Telephone Encounter (Signed)
ok 

## 2018-09-10 NOTE — Telephone Encounter (Signed)
Left returncall OK for Verbal orders [facility] nurse may advise.

## 2018-09-12 ENCOUNTER — Ambulatory Visit: Payer: Self-pay

## 2018-09-12 ENCOUNTER — Telehealth: Payer: Self-pay | Admitting: Cardiovascular Disease

## 2018-09-12 NOTE — Telephone Encounter (Signed)
rec'd call from [place], PTA from [facility].  Reported elevated BP readings today during her PT session.  See assess. below.  (readings 197/95-172/80)  Reported pt. denied any dizziness, headache, blurred vision, weakness of extremities, chest pain, or  shortness of breath.  Reviewed the doses of Losartan 100 mg qd, Metoprolol Succinate 25 mg qd, and Hydralazine 50mg  TID with the Physical Therapy Assistant, based on Dr. Donivan Scull office note of 08/29/18, and the telephone note of 09/06/18.  Medication record reviewed/reconciled at this time with PTA.  Per PTA, he noted that the medication record did not match the most recent medication changes.  It was noted that the pt. Had been taking Metoprolol Tartrate instead of Metoprolol Succinate.  Also, unsure if pt. Has been taking the Hydralazine 50 mg TID.  Mark, PTA, stated he will update the Medication record to reflect the correct doses of Losartan, Metoprolol Succinate, and Hydralazine.      BP rechecked per PTA at this time;  (L) arm 170/60 @ 12:44 PM.; pt. Sitting.  Advised the PTA to notify Dr. Donivan Scull office of the elevated BP readings today, and of medication record not matching most recent dose recommendations.  Advised will send this note to Dr. Nicki Reaper to make her aware of the above information. PTA verb. understanding and agreed to contact Dr. Donivan Scull office.                       Reason for Disposition . [9] Systolic BP  >= 147 OR Diastolic >= 829 AND [5] missed most recent dose of blood pressure medication    SBP readings have varied today with [facility] ; 197/95-170/60.  Cardiology has been managing pt's antihypertensives.  Encouraged [facility] PT Assistant to call Cardiology office; Dr. Ida Rogue, and make aware of elevated BP, and of discrepancy on medication record, in comparison to the most recent dose changes with Metoprolol Succinate and Hydralazine.  Answer Assessment - Initial Assessment Questions 1. BLOOD PRESSURE:  "What is the blood pressure?" "Did you take at least two measurements 5 minutes apart?" (L) arm:  194/83 @ 11:41 AM  197/95 @ 11:43 AM / sitting ; 173/86  @ 11:47 AM / standing ;  172/80  @ 12:14 PM / sitting;  170/60 @ 12:44 PM/ sitting 2. ONSET: "When did you take your blood pressure?"     See above 3. HOW: "How did you obtain the blood pressure?" (e.g., visiting nurse, automatic home BP monitor)    Digital machine per home PT Assistant 4. HISTORY: "Do you have a history of high blood pressure?"     yes 5. MEDICATIONS: "Are you taking any medications for blood pressure?" "Have you missed any doses recently?"    Yes has taken her BP medication this AM ; denied any missed doses  6. OTHER SYMPTOMS: "Do you have any symptoms?" (e.g., headache, chest pain, blurred vision, difficulty breathing, weakness)    Wt 128 # today (compared to 124 # on 10/9)  Denied dizziness, headache, blurred vision, shortness of breath, chest pain, or weakness of extremities 7. PREGNANCY: "Is there any chance you are pregnant?" "When was your last menstrual period?"     n/a  Protocols used: HIGH BLOOD PRESSURE-A-AH  Message from [place] sent at 09/12/2018 11:59 AM EDT   Summary: Blood pressure    While the Pt was attending Physical therapy, he noticed the pt BP was higher than normal. He recorded 197/95 while sitting and  173/92 while standing no more than 30 seconds.   He did report pt did take BP medication this morning also.

## 2018-09-12 NOTE — Telephone Encounter (Signed)
Call returned to [facility] the physical therapist. He stated that the patient's blood pressure was elevated today at the visit. It was 197/95. When he checked her blood pressure machine, it was consistently 425-956 systolic and 38-75 diastolic. He did state that the patient was asymptomatic.  When the nurse, who was at the visit as well, checked her medications it was different then was last charted. The patient was unsure of what medication that she was actually taking. The nurse and PT have gotten rid of all of the old medication and update her medication to:  Metoprolol Succinate 25 mg daily Hydralazine 50 mg tid  Losartan 100 mg daily  They will go back tomorrow or Friday to check the patient and her blood pressure. They will call with an update.  A call was placed to the patient but there was no answer and no voicemail available.

## 2018-09-12 NOTE — Telephone Encounter (Signed)
Per chart review, Dr. Donivan Scull office has been contacted.

## 2018-09-12 NOTE — Telephone Encounter (Signed)
° °  Pt c/o BP issue: STAT if pt c/o blurred vision, one-sided weakness or slurred speech  1. What are your last 5 BP readings? This morning sitting   197/95 standing 173/86   2. Are you having any other symptoms (ex. Dizziness, headache, blurred vision, passed out)? No current weight 128 some edema in feet / ankles   3. What is your BP issue?  Mark from home health  States Patient may or may not have been taking meds correctly

## 2018-09-13 ENCOUNTER — Other Ambulatory Visit: Payer: Self-pay

## 2018-09-13 ENCOUNTER — Emergency Department
Admission: EM | Admit: 2018-09-13 | Discharge: 2018-09-13 | Disposition: A | Discharge status: Home / Self Care | Payer: Medicare Other | Attending: Emergency Medicine | Admitting: Emergency Medicine

## 2018-09-13 ENCOUNTER — Emergency Department: Payer: Medicare Other

## 2018-09-13 ENCOUNTER — Encounter: Payer: Self-pay | Admitting: *Deleted

## 2018-09-13 DIAGNOSIS — S0001XA Abrasion of scalp, initial encounter: Secondary | ICD-10-CM | POA: Diagnosis not present

## 2018-09-13 DIAGNOSIS — Z23 Encounter for immunization: Secondary | ICD-10-CM | POA: Insufficient documentation

## 2018-09-13 DIAGNOSIS — S0990XA Unspecified injury of head, initial encounter: Secondary | ICD-10-CM | POA: Diagnosis present

## 2018-09-13 DIAGNOSIS — Y929 Unspecified place or not applicable: Secondary | ICD-10-CM | POA: Insufficient documentation

## 2018-09-13 DIAGNOSIS — Z79899 Other long term (current) drug therapy: Secondary | ICD-10-CM | POA: Diagnosis not present

## 2018-09-13 DIAGNOSIS — Y999 Unspecified external cause status: Secondary | ICD-10-CM | POA: Diagnosis not present

## 2018-09-13 DIAGNOSIS — Z7901 Long term (current) use of anticoagulants: Secondary | ICD-10-CM | POA: Insufficient documentation

## 2018-09-13 DIAGNOSIS — I5032 Chronic diastolic (congestive) heart failure: Secondary | ICD-10-CM | POA: Insufficient documentation

## 2018-09-13 DIAGNOSIS — N183 Chronic kidney disease, stage 3 (moderate): Secondary | ICD-10-CM | POA: Insufficient documentation

## 2018-09-13 DIAGNOSIS — W19XXXA Unspecified fall, initial encounter: Secondary | ICD-10-CM

## 2018-09-13 DIAGNOSIS — I130 Hypertensive heart and chronic kidney disease with heart failure and stage 1 through stage 4 chronic kidney disease, or unspecified chronic kidney disease: Secondary | ICD-10-CM | POA: Insufficient documentation

## 2018-09-13 DIAGNOSIS — I4892 Unspecified atrial flutter: Secondary | ICD-10-CM | POA: Insufficient documentation

## 2018-09-13 DIAGNOSIS — W01198A Fall on same level from slipping, tripping and stumbling with subsequent striking against other object, initial encounter: Secondary | ICD-10-CM | POA: Insufficient documentation

## 2018-09-13 DIAGNOSIS — I4891 Unspecified atrial fibrillation: Secondary | ICD-10-CM | POA: Insufficient documentation

## 2018-09-13 DIAGNOSIS — Y9389 Activity, other specified: Secondary | ICD-10-CM | POA: Diagnosis not present

## 2018-09-13 MED ORDER — ACETAMINOPHEN 500 MG PO TABS
1000.0000 mg | ORAL_TABLET | Freq: Once | ORAL | Status: AC
  Administered 2018-09-13: 1000 mg via ORAL
  Filled 2018-09-13: qty 2

## 2018-09-13 MED ORDER — TETANUS-DIPHTH-ACELL PERTUSSIS 5-2.5-18.5 LF-MCG/0.5 IM SUSP
INTRAMUSCULAR | Status: AC
  Administered 2018-09-13: 0.5 mL via INTRAMUSCULAR
  Filled 2018-09-13: qty 0.5

## 2018-09-13 MED ORDER — TETANUS-DIPHTH-ACELL PERTUSSIS 5-2.5-18.5 LF-MCG/0.5 IM SUSP
0.5000 mL | Freq: Once | INTRAMUSCULAR | Status: AC
  Administered 2018-09-13: 0.5 mL via INTRAMUSCULAR

## 2018-09-13 NOTE — Telephone Encounter (Signed)
Mark with [facility] calling States patient's BP is 156/75 sitting and 153/76 standing Weight is 129 Has question regarding medication information  Transferred to [employer]

## 2018-09-13 NOTE — ED Provider Notes (Signed)
[facility] Emergency Department Provider Note  ____________________________________________  Time seen: Approximately 6:34 PM  I have reviewed the triage vital signs and the nursing notes.   HISTORY  Chief Complaint Head Injury and Fall   HPI Jill Castro is a 84 y.o. female with history as listed below including Eliquis for A. fib who presents for evaluation of mechanical fall.  Patient was trying to get into the car of a friend when she lost her balance and fell backwards hitting her head onto the asphalt.  No loss of consciousness.  Patient is complaining of no pain at this time.  She denies neck pain, headache, back pain, extremity pain, hip pain, chest pain or shortness of breath.  The fall was mechanical in nature with no preceding dizziness.  Patient does not remember when her last tetanus shot was and believes it was more than 10 years ago.  Past Medical History:  Diagnosis Date  . Sherran Needs syndrome   . Diastolic CHF, acute (HCC)    Echo (8/10) with EF 60-65%, mild LVH, mild to moderate TR, PASP 40 mmHg.  . Dizziness    Thought to be side effect of Norvasc  . GERD (gastroesophageal reflux disease)   . Glaucoma   . HTN (hypertension)   . Hypercalcemia    On HCTZ  . Hypercholesterolemia   . Hyperkalemia   . Macular degeneration   . Osteoporosis   . Renal fibromuscular dysplasia ([place])   . Renal insufficiency   . S/P cardiac catheterization 05/2002   Luminal irregularities only in the coronaries. EF 55%. There was some irregulatrity in the right renal artery suggestive of possible fibromuscular dysplasia. Myoview in 5/08 showed no ischemia or infarction. Lexiscan myoview at [facility] (10/10) showed EF 55%, normal wall motion, no evience ofor ischemia or infarction.  . Scoliosis associated with other condition    Chronic back pain  . Uterine cancer ([place])    S/P hypsterectomy in 2008  . Wears hearing aid    bilateral    Patient Active  Problem List   Diagnosis Date Noted  . A-fib ([place]) 08/23/2018  . Atrial flutter ([place]) 08/03/2018  . Cervical lymphadenopathy 12/08/2017  . Loose stools 08/20/2017  . Depression 01/29/2017  . Visual hallucinations 07/21/2016  . Loss of weight 05/12/2016  . Facial lesion 08/30/2015  . Health care maintenance 04/22/2015  . DOE (dyspnea on exertion) 02/03/2015  . Drainage from nose 10/27/2014  . GERD (gastroesophageal reflux disease) 03/15/2013  . Right shoulder pain 11/30/2012  . Chronic kidney disease (CKD), stage III (moderate) ([place]) 10/18/2012  . Hyperkalemia 10/18/2012  . Osteoporosis 10/18/2012  . History of endometrial cancer 10/18/2012  . Chronic back pain 10/18/2012  . Hypertension 10/16/2012  . EDEMA 10/30/2009  . Hypercholesterolemia 09/22/2009  . Chest pain 09/08/2009  . Chronic diastolic CHF (congestive heart failure) ([place]) 07/07/2009  . Shortness of breath 07/07/2009    Past Surgical History:  Procedure Laterality Date  . AQUEOUS SHUNT Left 01/30/2017   Procedure: AQUEOUS SHUNT ahmed tube;  Surgeon: Ronnell Freshwater, MD;  Location: [place];  Service: Ophthalmology;  Laterality: Left;  ahmed tube shunt with scleral patch graft  . CARDIAC CATHETERIZATION     Left  . CARDIOVERSION N/A 08/06/2018   Procedure: CARDIOVERSION;  Surgeon: Wellington Hampshire, MD;  Location: [facility] OR[facility];  Service: Cardiovascular;  Laterality: N/A;  . CARDIOVERSION N/A 08/24/2018   Procedure: CARDIOVERSION;  Surgeon: Minna Merritts, MD;  Location: [facility];  Service: Cardiovascular;  Laterality: N/A;  . CATARACT EXTRACTION Right 07/03/2013   [facility] Dr Wallace Going  . CATARACT EXTRACTION W/PHACO Left 01/30/2017   Procedure: CATARACT EXTRACTION PHACO AND INTRAOCULAR LENS PLACEMENT ([place])  left;  Surgeon: Ronnell Freshwater, MD;  Location: [place];  Service: Ophthalmology;  Laterality: Left;  . REFRACTIVE SURGERY    . TEE WITHOUT CARDIOVERSION N/A 08/06/2018   Procedure:  TRANSESOPHAGEAL ECHOCARDIOGRAM (TEE);  Surgeon: Wellington Hampshire, MD;  Location: [facility];  Service: Cardiovascular;  Laterality: N/A;  . TOTAL ABDOMINAL HYSTERECTOMY W/ BILATERAL SALPINGOOPHORECTOMY     stage I C well differentiated adenocarcinoma    Prior to Admission medications   Medication Sig Start Date End Date Taking? Authorizing Provider  Acetaminophen (TYLENOL PO) Take 1 tablet by mouth as needed (PAIN).     [provider]  amiodarone (PACERONE) 200 MG tablet Take 1 tablet (200 mg total) by mouth daily. 08/29/18   Minna Merritts, MD  B Complex Vitamins (PA B-COMPLEX WITH B-12) TABS Take 1 tablet by mouth daily.      [provider]  Calcium Carbonate-Vitamin D (CALCIUM-VITAMIN D) 600-200 MG-UNIT CAPS Take 0.5 mg by mouth daily. Takes 1/2 tablet daily.     [provider]  docusate sodium (STOOL SOFTENER) 100 MG capsule Take 100 mg by mouth daily as needed for mild constipation.    [provider]  ELIQUIS 2.5 MG TABS tablet TAKE ONE TABLET BY MOUTH TWICE DAILY 09/07/18   Minna Merritts, MD  Glucosamine-Chondroitin 1500-1200 MG/30ML LIQD Take 0.5 tablets by mouth daily. Takes 1/2 tablet daily.    [provider]  hydrALAZINE (APRESOLINE) 50 MG tablet Take 1 tablet (50 mg total) by mouth 3 (three) times daily. 09/06/18 12/05/18  Minna Merritts, MD  losartan (COZAAR) 100 MG tablet Take 1 tablet (100 mg total) by mouth daily. 08/29/18   Minna Merritts, MD  Magnesium 250 MG TABS Take 250 mg by mouth daily.    [provider]  metoprolol succinate (TOPROL XL) 25 MG 24 hr tablet Take 1 tablet (25 mg total) by mouth daily. 09/05/18   Minna Merritts, MD  multivitamin [facility]) per tablet Take 1 tablet by mouth daily.      [provider]  nystatin (NYSTATIN) powder Apply 1 g topically 2 (two) times daily as needed (rash).    [provider]  Polyethylene Glycol 3350 (MIRALAX PO) Take by mouth daily.      [provider]  ranitidine (ZANTAC) 150 MG tablet Take 150 mg by mouth 2 (two) times daily.  05/01/17   [provider]  simvastatin (ZOCOR) 10 MG tablet TAKE ONE TABLET BY MOUTH AT BEDTIME 09/06/18   Minna Merritts, MD    Allergies Clonidine; Benicar [olmesartan]; Celebrex [celecoxib]; Iodine; and Penicillins  Family History  Problem Relation Age of Onset  . Heart disease Father   . Heart disease Mother   . Parkinson's disease Mother   . Diabetes Cousin     Social History Social History   Tobacco Use  . Smoking status: Never Smoker  . Smokeless tobacco: Never Used  Substance Use Topics  . Alcohol use: No    Alcohol/week: 0.0 standard drinks  . Drug use: No    Review of Systems Constitutional: Negative for fever. Eyes: Negative for visual changes. ENT: Negative for facial injury or neck injury Cardiovascular: Negative for chest injury. Respiratory: Negative for shortness of breath. Negative for chest wall injury. Gastrointestinal: Negative  for abdominal pain or injury. Genitourinary: Negative for dysuria. Musculoskeletal: Negative for back injury, negative for arm or leg pain. Skin: Negative for laceration/abrasions. Neurological: + head injury.   ____________________________________________   PHYSICAL EXAM:  VITAL SIGNS: ED Triage Vitals  Enc Vitals Group     BP 09/13/18 1607 (!) 166/98     Pulse Rate 09/13/18 1607 65     Resp 09/13/18 1607 20     Temp 09/13/18 1607 98.1 F (36.7 C)     Temp Source 09/13/18 1607 Oral     SpO2 --      Weight 09/13/18 1603 129 lb (58.5 kg)     Height 09/13/18 1603 5\' 4"  (1.626 m)     Head Circumference --      Peak Flow --      Pain Score 09/13/18 1556 0     Pain Loc --      Pain Edu? --      Excl. in [place]? --      Constitutional: Alert and oriented. No acute distress. Does not appear intoxicated. HEENT Head: Normocephalic, scalp hematoma and abrasion with no laceration Face: No facial bony  tenderness. Stable midface Ears: No hemotympanum bilaterally. No Battle sign Eyes: No eye injury. PERRL. No raccoon eyes Nose: Nontender. No epistaxis. No rhinorrhea Mouth/Throat: Mucous membranes are moist. No oropharyngeal blood. No dental injury. Airway patent without stridor. Normal voice. Neck: no C-collar in place. No midline c-spine tenderness.  Cardiovascular: Normal rate, regular rhythm. Normal and symmetric distal pulses are present in all extremities. Pulmonary/Chest: Chest wall is stable and nontender to palpation/compression. Normal respiratory effort. Breath sounds are normal. No crepitus.  Abdominal: Soft, nontender, non distended. Musculoskeletal: Nontender with normal full range of motion in all extremities. No deformities. No thoracic or lumbar midline spinal tenderness. Pelvis is stable. Skin: Skin is warm, dry and intact. No abrasions or contutions. Psychiatric: Speech and behavior are appropriate. Neurological: Normal speech and language. Moves all extremities to command. No gross focal neurologic deficits are appreciated.  Glascow Coma Score: 4 - Opens eyes on own 6 - Follows simple motor commands 5 - Alert and oriented GCS: 15  ____________________________________________   LABS (all labs ordered are listed, but only abnormal results are displayed)  Labs Reviewed - No data to display ____________________________________________  EKG  none  ____________________________________________  RADIOLOGY  I have personally reviewed the images performed during this visit and I agree with the Radiologist's read.   Interpretation by Radiologist:  Ct Head Wo Contrast  Result Date: 09/13/2018 CLINICAL DATA:  [place] backwards striking the head. EXAM: CT HEAD WITHOUT CONTRAST CT CERVICAL SPINE WITHOUT CONTRAST TECHNIQUE: Multidetector CT imaging of the head and cervical spine was performed following the standard protocol without intravenous contrast. Multiplanar CT image  reconstructions of the cervical spine were also generated. COMPARISON:  07/19/2009 FINDINGS: CT HEAD FINDINGS Brain: Age related atrophy. Chronic small-vessel ischemic changes of the cerebral hemispheric white matter. No sign of acute infarction, mass lesion, hemorrhage, hydrocephalus or extra-axial collection. Vascular: There is atherosclerotic calcification of the major vessels at the base of the brain. Skull: No skull fracture. Sinuses/Orbits: Clear/normal Other: Parietal vertex scalp hematoma/laceration. CT CERVICAL SPINE FINDINGS Alignment: No traumatic malalignment. Chronic mild cervical kyphosis. 3 mm degenerative anterolisthesis C3-4. Skull base and vertebrae: No fracture. Chronic posterior element fusion at C2-3. Soft tissues and spinal canal: Negative Disc levels: Ordinary osteoarthritis at the C1-2 articulation. Degenerative spondylosis at C3-4, C4-5, C5-6 and C6-7 with osteophytic encroachment upon the foramina.  Upper chest: Negative.  Benign pleural and parenchymal scarring. Other: None IMPRESSION: Head CT: No acute intracranial finding. Atrophy and chronic small-vessel ischemic change. Parietal vertex scalp hematoma/laceration. No underlying skull fracture. Cervical spine CT: No acute or traumatic finding. Chronic degenerative changes as outlined above. Electronically Signed   By: Nelson Chimes M.D.   On: 09/13/2018 17:21   Ct Cervical Spine Wo Contrast  Result Date: 09/13/2018 CLINICAL DATA:  [place] backwards striking the head. EXAM: CT HEAD WITHOUT CONTRAST CT CERVICAL SPINE WITHOUT CONTRAST TECHNIQUE: Multidetector CT imaging of the head and cervical spine was performed following the standard protocol without intravenous contrast. Multiplanar CT image reconstructions of the cervical spine were also generated. COMPARISON:  07/19/2009 FINDINGS: CT HEAD FINDINGS Brain: Age related atrophy. Chronic small-vessel ischemic changes of the cerebral hemispheric white matter. No sign of acute infarction,  mass lesion, hemorrhage, hydrocephalus or extra-axial collection. Vascular: There is atherosclerotic calcification of the major vessels at the base of the brain. Skull: No skull fracture. Sinuses/Orbits: Clear/normal Other: Parietal vertex scalp hematoma/laceration. CT CERVICAL SPINE FINDINGS Alignment: No traumatic malalignment. Chronic mild cervical kyphosis. 3 mm degenerative anterolisthesis C3-4. Skull base and vertebrae: No fracture. Chronic posterior element fusion at C2-3. Soft tissues and spinal canal: Negative Disc levels: Ordinary osteoarthritis at the C1-2 articulation. Degenerative spondylosis at C3-4, C4-5, C5-6 and C6-7 with osteophytic encroachment upon the foramina. Upper chest: Negative.  Benign pleural and parenchymal scarring. Other: None IMPRESSION: Head CT: No acute intracranial finding. Atrophy and chronic small-vessel ischemic change. Parietal vertex scalp hematoma/laceration. No underlying skull fracture. Cervical spine CT: No acute or traumatic finding. Chronic degenerative changes as outlined above. Electronically Signed   By: Nelson Chimes M.D.   On: 09/13/2018 17:21     ____________________________________________   PROCEDURES  Procedure(s) performed: None Procedures Critical Care performed:  None ____________________________________________   INITIAL IMPRESSION / ASSESSMENT AND PLAN / ED COURSE  84 y.o. female with history as listed below including Eliquis for A. fib who presents for evaluation of mechanical fall.  Patient is well-appearing with no signs or symptoms of basilar skull fracture.  CT head and cervical spine showing no intracranial or cervical injuries.  Patient has an abrasion of her scalp with no laceration.  Tetanus booster was given.  Tylenol for pain.  Discussed close follow-up with primary care doctor and return precautions.  Discussed signs and symptoms of delayed head bleed.  Patient be discharged to the care of her friend.      As part of my  medical decision making, I reviewed the following data within the [place] notes reviewed and incorporated, Radiograph reviewed , Notes from prior ED visits and [place] Controlled Substance Database    Pertinent labs & imaging results that were available during my care of the patient were reviewed by me and considered in my medical decision making (see chart for details).    ____________________________________________   FINAL CLINICAL IMPRESSION(S) / ED DIAGNOSES  Final diagnoses:  Fall, initial encounter  Injury of head, initial encounter  Abrasion of scalp, initial encounter      NEW MEDICATIONS STARTED DURING THIS VISIT:  ED Discharge Orders    None       Note:  This document was prepared using Dragon voice recognition software and may include unintentional dictation errors.    Rudene Re, MD 09/13/18 [phone]

## 2018-09-13 NOTE — Telephone Encounter (Signed)
Spoke with Elta Guadeloupe with [facility] and he is currently seeing patient. He reports that she has some significant swelling and wanted to review if furosemide was still ordered for her. Reviewed that she was taking furosemide 3-4 times a week documented by our physician but I do see where they discontinued it at her most recent discharge from the hospital. Weight is up to 129 and he wanted to follow up on this. She does currently have some of this medication available. Instructed him to have her take one tablet of the furosemide, encourage use of compression hose, and elevation to see if that will also help. Advised that I would route message to provider to see if he wants to add the furosemide back on her list to possibly take as needed. He verbalized understanding of instructions, agreement with plan, and has no further questions at this time.

## 2018-09-13 NOTE — Telephone Encounter (Signed)
Per note she is to monitor and f/u with them  Please confirm.

## 2018-09-13 NOTE — ED Triage Notes (Addendum)
[facility].  Pt fell backwards onto the pavement while trying to get into the car.  Pt has abrasion to back of head.  No loc.  No vomiting.  Pt alert  Speech clear. Pt unsure if taking blood thinners now. Pt denies neck or back pain.  No chest pain or sob

## 2018-09-13 NOTE — Telephone Encounter (Signed)
LMTCB

## 2018-09-13 NOTE — Discharge Instructions (Addendum)

## 2018-09-15 NOTE — Telephone Encounter (Signed)
Would add lasix 20 three times a week, mon/wed/Fri for example She has chronic edema , L>R Needs compression hose on would continue to monitor BP on lasix thx TG

## 2018-09-17 ENCOUNTER — Encounter: Payer: Self-pay | Admitting: Family Medicine

## 2018-09-17 ENCOUNTER — Ambulatory Visit: Payer: Medicare Other | Admitting: Family Medicine

## 2018-09-17 VITALS — BP 158/76 | HR 58 | Temp 98.0°F | Ht 64.0 in | Wt 129.6 lb

## 2018-09-17 DIAGNOSIS — W19XXXA Unspecified fall, initial encounter: Secondary | ICD-10-CM | POA: Diagnosis not present

## 2018-09-17 DIAGNOSIS — S0990XA Unspecified injury of head, initial encounter: Secondary | ICD-10-CM | POA: Diagnosis not present

## 2018-09-17 DIAGNOSIS — I4891 Unspecified atrial fibrillation: Secondary | ICD-10-CM | POA: Diagnosis not present

## 2018-09-17 NOTE — Patient Instructions (Signed)
Head Injury, Adult  There are many types of head injuries. They can be as minor as a bump. Some head injuries can be worse. Worse injuries include:  · A strong hit to the head that hurts the brain (concussion).  · A bruise of the brain (contusion). This means there is bleeding in the brain that can cause swelling.  · A cracked skull (skull fracture).  · Bleeding in the brain that gathers, gets thick (makes a clot), and forms a bump (hematoma).    Most problems from a head injury come in the first 24 hours. However, you may still have side effects up to 7-10 days after your injury. It is important to watch your condition for any changes.  Follow these instructions at home:  Activity  · Rest as much as possible.  · Avoid activities that are hard or tiring.  · Make sure you get enough sleep.  · Limit activities that need a lot of thought or attention, such as:  ? Watching TV.  ? Playing memory games and puzzles.  ? Job-related work or homework.  ? Working on the computer, social media, and texting.  · Avoid activities that could cause another head injury until your doctor says it is okay. This includes playing sports.  · Ask your doctor when it is safe for you to go back to your normal activities, such as work or school. Ask your doctor for a step-by-step plan for slowly going back to your normal activities.  · Ask your doctor when you can drive, ride a bicycle, or use heavy machinery. Never do these activities if you are dizzy.  Lifestyle  · Do not drink alcohol until your doctor says it is okay.  · Avoid drug use.  · If it is harder than usual to remember things, write them down.  · If you are easily distracted, try to do one thing at a time.  · Talk with family members or close friends when making important decisions.  · Tell your friends, family, a trusted coworker, and work manager about your injury, symptoms, and limits (restrictions). Have them watch for any problems that are new or getting worse.  General  instructions  · Take over-the-counter and prescription medicines only as told by your doctor.  · Have someone stay with you for 24 hours after your head injury. This person should watch you for any changes in your symptoms and be ready to get help.  · Keep all follow-up visits as told by your doctor. This is important.  How is this prevented?  · Work on your balance and strength. This can help you avoid falls.  · Wear a seatbelt when you are in a moving vehicle.  · Wear a helmet when:  ? Riding a bicycle.  ? Skiing.  ? Doing any other sport or activity that has a risk of injury.  · Drink alcohol only in moderation.  · Make your home safer by:  ? Getting rid of clutter from the floors and stairs, like things that can make you trip.  ? Using grab bars in bathrooms and handrails by stairs.  ? Placing non-slip mats on floors and in bathtubs.  ? Putting more light in dim areas.  Get help right away if:  · You have:  ? A very bad (severe) headache that is not helped by medicine.  ? Trouble walking or weakness in your arms and legs.  ? Clear or bloody fluid coming from your nose   or ears.  ? Changes in your seeing (vision).  ? Jerky movements that you cannot control (seizure).  · You throw up (vomit).  · Your symptoms get worse.  · You lose balance.  · Your speech is slurred.  · You pass out.  · You are sleepier and have trouble staying awake.  · The black centers of your eyes (pupils) change in size.  These symptoms may be an emergency. Do not wait to see if the symptoms will go away. Get medical help right away. Call your local emergency services (911 in the [place] Do not drive yourself to the hospital.  This information is not intended to replace advice given to you by your health care provider. Make sure you discuss any questions you have with your health care provider.  Document Released: 10/27/2008 Document Revised: 03/10/2017 Document Reviewed: 05/24/2016  Elsevier Interactive Patient Education © 2018 [employer]

## 2018-09-17 NOTE — Progress Notes (Signed)
Subjective:    [name], female    DOB: [birth year], 84 y.o.   MRN: [id number]  HPI  Patient presents to clinic for ER follow up, she fell on 10/17 when trying to get into car - fell backward and hit head on asphalt. She is on eliquis for Afib. She is feeling well, she is walking at her normal pace with her wheeled walker. She has been eating and drinking normally - attends lunch and usually dinner at her independent living complex. Denies nausea or vomiting. Denies headaches. Uses tylenol for her chronic back pain every day (scoliosis).  Imaging done in ER reviewed:  CT HEAD FINDINGS  Brain: Age related atrophy. Chronic small-vessel ischemic changes of the cerebral hemispheric white matter. No sign of acute infarction, mass lesion, hemorrhage, hydrocephalus or extra-axial collection.  Vascular: There is atherosclerotic calcification of the major vessels at the base of the brain.  Skull: No skull fracture.  Sinuses/Orbits: Clear/normal  Other: Parietal vertex scalp hematoma/laceration.  CT CERVICAL SPINE FINDINGS  Alignment: No traumatic malalignment. Chronic mild cervical kyphosis. 3 mm degenerative anterolisthesis C3-4.  Skull base and vertebrae: No fracture. Chronic posterior element fusion at C2-3.  Soft tissues and spinal canal: Negative  Disc levels: Ordinary osteoarthritis at the C1-2 articulation. Degenerative spondylosis at C3-4, C4-5, C5-6 and C6-7 with osteophytic encroachment upon the foramina.  Upper chest: Negative.  Benign pleural and parenchymal scarring.  Other: None  IMPRESSION: Head CT: No acute intracranial finding. Atrophy and chronic small-vessel ischemic change. Parietal vertex scalp hematoma/laceration. No underlying skull fracture.  Cervical spine CT: No acute or traumatic finding. Chronic degenerative changes as outlined above.  Patient Active Problem List   Diagnosis Date Noted  . A-fib ([place]) 08/23/2018   . Atrial flutter ([place]) 08/03/2018  . Cervical lymphadenopathy 12/08/2017  . Loose stools 08/20/2017  . Depression 01/29/2017  . Visual hallucinations 07/21/2016  . Loss of weight 05/12/2016  . Facial lesion 08/30/2015  . Health care maintenance 04/22/2015  . DOE (dyspnea on exertion) 02/03/2015  . Drainage from nose 10/27/2014  . GERD (gastroesophageal reflux disease) 03/15/2013  . Right shoulder pain 11/30/2012  . Chronic kidney disease (CKD), stage III (moderate) ([place]) 10/18/2012  . Hyperkalemia 10/18/2012  . Osteoporosis 10/18/2012  . History of endometrial cancer 10/18/2012  . Chronic back pain 10/18/2012  . Hypertension 10/16/2012  . EDEMA 10/30/2009  . Hypercholesterolemia 09/22/2009  . Chest pain 09/08/2009  . Chronic diastolic CHF (congestive heart failure) ([place]) 07/07/2009  . Shortness of breath 07/07/2009   Social History   Tobacco Use  . Smoking status: Never Smoker  . Smokeless tobacco: Never Used  Substance Use Topics  . Alcohol use: No    Alcohol/week: 0.0 standard drinks   Review of Systems  Constitutional: Negative for chills, fatigue and fever.  HENT: Negative for congestion, ear pain, sinus pain and sore throat.   Eyes: Negative.   Respiratory: Negative for cough, shortness of breath and wheezing.   Cardiovascular: Negative for chest pain, palpitations and leg swelling.  Gastrointestinal: Negative for abdominal pain, diarrhea, nausea and vomiting.  Genitourinary: Negative for dysuria, frequency and urgency.  Musculoskeletal: Negative for arthralgias and myalgias.  Skin: Negative for color change, pallor and rash. +scab on back of head from superficial abrasion.  Neurological: Negative for syncope, light-headedness and headaches.  Psychiatric/Behavioral: The patient is not nervous/anxious.       Objective:   Physical Exam  Constitutional: She is oriented to person, place, and time.  Neck: Neck supple. No tracheal deviation present.    Cardiovascular: Normal rate and regular rhythm.  Pulmonary/Chest: Effort normal and breath sounds normal. No respiratory distress.  Musculoskeletal: She exhibits no edema.  Scoliosis curve of the spine. Uses wheeled walker.   Neurological: She is alert and oriented to person, place, and time. No cranial nerve deficit. She exhibits normal muscle tone.  Speech clear. Able to raise eyebrows, puff out cheeks, clench teeth without issue. Grips equal and strong.   Skin: Skin is warm and dry. No pallor.  Psychiatric: She has a normal mood and affect. Her behavior is normal.  Nursing note and vitals reviewed.   Vitals:   09/17/18 0825  BP: (!) 158/76  Pulse: (!) 58  Temp: 98 F (36.7 C)  SpO2: 96%    Assessment & Plan:   Fall/Head Injury -- Imaging done in ER reveals no acute issues. She has not had any issues since returning home. Advised to continue to use tylenol as needed for pain. Advised if she has any alarm symptoms like worsening headache, loss of balance, slurred speech, fainting, trouble walking -- call office right away or go to ER.   Afib -- Rate controlled with metoprolol and on chronic Eliquis for blood clot prevention.   Keep regular follow up as planned. Return to clinic sooner if issues arise.

## 2018-09-17 NOTE — Telephone Encounter (Signed)
Spoke w/ pt.  She reports that she fell last week; she wore "loose shoes" and fell as she was getting in to her car. She went to ED on 10/17 for evaluation and saw her PCP today.  She is feeling much better today. I advised her that Elta Guadeloupe had called the office last week regarding her BP and asked if I could review Dr. Donivan Scull recommendations w/ her, but she asks that I call Elta Guadeloupe so that he can tell her what pills to take.   Called and spoke w/ Elta Guadeloupe.  Advised him of Dr. Donivan Scull recommendation.  He is agreeable to adding lasix and monitoring BP. He reports that pt has compression hose and does wear them daily. He will try to get out there to see her today, but most likely will be tomorrow.   I don't see that lasix is on pt's current med list - Dr. Rockey Situ, what dosage would you like her to be on?   She was previously on 20 & 40 mg.

## 2018-09-17 NOTE — Telephone Encounter (Signed)
Attempted to send in RX for lasix 20 mg- take 1 tablet by mouth three times a week on M/W/F per MD recommendations on 10/19.  Lasix was flagged as an contraindication due to an allergy with Celebrex- allergy not specified.   To MD to review prior to sending in RX.

## 2018-09-17 NOTE — Telephone Encounter (Signed)
Per chart she is being followed with cardiology and was seen by NP in our office today

## 2018-09-18 ENCOUNTER — Telehealth: Payer: Self-pay | Admitting: Cardiovascular Disease

## 2018-09-18 NOTE — Telephone Encounter (Signed)
He has been on lasix with no problem in the past Why dont we go back on lasix 20 daily

## 2018-09-18 NOTE — Telephone Encounter (Signed)
Mark with [facility] calling to give [place] today's BP readings on patient  09/18/18  Sitting 189/99 Standing 197/96  Please call back if we had a concerns

## 2018-09-19 NOTE — Telephone Encounter (Signed)
Left a message to call back.

## 2018-09-20 MED ORDER — FUROSEMIDE 20 MG PO TABS: 20.0000 mg | ORAL_TABLET | Freq: Every day | ORAL | 3 refills | Status: AC

## 2018-09-20 NOTE — Telephone Encounter (Signed)
Jill Castro called back and verbalized understanding for furosemide 20 mg by mouth once a day. He felt like that was best for the patient. He helps patient with managing her pill boxes and she gets confused easily with it all. He requested the pharmacy to deliver it if possible.  Rx sent to pharmacy. Called [facility] and they will call patient and arrange for home delivery for patient.

## 2018-09-20 NOTE — Telephone Encounter (Signed)
No answer. Left message to call back.  From another phone call, Elta Guadeloupe is to call our office tomorrow with update on patient's status and vital signs.

## 2018-09-20 NOTE — Telephone Encounter (Signed)
S/w Mark. He said patient's BP's are better than last week though these readings listed were elevated.  He is going back to see her tomorrow and will check vital signs and call us with an update tomorrow.

## 2018-09-21 MED ORDER — HYDRALAZINE HCL 100 MG PO TABS: 100.0000 mg | ORAL_TABLET | Freq: Three times a day (TID) | ORAL | 3 refills | Status: DC

## 2018-09-21 MED ORDER — HYDRALAZINE HCL 100 MG PO TABS: 100.0000 mg | ORAL_TABLET | Freq: Three times a day (TID) | ORAL | 3 refills | Status: AC

## 2018-09-21 NOTE — Telephone Encounter (Signed)
S/w Mark at [facility].  Patient started furosemide 20 mg daily today. (see prior telephone entry) BP's have decreased as well.  10:50a 160/55 sitting 11:02a 176/84 took after walking across room then sitting 156/74 standing Immediately after  146/75 standing   Recent weights: 10/17 129lb 10/23 127lb  10/25 124lb  Patient is not as short of breath and activity tolerance has improved.  Patient still has significant left leg edema.  Right leg edema has decreased and basically gone today. Patient has been wearing her compression stockings.  Advised to continue with currently medications. Jill Castro will go back to on Monday to check on patient.  Routing to Dr Rockey Situ for review.

## 2018-09-21 NOTE — Telephone Encounter (Signed)
No answer. Left message to call back.   

## 2018-09-21 NOTE — Telephone Encounter (Signed)
Mark calling with vitals BP 10:50a 160/55 sitting 11:02a 176/84 took after walking across room then sitting 156/74 standing Immediately after  146/75 standing  Weight is 124  Patient has mild SOB and in left leg from knee down quite a bit of edema  Patient is taking furosemide Please call Jill Castro to discuss

## 2018-09-21 NOTE — Telephone Encounter (Signed)
Not clear what medication she is taking I think it is losartan metoprolol hydralazine with Lasix daily Would consider increasing hydralazine from 50 up to 100 mg 3 times daily for high blood pressure She was on this dose previously

## 2018-09-21 NOTE — Telephone Encounter (Signed)
Spoke face to face with Dr Rockey Situ. Let him know patient is having trouble with pill box on her own and it may not be until Monday when home care nurse can get to see her that she increases the hydralazine. He said that was fine and if needed patient can try 75 mg TID first or go straight to 100 mg TID. He feels patient will need to be on it as this was her original regimen before she was taken off everything.   Mark calling back. He verbalized understanding of Dr Donivan Scull recommendations on increasing hydralazine. His concern is that patient is having trouble with organizing her medications and gets confused easily. He goes over the medicine box every time he sees her to make sure it is correct. Although still challenging for her to comprehend at times. He will see patient Monday and call us with an update or any other concerns or issues.

## 2018-09-25 NOTE — Telephone Encounter (Signed)
Verbals given  

## 2018-09-26 NOTE — Telephone Encounter (Signed)
Mickel Baas from [facility] calling States that she will need clarification on patient's medications, attempting to fill out a medi planner and is noticing conflicting information Please call to discuss at [phone]

## 2018-09-26 NOTE — Telephone Encounter (Signed)
Med clarification was given to the nurse.

## 2018-09-28 ENCOUNTER — Other Ambulatory Visit: Payer: Self-pay

## 2018-09-28 ENCOUNTER — Emergency Department
Admission: EM | Admit: 2018-09-28 | Discharge: 2018-10-02 | Disposition: A | Discharge status: Other Acute Inpatient Hospital | Payer: Medicare Other | Attending: Emergency Medicine | Admitting: Emergency Medicine

## 2018-09-28 ENCOUNTER — Encounter: Payer: Self-pay | Admitting: Intensive Care

## 2018-09-28 ENCOUNTER — Emergency Department: Payer: Medicare Other

## 2018-09-28 ENCOUNTER — Ambulatory Visit: Payer: Self-pay | Admitting: *Deleted

## 2018-09-28 DIAGNOSIS — Z79899 Other long term (current) drug therapy: Secondary | ICD-10-CM | POA: Diagnosis not present

## 2018-09-28 DIAGNOSIS — N183 Chronic kidney disease, stage 3 (moderate): Secondary | ICD-10-CM | POA: Diagnosis not present

## 2018-09-28 DIAGNOSIS — K5900 Constipation, unspecified: Secondary | ICD-10-CM | POA: Diagnosis not present

## 2018-09-28 DIAGNOSIS — I130 Hypertensive heart and chronic kidney disease with heart failure and stage 1 through stage 4 chronic kidney disease, or unspecified chronic kidney disease: Secondary | ICD-10-CM | POA: Diagnosis not present

## 2018-09-28 DIAGNOSIS — Y999 Unspecified external cause status: Secondary | ICD-10-CM | POA: Diagnosis not present

## 2018-09-28 DIAGNOSIS — Z7901 Long term (current) use of anticoagulants: Secondary | ICD-10-CM | POA: Diagnosis not present

## 2018-09-28 DIAGNOSIS — W1789XA Other fall from one level to another, initial encounter: Secondary | ICD-10-CM | POA: Diagnosis not present

## 2018-09-28 DIAGNOSIS — I5032 Chronic diastolic (congestive) heart failure: Secondary | ICD-10-CM | POA: Insufficient documentation

## 2018-09-28 DIAGNOSIS — Z8542 Personal history of malignant neoplasm of other parts of uterus: Secondary | ICD-10-CM | POA: Diagnosis not present

## 2018-09-28 DIAGNOSIS — R1031 Right lower quadrant pain: Secondary | ICD-10-CM | POA: Insufficient documentation

## 2018-09-28 DIAGNOSIS — Z9181 History of falling: Secondary | ICD-10-CM | POA: Insufficient documentation

## 2018-09-28 DIAGNOSIS — Y929 Unspecified place or not applicable: Secondary | ICD-10-CM | POA: Diagnosis not present

## 2018-09-28 DIAGNOSIS — M25551 Pain in right hip: Secondary | ICD-10-CM

## 2018-09-28 DIAGNOSIS — Y939 Activity, unspecified: Secondary | ICD-10-CM | POA: Insufficient documentation

## 2018-09-28 DIAGNOSIS — S3282XA Multiple fractures of pelvis without disruption of pelvic ring, initial encounter for closed fracture: Secondary | ICD-10-CM | POA: Diagnosis not present

## 2018-09-28 DIAGNOSIS — S79911A Unspecified injury of right hip, initial encounter: Secondary | ICD-10-CM | POA: Diagnosis present

## 2018-09-28 LAB — BASIC METABOLIC PANEL
ANION GAP: 12 (ref 5–15)
BUN: 40 mg/dL — ABNORMAL HIGH (ref 8–23)
CHLORIDE: 97 mmol/L — AB (ref 98–111)
CO2: 24 mmol/L (ref 22–32)
Calcium: 10.2 mg/dL (ref 8.9–10.3)
Creatinine, Ser: 1.86 mg/dL — ABNORMAL HIGH (ref 0.44–1.00)
GFR calc Af Amer: 27 mL/min — ABNORMAL LOW (ref >60)
GFR, EST NON AFRICAN AMERICAN: 23 mL/min — AB (ref >60)
GLUCOSE: 119 mg/dL — AB (ref 70–99)
POTASSIUM: 4 mmol/L (ref 3.5–5.1)
Sodium: 133 mmol/L — ABNORMAL LOW (ref 135–145)

## 2018-09-28 LAB — CBC WITH DIFFERENTIAL/PLATELET
Abs Immature Granulocytes: 0.06 10*3/uL (ref 0.00–0.07)
Basophils Absolute: 0 10*3/uL (ref 0.0–0.1)
Basophils Relative: 0 %
Eosinophils Absolute: 0.1 10*3/uL (ref 0.0–0.5)
Eosinophils Relative: 1 %
HCT: 41.2 % (ref 36.0–46.0)
HEMOGLOBIN: 13.5 g/dL (ref 12.0–15.0)
IMMATURE GRANULOCYTES: 1 %
LYMPHS ABS: 1.3 10*3/uL (ref 0.7–4.0)
LYMPHS PCT: 11 %
MCH: 30.3 pg (ref 26.0–34.0)
MCHC: 32.8 g/dL (ref 30.0–36.0)
MCV: 92.6 fL (ref 80.0–100.0)
MONOS PCT: 11 %
Monocytes Absolute: 1.2 10*3/uL — ABNORMAL HIGH (ref 0.1–1.0)
NEUTROS PCT: 76 %
Neutro Abs: 8.4 10*3/uL — ABNORMAL HIGH (ref 1.7–7.7)
PLATELETS: 293 10*3/uL (ref 150–400)
RBC: 4.45 MIL/uL (ref 3.87–5.11)
RDW: 14.8 % (ref 11.5–15.5)
WBC: 11 10*3/uL — ABNORMAL HIGH (ref 4.0–10.5)
nRBC: 0 % (ref 0.0–0.2)

## 2018-09-28 MED ORDER — METOPROLOL SUCCINATE ER 50 MG PO TB24
25.0000 mg | ORAL_TABLET | Freq: Every day | ORAL | Status: DC
  Administered 2018-09-29 – 2018-10-02 (×4): 25 mg via ORAL
  Filled 2018-09-28 (×4): qty 1

## 2018-09-28 MED ORDER — LOSARTAN POTASSIUM 50 MG PO TABS
100.0000 mg | ORAL_TABLET | Freq: Every day | ORAL | Status: DC
  Administered 2018-09-30 – 2018-10-02 (×3): 100 mg via ORAL
  Filled 2018-09-28 (×4): qty 2

## 2018-09-28 MED ORDER — APIXABAN 2.5 MG PO TABS
2.5000 mg | ORAL_TABLET | Freq: Two times a day (BID) | ORAL | Status: DC
  Administered 2018-09-28 – 2018-10-02 (×8): 2.5 mg via ORAL
  Filled 2018-09-28 (×9): qty 1

## 2018-09-28 MED ORDER — SODIUM CHLORIDE 0.9 % IV BOLUS
250.0000 mL | Freq: Once | INTRAVENOUS | Status: AC
  Administered 2018-09-28: 250 mL via INTRAVENOUS

## 2018-09-28 MED ORDER — HYDRALAZINE HCL 50 MG PO TABS
100.0000 mg | ORAL_TABLET | Freq: Three times a day (TID) | ORAL | Status: DC
  Administered 2018-09-28 – 2018-10-02 (×11): 100 mg via ORAL
  Filled 2018-09-28 (×11): qty 2

## 2018-09-28 MED ORDER — SIMVASTATIN 10 MG PO TABS
10.0000 mg | ORAL_TABLET | Freq: Every day | ORAL | Status: DC
  Administered 2018-09-28 – 2018-10-01 (×4): 10 mg via ORAL
  Filled 2018-09-28 (×4): qty 1

## 2018-09-28 MED ORDER — FAMOTIDINE 20 MG PO TABS
20.0000 mg | ORAL_TABLET | Freq: Two times a day (BID) | ORAL | Status: DC
  Administered 2018-09-28 – 2018-10-02 (×8): 20 mg via ORAL
  Filled 2018-09-28 (×8): qty 1

## 2018-09-28 MED ORDER — ACETAMINOPHEN 500 MG PO TABS
1000.0000 mg | ORAL_TABLET | Freq: Once | ORAL | Status: AC
  Administered 2018-09-28: 1000 mg via ORAL
  Filled 2018-09-28: qty 2

## 2018-09-28 MED ORDER — OXYCODONE HCL 5 MG PO TABS
5.0000 mg | ORAL_TABLET | Freq: Once | ORAL | Status: AC
  Administered 2018-09-28: 5 mg via ORAL
  Filled 2018-09-28: qty 1

## 2018-09-28 NOTE — ED Provider Notes (Signed)
-----------------------------------------   5:41 PM on 09/28/2018 -----------------------------------------   Blood pressure 134/61, pulse (!) 51, temperature (!) 97.5 F (36.4 C), temperature source Oral, resp. rate 18, height 5\' 4"  (1.626 m), weight 58.8 kg, SpO2 94 %.  Assuming care from Dr. Clearnce Hasten of Jill Castro is a 84 y.o. female with a chief complaint of Leg Pain .    Please refer to H&P by previous MD for further details.  I have personally reviewed the images performed during this visit and I agree with the Radiologist's read.   Interpretation by Radiologist:  Ct Pelvis Wo Contrast  Result Date: 09/28/2018 CLINICAL DATA:  Patient fell a few weeks ago with study increasing pain in the right hip and leg. Left leg swelling. EXAM: CT PELVIS WITHOUT CONTRAST TECHNIQUE: Multidetector CT imaging of the pelvis was performed following the standard protocol without intravenous contrast. COMPARISON:  CT AP 09/02/2010 FINDINGS: Urinary Tract: 15 mm cyst in the interpolar right kidney with at least 2 hyperdense 5 mm or less lesions in the lower pole of the right kidney statistically consistent with hemorrhagic or proteinaceous cysts. Intact urinary bladder. Bowel: Moderate stool retention within the colon and rectum without obstruction or inflammation. Vascular/Lymphatic: Aortoiliac atherosclerosis with ectasia of the abdominal aorta. Reproductive:  Hysterectomy.  No adnexal mass. Other: No free air or free fluid. Mild subcutaneous soft tissue induration of the mons pubis. Musculoskeletal: Recent appearing nondisplaced right sacral alar, right pubic symphysis and inferior right pubic ramus fractures. No joint dislocation of either hip. Osteoarthritic joint space narrowing of the included sacroiliac joints a IMPRESSION: 1. Recent fractures of the right sacral ala, right pubic symphysis, and right inferior pubic ramus. 2. Simple and complex cysts of the included right kidney. 3. Increased stool  retention the colon and rectum. Electronically Signed   By: Ashley Royalty M.D.   On: 09/28/2018 15:26    CT consistent with several pelvic fractures. No hip fracture. Patient with limited ambulation even after pain control. Concern to discharge back since patient lives alone in independent living. PT and SW consults placed for placement in rehab. Home meds ordered.         Rudene Re, MD 09/28/18 [phone]

## 2018-09-28 NOTE — NC FL2 (Signed)
[place] LEVEL OF CARE SCREENING TOOL     IDENTIFICATION  Patient Name: Jill Castro Birthdate: 1927-08-01 Sex: female Admission Date (Current Location): 09/28/2018  [place] and [place] Number:  Engineering geologist and Address:  [facility], [address] [place], [place] [address]      Provider Number: [id number]  Attending Physician Name and Address:  Rudene Re, MD  Relative Name and Phone Number:  Georgina Quint- daughter [phone]    Current Level of Care: Hospital Recommended Level of Care: [place] Prior Approval Number:    Date Approved/Denied:   PASRR Number: [id number]  Discharge Plan: SNF    Current Diagnoses: Patient Active Problem List   Diagnosis Date Noted  . A-fib ([place]) 08/23/2018  . Atrial flutter ([place]) 08/03/2018  . Cervical lymphadenopathy 12/08/2017  . Loose stools 08/20/2017  . Depression 01/29/2017  . Visual hallucinations 07/21/2016  . Loss of weight 05/12/2016  . Facial lesion 08/30/2015  . Health care maintenance 04/22/2015  . DOE (dyspnea on exertion) 02/03/2015  . Drainage from nose 10/27/2014  . GERD (gastroesophageal reflux disease) 03/15/2013  . Right shoulder pain 11/30/2012  . Chronic kidney disease (CKD), stage III (moderate) ([place]) 10/18/2012  . Hyperkalemia 10/18/2012  . Osteoporosis 10/18/2012  . History of endometrial cancer 10/18/2012  . Chronic back pain 10/18/2012  . Hypertension 10/16/2012  . EDEMA 10/30/2009  . Hypercholesterolemia 09/22/2009  . Chest pain 09/08/2009  . Chronic diastolic CHF (congestive heart failure) ([place]) 07/07/2009  . Shortness of breath 07/07/2009    Orientation RESPIRATION BLADDER Height & Weight     Self, Time, Situation, Place  Normal Continent Weight: 129 lb 9.6 oz (58.8 kg) Height:  5\' 4"  (162.6 cm)  BEHAVIORAL SYMPTOMS/MOOD NEUROLOGICAL BOWEL NUTRITION STATUS  (none) (none) Continent Diet(Regular )  AMBULATORY  STATUS COMMUNICATION OF NEEDS Skin   Extensive Assist Verbally Normal                       Personal Care Assistance Level of Assistance  Bathing, Feeding, Dressing Bathing Assistance: Limited assistance Feeding assistance: Independent Dressing Assistance: Limited assistance     Functional Limitations Info  Sight, Hearing, Speech Sight Info: Adequate Hearing Info: Adequate Speech Info: Adequate    SPECIAL CARE FACTORS FREQUENCY  PT (By licensed PT), OT (By licensed OT)     PT Frequency: 5 OT Frequency: 5            Contractures Contractures Info: Not present    Additional Factors Info  Code Status, Allergies Code Status Info: Full Code  Allergies Info: Clonidine, Benicar Olmesartan, Celebrex Celecoxib, Iodine, Penicillins           Current Medications (09/28/2018):  This is the current hospital active medication list No current facility-administered medications for this encounter.    Current Outpatient Medications  Medication Sig Dispense Refill  . Acetaminophen (TYLENOL PO) Take 1 tablet by mouth as needed (PAIN).     Marland Kitchen amiodarone (PACERONE) 200 MG tablet Take 1 tablet (200 mg total) by mouth daily. 90 tablet 3  . B Complex Vitamins (PA B-COMPLEX WITH B-12) TABS Take 1 tablet by mouth daily.      . Calcium Carbonate-Vitamin D (CALCIUM-VITAMIN D) 600-200 MG-UNIT CAPS Take 0.5 mg by mouth daily. Takes 1/2 tablet daily.     Marland Kitchen docusate sodium (STOOL SOFTENER) 100 MG capsule Take 100 mg by mouth daily as needed for mild constipation.    Marland Kitchen ELIQUIS 2.5  MG TABS tablet TAKE ONE TABLET BY MOUTH TWICE DAILY (Patient taking differently: Take 2.5 mg by mouth 2 (two) times daily. ) 60 tablet 5  . furosemide (LASIX) 20 MG tablet Take 1 tablet (20 mg total) by mouth daily. 90 tablet 3  . Glucosamine-Chondroitin 1500-1200 MG/30ML LIQD Take 0.5 tablets by mouth daily. Takes 1/2 tablet daily.    . hydrALAZINE (APRESOLINE) 100 MG tablet Take 1 tablet (100 mg total) by mouth 3  (three) times daily. 270 tablet 3  . losartan (COZAAR) 100 MG tablet Take 1 tablet (100 mg total) by mouth daily. 90 tablet 3  . Magnesium 250 MG TABS Take 250 mg by mouth daily.    . metoprolol succinate (TOPROL XL) 25 MG 24 hr tablet Take 1 tablet (25 mg total) by mouth daily. 90 tablet 2  . multivitamin (THERAGRAN) per tablet Take 1 tablet by mouth daily.      Marland Kitchen nystatin (NYSTATIN) powder Apply 1 g topically 2 (two) times daily as needed (rash).    . Polyethylene Glycol 3350 (MIRALAX PO) Take by mouth daily.     . ranitidine (ZANTAC) 150 MG tablet Take 150 mg by mouth 2 (two) times daily.     . simvastatin (ZOCOR) 10 MG tablet TAKE ONE TABLET BY MOUTH AT BEDTIME (Patient taking differently: Take 10 mg by mouth daily at 6 PM. ) 90 tablet 0     Discharge Medications: Please see discharge summary for a list of discharge medications.  Relevant Imaging Results:  Relevant Lab Results:   Additional Information SSN: [id number]  Annamaria Boots, [place]

## 2018-09-28 NOTE — ED Notes (Signed)
Pt states she is ready to go to bed. Pt states she wants to lay on her left side. Pt given bedtime meds and repeat vitals performed. Socks removed per pt request. Bed laid flat and cover provided. Pt glasses and hair pins laid on the mayo stand beside the bed. Lights out and will check on pt intermittently. Pt is stable at this time

## 2018-09-28 NOTE — ED Triage Notes (Signed)
Pt arrives via ems from independent apt at [facility], pt reports that she fell a couple weeks ago getting back into her car, a friend had driven her, pt was evaluated for head pain and cont to have a hematoma to the back of her head, pt reports that she has had a steady increase in pain in the right hip and leg, ems reports they noticed left lower leg swelling on their exam, however pt reports that her dr is aware of this, pt also has a skin tear noted to the left arm near the IV site from 2 weeks ago, pt is seated upright on the stretcher without obvious distress noted, speaking clearly

## 2018-09-28 NOTE — ED Provider Notes (Signed)
[facility] Emergency Department Provider Note  ____________________________________________   First MD Initiated Contact with Patient 09/28/18 1408     (approximate)  I have reviewed the triage vital signs and the nursing notes.   HISTORY  Chief Complaint Leg Pain   HPI Jill Castro is a 84 y.o. female with a history of atrial fibrillation on Eliquis was presented to the emergency department with right groin and hip pain.  She says that she had a fall on 17 October where she hit her head.  However, since then is also been having right hip and groin pain and now is unable to ambulate.  EMS also noted that she had left lower externally swelling but she states that this is chronic and unchanged.  States that she feels a tightness to the proximal and medial right lower extremity and groin with movement and weightbearing.   Past Medical History:  Diagnosis Date  . Sherran Needs syndrome   . Diastolic CHF, acute (HCC)    Echo (8/10) with EF 60-65%, mild LVH, mild to moderate TR, PASP 40 mmHg.  . Dizziness    Thought to be side effect of Norvasc  . GERD (gastroesophageal reflux disease)   . Glaucoma   . HTN (hypertension)   . Hypercalcemia    On HCTZ  . Hypercholesterolemia   . Hyperkalemia   . Macular degeneration   . Osteoporosis   . Renal fibromuscular dysplasia ([place])   . Renal insufficiency   . S/P cardiac catheterization 05/2002   Luminal irregularities only in the coronaries. EF 55%. There was some irregulatrity in the right renal artery suggestive of possible fibromuscular dysplasia. Myoview in 5/08 showed no ischemia or infarction. Lexiscan myoview at [facility] (10/10) showed EF 55%, normal wall motion, no evience ofor ischemia or infarction.  . Scoliosis associated with other condition    Chronic back pain  . Uterine cancer ([place])    S/P hypsterectomy in 2008  . Wears hearing aid    bilateral    Patient Active Problem List   Diagnosis  Date Noted  . A-fib ([place]) 08/23/2018  . Atrial flutter ([place]) 08/03/2018  . Cervical lymphadenopathy 12/08/2017  . Loose stools 08/20/2017  . Depression 01/29/2017  . Visual hallucinations 07/21/2016  . Loss of weight 05/12/2016  . Facial lesion 08/30/2015  . Health care maintenance 04/22/2015  . DOE (dyspnea on exertion) 02/03/2015  . Drainage from nose 10/27/2014  . GERD (gastroesophageal reflux disease) 03/15/2013  . Right shoulder pain 11/30/2012  . Chronic kidney disease (CKD), stage III (moderate) ([place]) 10/18/2012  . Hyperkalemia 10/18/2012  . Osteoporosis 10/18/2012  . History of endometrial cancer 10/18/2012  . Chronic back pain 10/18/2012  . Hypertension 10/16/2012  . EDEMA 10/30/2009  . Hypercholesterolemia 09/22/2009  . Chest pain 09/08/2009  . Chronic diastolic CHF (congestive heart failure) ([place]) 07/07/2009  . Shortness of breath 07/07/2009    Past Surgical History:  Procedure Laterality Date  . AQUEOUS SHUNT Left 01/30/2017   Procedure: AQUEOUS SHUNT ahmed tube;  Surgeon: Ronnell Freshwater, MD;  Location: [place];  Service: Ophthalmology;  Laterality: Left;  ahmed tube shunt with scleral patch graft  . CARDIAC CATHETERIZATION     Left  . CARDIOVERSION N/A 08/06/2018   Procedure: CARDIOVERSION;  Surgeon: Wellington Hampshire, MD;  Location: [facility];  Service: Cardiovascular;  Laterality: N/A;  . CARDIOVERSION N/A 08/24/2018   Procedure: CARDIOVERSION;  Surgeon: Minna Merritts, MD;  Location: [facility];  Service:  Cardiovascular;  Laterality: N/A;  . CATARACT EXTRACTION Right 07/03/2013   [facility] Dr Wallace Going  . CATARACT EXTRACTION W/PHACO Left 01/30/2017   Procedure: CATARACT EXTRACTION PHACO AND INTRAOCULAR LENS PLACEMENT ([place])  left;  Surgeon: Ronnell Freshwater, MD;  Location: [place];  Service: Ophthalmology;  Laterality: Left;  . REFRACTIVE SURGERY    . TEE WITHOUT CARDIOVERSION N/A 08/06/2018   Procedure: TRANSESOPHAGEAL  ECHOCARDIOGRAM (TEE);  Surgeon: Wellington Hampshire, MD;  Location: [facility];  Service: Cardiovascular;  Laterality: N/A;  . TOTAL ABDOMINAL HYSTERECTOMY W/ BILATERAL SALPINGOOPHORECTOMY     stage I C well differentiated adenocarcinoma    Prior to Admission medications   Medication Sig Start Date End Date Taking? Authorizing Provider  Acetaminophen (TYLENOL PO) Take 1 tablet by mouth as needed (PAIN).    Yes [provider]  amiodarone (PACERONE) 200 MG tablet Take 1 tablet (200 mg total) by mouth daily. 08/29/18  Yes Gollan, Kathlene November, MD  B Complex Vitamins (PA B-COMPLEX WITH B-12) TABS Take 1 tablet by mouth daily.     Yes [provider]  Calcium Carbonate-Vitamin D (CALCIUM-VITAMIN D) 600-200 MG-UNIT CAPS Take 0.5 mg by mouth daily. Takes 1/2 tablet daily.    Yes [provider]  docusate sodium (STOOL SOFTENER) 100 MG capsule Take 100 mg by mouth daily as needed for mild constipation.   Yes [provider]  ELIQUIS 2.5 MG TABS tablet TAKE ONE TABLET BY MOUTH TWICE DAILY Patient taking differently: Take 2.5 mg by mouth 2 (two) times daily.  09/07/18  Yes Gollan, Kathlene November, MD  furosemide (LASIX) 20 MG tablet Take 1 tablet (20 mg total) by mouth daily. 09/20/18 12/19/18 Yes Minna Merritts, MD  Glucosamine-Chondroitin 1500-1200 MG/30ML LIQD Take 0.5 tablets by mouth daily. Takes 1/2 tablet daily.   Yes [provider]  hydrALAZINE (APRESOLINE) 100 MG tablet Take 1 tablet (100 mg total) by mouth 3 (three) times daily. 09/21/18 12/20/18 Yes Gollan, Kathlene November, MD  losartan (COZAAR) 100 MG tablet Take 1 tablet (100 mg total) by mouth daily. 08/29/18  Yes Minna Merritts, MD  Magnesium 250 MG TABS Take 250 mg by mouth daily.   Yes [provider]  metoprolol succinate (TOPROL XL) 25 MG 24 hr tablet Take 1 tablet (25 mg total) by mouth daily. 09/05/18  Yes Minna Merritts, MD  multivitamin [facility]) per tablet Take 1 tablet by mouth daily.      Yes [provider]  nystatin (NYSTATIN) powder Apply 1 g topically 2 (two) times daily as needed (rash).   Yes [provider]  Polyethylene Glycol 3350 (MIRALAX PO) Take by mouth daily.    Yes [provider]  ranitidine (ZANTAC) 150 MG tablet Take 150 mg by mouth 2 (two) times daily.  05/01/17  Yes [provider]  simvastatin (ZOCOR) 10 MG tablet TAKE ONE TABLET BY MOUTH AT BEDTIME Patient taking differently: Take 10 mg by mouth daily at 6 PM.  09/06/18  Yes Gollan, Kathlene November, MD    Allergies Clonidine; Benicar [olmesartan]; Celebrex [celecoxib]; Iodine; and Penicillins  Family History  Problem Relation Age of Onset  . Heart disease Father   . Heart disease Mother   . Parkinson's disease Mother   . Diabetes Cousin     Social History Social History   Tobacco Use  . Smoking status: Never Smoker  . Smokeless tobacco: Never Used  Substance Use Topics  . Alcohol use: No    Alcohol/week: 0.0 standard  drinks  . Drug use: No    Review of Systems  Constitutional: No fever/chills Eyes: No visual changes. ENT: No sore throat. Cardiovascular: Denies chest pain. Respiratory: Denies shortness of breath. Gastrointestinal: No abdominal pain.  No nausea, no vomiting.  No diarrhea.  No constipation. Genitourinary: Negative for dysuria. Musculoskeletal: Negative for back pain. Skin: Negative for rash. Neurological: Negative for headaches, focal weakness or numbness.   ____________________________________________   PHYSICAL EXAM:  VITAL SIGNS: ED Triage Vitals  Enc Vitals Group     BP 09/28/18 1411 (!) 185/97     Pulse Rate 09/28/18 1411 (!) 56     Resp 09/28/18 1411 16     Temp 09/28/18 1411 (!) 97.5 F (36.4 C)     Temp Source 09/28/18 1411 Oral     SpO2 09/28/18 1411 92 %     Weight 09/28/18 1413 129 lb 9.6 oz (58.8 kg)     Height 09/28/18 1413 5\' 4"  (1.626 m)     Head Circumference --      Peak Flow --      Pain Score 09/28/18 1413  0     Pain Loc --      Pain Edu? --      Excl. in [place]? --     Constitutional: Alert and oriented. Well appearing and in no acute distress. Eyes: Conjunctivae are normal.  Head: Atraumatic. Nose: No congestion/rhinnorhea. Mouth/Throat: Mucous membranes are moist.  Neck: No stridor.   Cardiovascular: Normal rate, regular rhythm. Grossly normal heart sounds.  Good peripheral circulation with equal and bilateral dorsalis pedis pulses. Respiratory: Normal respiratory effort.  No retractions. Lungs CTAB. Gastrointestinal: Soft and nontender. No distention. No CVA tenderness. Musculoskeletal: No lower tenderness nor edema.  No joint effusions.  No limb shortening.  With active range of motion the patient has pain to the proximal and medial right lower extremity and groin.  No deformity noted.  No ecchymosis.  Mild to moderate left lower extremity edema without any erythema or warmth. Neurologic:  Normal speech and language. No gross focal neurologic deficits are appreciated. Skin:  Skin is warm, dry and intact. No rash noted. Psychiatric: Mood and affect are normal. Speech and behavior are normal.  ____________________________________________   LABS (all labs ordered are listed, but only abnormal results are displayed)  Labs Reviewed  CBC WITH DIFFERENTIAL/PLATELET - Abnormal; Notable for the following components:      Result Value   WBC 11.0 (*)    Neutro Abs 8.4 (*)    Monocytes Absolute 1.2 (*)    All other components within normal limits  BASIC METABOLIC PANEL - Abnormal; Notable for the following components:   Sodium 133 (*)    Chloride 97 (*)    Glucose, Bld 119 (*)    BUN 40 (*)    Creatinine, Ser 1.86 (*)    GFR calc non Af Amer 23 (*)    GFR calc Af Amer 27 (*)    All other components within normal limits    ____________________________________________  EKG   ____________________________________________  RADIOLOGY   ____________________________________________   PROCEDURES  Procedure(s) performed:   Procedures  Critical Care performed:   ____________________________________________   INITIAL IMPRESSION / ASSESSMENT AND PLAN / ED COURSE  Pertinent labs & imaging results that were available during my care of the patient were reviewed by me and considered in my medical decision making (see chart for details).  DDX: Groin strain, muscle strain, ligamentous injury, bursitis, hip fracture, pelvis fracture As  part of my medical decision making, I reviewed the following data within the electronic MEDICAL RECORD NUMBER Notes from prior ED visits  ----------------------------------------- 3:10 PM on 09/28/2018 -----------------------------------------  Patient at this time pending CT of the pelvis.  Signed out to Dr. Sullivan Lone. ____________________________________________   FINAL CLINICAL IMPRESSION(S) / ED DIAGNOSES  Right groin and hip pain.  NEW MEDICATIONS STARTED DURING THIS VISIT:  New Prescriptions   No medications on file     Note:  This document was prepared using Dragon voice recognition software and may include unintentional dictation errors.     Orbie Pyo, MD 09/28/18 [phone]

## 2018-09-28 NOTE — ED Notes (Signed)
Patient given sandwich tray and milk

## 2018-09-28 NOTE — ED Notes (Signed)
Patient ambulated with walker and assistance of this RN.

## 2018-09-28 NOTE — Telephone Encounter (Signed)
Pt's son Carlis Abbott calling asking if pt could be admitted to the hospital due to severe pain in her right groin. Pt's son states that the pt does not want to go to the ED and wants to know if there is a way she can just be admitted to tthe hospital.Pt's son states he was contacted by Mardene Celeste, a staff member at [facility] facility and was told that the pt was not able to move due to pain in hip and groin and can not walk to the bathroom unassisted. Spoke with the pt who states she has been experiencing the pain for about a week. Pt states she was taken to the hospital on 09/13/18 after a fall in which she fell backwards and hit her head. Pt states that while she was in the ED an xray was not done on her hip. Pt states when she sits down she does not feel the pain but when she attempts to stand up or walk the pain increases to 8-9. Pt and pt's son advised that the pt needed to be seen in the ED for current symptoms. Understanding verbalized and pt's son asking if the pt could be taken to the ED by the caregiver's vehicle. Pt's son advised that if the pt is unable to walk using the ambulance would be the best source of transportation to get to the ED. Understanding verbalized. Reason for Disposition . Can't stand (bear weight) or walk  Answer Assessment - Initial Assessment Questions 1. LOCATION and RADIATION: "Where is the pain located?"      Right groin ara 2. QUALITY: "What does the pain feel like?"  (e.g., sharp, dull, aching, burning)     Description of pain not given 3. SEVERITY: "How bad is the pain?" "What does it keep you from doing?"   (Scale 1-10; or mild, moderate, severe)   -  MILD (1-3): doesn't interfere with normal activities    -  MODERATE (4-7): interferes with normal activities (e.g., work or school) or awakens from sleep, limping    -  SEVERE (8-10): excruciating pain, unable to do any normal activities, unable to walk     8-9 4. ONSET: "When did the pain start?" "Does  it come and go, or is it there all the time?"     About a week ago, pt states she fell backwards and went to the ED on 09/13/18 5. WORK OR EXERCISE: "Has there been any recent work or exercise that involved this part of the body?"      Fall on 09/13/18 6. CAUSE: "What do you think is causing the hip pain?"      Recent fall on 09/13/18 7. AGGRAVATING FACTORS: "What makes the hip pain worse?" (e.g., walking, climbing stairs, running)     Walking and standing 8. OTHER SYMPTOMS: "Do you have any other symptoms?" (e.g., back pain, pain shooting down leg,  fever, rash)     Pain shooting down leg into groin area  Protocols used: HIP PAIN-A-AH

## 2018-09-28 NOTE — Telephone Encounter (Signed)
Patient is currently at the ED. 

## 2018-09-28 NOTE — Clinical Social Work Note (Signed)
CSW contacted by ED MD regarding patient. Per MD patient needs SNF placement. CSW explained that this can be done from the ED but would need PT assessment and authorization which will not be obtained today. MD states that is fine and to begin process. CSW initiate bed search. CSW will follow for discharge planning.    [place], [place]

## 2018-09-29 NOTE — Clinical Social Work Note (Signed)
Patient's son called CSW and stated that after speaking with his sister they do wish to pursue the short term rehab. He understands that the auth may not come in until late Monday or Tuesday. CSW informed him that the only bed offer thus far is from [facility]. He stated he preferred [facility] but he understands that they have not offered to take her as of yet. CSW explained that we would contact [place] to begin the auth process with [employer].  Shela Leff MSW,LCSW [phone]

## 2018-09-29 NOTE — Plan of Care (Signed)
Patient will complete listed goals to return to home mobility.

## 2018-09-29 NOTE — ED Provider Notes (Signed)
-----------------------------------------   7:57 AM on 09/29/2018 -----------------------------------------   Blood pressure (!) 152/87, pulse 70, temperature (!) 97.5 F (36.4 C), temperature source Oral, resp. rate 18, height 5\' 4"  (1.626 m), weight 58.8 kg, SpO2 96 %.  The patient had no acute events since last update.  Calm and cooperative at this time.  The patient has multiple nonoperative pelvic fractures and is weightbearing as tolerated.  She is pending physical therapy and social work evaluations likely for rehabilitation versus assisted living.   Darel Hong, MD 09/29/18 [phone]

## 2018-09-29 NOTE — ED Notes (Signed)
Pt sleeping, meal tray at bedside.

## 2018-09-29 NOTE — ED Notes (Signed)
Patient is resting comfortably. 

## 2018-09-29 NOTE — Evaluation (Signed)
Physical Therapy Evaluation Patient Details Name: Jill Castro MRN: [id number] DOB: 1927/02/24 Today's Date: 09/29/2018   History of Present Illness  Patient is a pleasant 84 y/o female that presents to the emergency room with gradual onset of R hip/pelvic pain with ambulation after fall several weeks ago. She is noted to have sub-acute pelvic fractures.   Clinical Impression  Patient is a pleasant 84 y/o female that presents after mechanical fall several weeks with progressive increase in R groin pain. She was found to have R sacral ala, R pubic symphisis, and R inferior pubic ramus fractures in the sub-acute stage. She has typically utilized a 3 wheeled walker and has been able to perform limited community mobility, however in this session she requires assistance for bed mobility and transfers, and is unable to ambulate due to R groin pain. She is unable to sufficiently weightbear on RLE to advance LLE and is unable to advance RLE independently. Given her stark change in mobility, would recommend short term rehabilitation to improve her mobility.     Follow Up Recommendations SNF    Equipment Recommendations  Rolling walker with 5" wheels    Recommendations for Other Services       Precautions / Restrictions Precautions Precautions: Fall Restrictions Weight Bearing Restrictions: Yes RLE Weight Bearing: Weight bearing as tolerated LLE Weight Bearing: Weight bearing as tolerated      Mobility  Bed Mobility Overal bed mobility: Needs Assistance Bed Mobility: Supine to Sit;Sit to Supine     Supine to sit: Min assist Sit to supine: Min assist;Mod assist   General bed mobility comments: Patient has difficulty maneuvering her RLE and requires assistance to manage torso.   Transfers Overall transfer level: Needs assistance Equipment used: Rolling walker (2 wheeled) Transfers: Sit to/from Stand Sit to Stand: Mod assist         General transfer comment: Patient has  difficulty with bearing weight on RLE secondary to pain.   Ambulation/Gait           Gait velocity interpretation: <1.31 ft/sec, indicative of household ambulator General Gait Details: Attempted steps, patient unable to bear weight on RLE to allow attempt at gait, able to tap laterally, but unable to take a step.   Stairs            Wheelchair Mobility    Modified Rankin (Stroke Patients Only)       Balance Overall balance assessment: Needs assistance;History of Falls Sitting-balance support: No upper extremity supported;Feet supported Sitting balance-Leahy Scale: Good     Standing balance support: Bilateral upper extremity supported Standing balance-Leahy Scale: Poor                               Pertinent Vitals/Pain Pain Assessment: Faces Faces Pain Scale: Hurts whole lot Pain Location: R groin  Pain Descriptors / Indicators: Aching;Discomfort Pain Intervention(s): Limited activity within patient's tolerance;Repositioned    Home Living Family/patient expects to be discharged to:: Private residence Living Arrangements: Alone Available Help at Discharge: Friend(s);Available PRN/intermittently Type of Home: Independent living facility Home Access: Level entry     Home Layout: One level Home Equipment: Grab bars - tub/shower(3 wheeled walker with seat) Additional Comments: Pt reports that home is handicap accessible    Prior Function Level of Independence: Independent with assistive device(s)         Comments: Pt ambulates without AD at times in her home.  Otherwise uses her 3 wheeled  walker.  Was ambulating at ILF as well as would go out to restaurants and have no difficulty ambulating shorter distances in community.  One fall in the past 6 months.  Showers and dresses independently.      Hand Dominance        Extremity/Trunk Assessment   Upper Extremity Assessment Upper Extremity Assessment: Generalized weakness    Lower Extremity  Assessment Lower Extremity Assessment: RLE deficits/detail RLE Deficits / Details: Unable to flex R hip or extend R knee against gravity.  RLE: Unable to fully assess due to pain       Communication   Communication: HOH  Cognition Arousal/Alertness: Awake/alert Behavior During Therapy: WFL for tasks assessed/performed Overall Cognitive Status: Within Functional Limits for tasks assessed                                        General Comments General comments (skin integrity, edema, etc.): Dried blood on scalp    Exercises     Assessment/Plan    PT Assessment Patient needs continued PT services  PT Problem List Decreased strength;Decreased range of motion;Decreased activity tolerance;Decreased balance;Decreased mobility;Decreased safety awareness;Decreased knowledge of precautions;Pain       PT Treatment Interventions DME instruction;Therapeutic activities;Gait training;Therapeutic exercise;Patient/family education    PT Goals (Current goals can be found in the Care Plan section)  Acute Rehab PT Goals Patient Stated Goal: To return home when able.  PT Goal Formulation: With patient Time For Goal Achievement: 10/13/18 Potential to Achieve Goals: Good    Frequency 7X/week   Barriers to discharge Inaccessible home environment;Decreased caregiver support      Co-evaluation               AM-PAC PT "6 Clicks" Daily Activity  Outcome Measure Difficulty turning over in bed (including adjusting bedclothes, sheets and blankets)?: A Little Difficulty moving from lying on back to sitting on the side of the bed? : A Little Difficulty sitting down on and standing up from a chair with arms (e.g., wheelchair, bedside commode, etc,.)?: A Lot Help needed moving to and from a bed to chair (including a wheelchair)?: A Lot Help needed walking in hospital room?: Total Help needed climbing 3-5 steps with a railing? : Total 6 Click Score: 12    End of Session  Equipment Utilized During Treatment: Gait belt Activity Tolerance: Patient limited by pain Patient left: in bed;with bed alarm set;with call bell/phone within reach Nurse Communication: Mobility status PT Visit Diagnosis: History of falling (Z91.81);Muscle weakness (generalized) (M62.81);Difficulty in walking, not elsewhere classified (R26.2)    Time: 3428-7681 PT Time Calculation (min) (ACUTE ONLY): 18 min   Charges:   PT Evaluation $PT Eval Moderate Complexity: 1 Mod      Royce Macadamia PT, DPT, CSCS       09/29/2018, 1:01 PM

## 2018-09-29 NOTE — ED Notes (Signed)
Pt given breakfast tray and cup of decaf coffee.  Denies any other needs at this time Continue to monitor.

## 2018-09-29 NOTE — Clinical Social Work Note (Signed)
Patient's son returned CSW call. CSW explained the discharge options to patient's son several times. Patient's son was seeming to have a difficult time understanding what decision was to be made. CSW spent time with patient's son repeating his options in detail. Patient's son is aware and verbalized that he understands that his options are as follows: return home with 24/7 care and home health, pursue short term rehab knowing that she would remain in the ED until auth could be obtained from insurance company (which could be a 2-3 additional days in the ED), or consider paying out of pocket to potentially facilitate getting her to [facility] this weekend. Patient's son is going to speak with his sister and then call CSW back with a decision. Shela Leff MSW,LCSW [phone]

## 2018-09-29 NOTE — ED Notes (Signed)
PT at bedside.

## 2018-09-29 NOTE — Clinical Social Work Note (Signed)
Patient has not yet been assessed by PT. Will await PT recommendations. Shela Leff MSW,LCSW [phone]

## 2018-09-29 NOTE — Clinical Social Work Note (Addendum)
CSW contacted PT and spoke with Fraser Din on the phone about 12:30 today. Fraser Din states that he is going to recommend short term rehab. CSW visited with patient and her aid from a personal care agency ([facility]) at bedside this afternoon. Patient is very hard of hearing and does not have her batteries charged in her hearing aids. CSW attempted to explain the PT recommendations. Patient has 24/7 care available at her [facility] apartment. The aid informed CSW that the contact person for patient is her son: Gean Laursen: [phone]. CSW attempted to reach Mr. Aguado to explain the discharge options.  Shela Leff MSW,LCSW [phone]

## 2018-09-29 NOTE — ED Notes (Signed)
Pt wants to stand to go to the toilet. Pt has pelvic fractures. Advised pt we should use a bed pan until PT comes to evaluate the extent she should bear weight and ambulate. Pt verbalized understanding.

## 2018-09-29 NOTE — ED Notes (Signed)
Pt assisted to bedpan to try to have BM, pt reports only gas at this time.  Pt turned onto left side, and protective dressing applied to sacrum for protection. No s/s of skin breakdown at this time.  Pt ate dinner already. VSS, continue to monitor.

## 2018-09-29 NOTE — ED Notes (Signed)
Pt adjusted in bed at this time. No other needs expressed by pt at this time to this RN.

## 2018-09-29 NOTE — ED Notes (Signed)
This tech checked in with pt, states she is fine and is resting comfortably in bed.

## 2018-09-29 NOTE — ED Notes (Addendum)
In to speak with pt. External catheter checked before pt urinated. Pt given water todrink. VSS. Pt reports sleeping well. External catheter working well. Pt given fresh ice water per request.

## 2018-09-30 ENCOUNTER — Emergency Department: Payer: Medicare Other

## 2018-09-30 MED ORDER — DOCUSATE SODIUM 100 MG PO CAPS: 100.0000 mg | ORAL_CAPSULE | Freq: Every day | ORAL | Status: DC

## 2018-09-30 MED ORDER — ACETAMINOPHEN 500 MG PO TABS
1000.0000 mg | ORAL_TABLET | Freq: Once | ORAL | Status: AC
  Administered 2018-09-30: 1000 mg via ORAL
  Filled 2018-09-30: qty 2

## 2018-09-30 MED ORDER — DOCUSATE SODIUM 100 MG PO CAPS
100.0000 mg | ORAL_CAPSULE | Freq: Two times a day (BID) | ORAL | Status: DC | PRN
  Administered 2018-09-30: 100 mg via ORAL
  Filled 2018-09-30: qty 1

## 2018-09-30 NOTE — ED Notes (Signed)
Pt given breakfast and coffee.  Pulled up in bed and repositioned. No other needs at this time.

## 2018-09-30 NOTE — Care Management (Signed)
RNCM was notified by PT that they would like to change their recommendation to HHPT. Spoke with patient and son Carlis Abbott at length. We had a phone call on speak so that all three of [place] could speak via speaker phone. Patient and family both feel like after the physical therapy session that her mobility is quite limited and that they feel unsafe with the patient going home tonight without anyone in the home with her. I have provide a list of Home health agencies in addition to personal care services should this be something of interest. At this time both the son and the patient would like to wait until insurance authorization has concluded to see if the patient could do shirt term rehab. Social work had began workup for disposition to [place] care. RNCM team will continue to follow to assist with transition of care plans.   Ines Bloomer RN BSN RNCM [phone]

## 2018-09-30 NOTE — ED Notes (Signed)
Pt cleaned from bowel movement. Other than small bowel movement now has not had one this visit.  Stool softener ordered.

## 2018-09-30 NOTE — ED Notes (Signed)
Pt canister of urine emptied. 121ml output

## 2018-09-30 NOTE — ED Notes (Signed)
Pt repositioned in bed at this time by EDT.

## 2018-09-30 NOTE — ED Notes (Signed)
Pt adjusted in bed by this RN and Sarita Haver.

## 2018-09-30 NOTE — ED Notes (Signed)
PT consult at bedside

## 2018-09-30 NOTE — ED Provider Notes (Signed)
-----------------------------------------   6:43 AM on 09/30/2018 -----------------------------------------   Blood pressure (!) 147/75, pulse 64, temperature (!) 97.4 F (36.3 C), temperature source Oral, resp. rate 16, height 5\' 4"  (1.626 m), weight 58.8 kg, SpO2 96 %.  The patient had no acute events since last update.  Calm and cooperative at this time.  Disposition is pending social work team recommendations.    Arta Silence, MD 09/30/18 [phone]

## 2018-09-30 NOTE — ED Notes (Signed)
Pt sleeping. 

## 2018-09-30 NOTE — ED Notes (Signed)
Pt given dinner tray. NAD. No other needs at this time. Unlabored.

## 2018-09-30 NOTE — ED Notes (Signed)
Pt given lunch tray. Sitting up and positioned in bed to eat. No other needs at this time.

## 2018-09-30 NOTE — ED Notes (Signed)
Breakfast tray given to RN

## 2018-09-30 NOTE — ED Notes (Signed)
Pt stated to this RN that she has not had a BM x4 days. MD Siadecki aware and orders placed.

## 2018-09-30 NOTE — ED Notes (Signed)
Pt ate half her sandwich and grapes that came with lunch tray.

## 2018-09-30 NOTE — ED Notes (Signed)
Pt reports she is not hungry and does not want food.

## 2018-09-30 NOTE — Progress Notes (Signed)
Physical Therapy Treatment Patient Details Name: Jill Castro MRN: [id number] DOB: 05-12-1927 Today's Date: 09/30/2018    History of Present Illness Patient is an 84 year old female admitted s/p fall for a pelvic pain and LE swelling. Imaging revealed "recent fractures of the right sacral ala, right pubic symphysis".  PMH includes HOH, macular degeneration, osteoporosis and scoliosis.    PT Comments    Patient demonstrated progress today with STS transfers and ambulation.  She was A&O though tearful at times during treatment.  PT provided min A for bed mobility and STS transfer.  Pt was open to education regarding use of RW and gait mechanics, demonstrating progress with STS and gait with practice.  Pt did report pain in R groin area but was able to complete most there ex without manual assistance.  Pt able to perform LAQ's and all there ex involving hip musculature with exception of SLR.  Pt requested that PT contact her son and PT has contacted him to update pt on the change in plan of care.   Pt will continue to benefit from skilled PT with focus on strength, tolerance to activity, pain management and fall prevention and return to safe functional activity.   Follow Up Recommendations  Home health PT;Supervision for mobility/OOB     Equipment Recommendations  Rolling walker with 5" wheels    Recommendations for Other Services       Precautions / Restrictions Precautions Precautions: Fall Restrictions Weight Bearing Restrictions: Yes RLE Weight Bearing: Weight bearing as tolerated LLE Weight Bearing: Weight bearing as tolerated    Mobility  Bed Mobility Overal bed mobility: Needs Assistance Bed Mobility: Supine to Sit;Sit to Supine     Supine to sit: Min assist Sit to supine: Min assist   General bed mobility comments: Able to sit up after PT initiating movement of R LE.  Also assisted pt in lifting R LE over EOB when returning to bed.  Transfers Overall transfer  level: Needs assistance Equipment used: Rolling walker (2 wheeled) Transfers: Sit to/from Stand Sit to Stand: Min assist         General transfer comment: Able to stand without bed elevated second time.  VC's for hand placement and body mechanics.   Pt better able to stand with each repetition.  Ambulation/Gait Ambulation/Gait assistance: Min assist Gait Distance (Feet): 8 Feet Assistive device: Rolling walker (2 wheeled)     Gait velocity interpretation: <1.31 ft/sec, indicative of household ambulator General Gait Details: Very low foot clearance initially.  Pt was more confident after PT provided RW training, encouraging pt to WB on UE's with RW when offloading the R LE.  PT also lowered RW and educated pt concerning appropriate RW height.  Pt took rest break between each step but demonstrated better foot clearance with last 4-5 steps.   Stairs             Wheelchair Mobility    Modified Rankin (Stroke Patients Only)       Balance Overall balance assessment: Needs assistance;History of Falls Sitting-balance support: No upper extremity supported;Feet supported Sitting balance-Leahy Scale: Good     Standing balance support: Bilateral upper extremity supported Standing balance-Leahy Scale: Poor                              Cognition Arousal/Alertness: Awake/alert Behavior During Therapy: WFL for tasks assessed/performed Overall Cognitive Status: Within Functional Limits for tasks assessed  General Comments: Pleasant but tearful at times.  Follows commands consistently.      Exercises General Exercises - Lower Extremity Ankle Circles/Pumps: 20 reps;AROM;Strengthening;Both;Seated Quad Sets: 10 reps;Seated;Both;Strengthening Long Arc Quad: 10 reps;Both;Seated(with pain in R groin.) Hip Flexion/Marching: Both;20 reps;Seated;Strengthening    General Comments        Pertinent Vitals/Pain Pain  Assessment: Faces Faces Pain Scale: Hurts even more Pain Location: R groin with hip flexion Pain Intervention(s): Limited activity within patient's tolerance    Home Living                      Prior Function            PT Goals (current goals can now be found in the care plan section) Acute Rehab PT Goals Patient Stated Goal: To return home when able.  PT Goal Formulation: With patient Time For Goal Achievement: 10/13/18 Potential to Achieve Goals: Good Progress towards PT goals: Progressing toward goals    Frequency    7X/week      PT Plan Discharge plan needs to be updated    Co-evaluation              AM-PAC PT "6 Clicks" Daily Activity  Outcome Measure  Difficulty turning over in bed (including adjusting bedclothes, sheets and blankets)?: A Little Difficulty moving from lying on back to sitting on the side of the bed? : A Little Difficulty sitting down on and standing up from a chair with arms (e.g., wheelchair, bedside commode, etc,.)?: A Little Help needed moving to and from a bed to chair (including a wheelchair)?: A Lot Help needed walking in hospital room?: A Lot Help needed climbing 3-5 steps with a railing? : Total 6 Click Score: 14    End of Session Equipment Utilized During Treatment: Gait belt Activity Tolerance: Patient limited by pain Patient left: in bed;with bed alarm set;with call bell/phone within reach Nurse Communication: Mobility status PT Visit Diagnosis: History of falling (Z91.81);Muscle weakness (generalized) (M62.81);Difficulty in walking, not elsewhere classified (R26.2)     Time: 6945-0388 PT Time Calculation (min) (ACUTE ONLY): 33 min  Charges:  $Gait Training: 8-22 mins $Therapeutic Exercise: 8-22 mins                     Roxanne Gates, PT, DPT    Roxanne Gates 09/30/2018, 3:40 PM

## 2018-09-30 NOTE — ED Notes (Signed)
Pt tearful upon entrance to room. She asked that I dial her son and help her use the phone, but does not explain what is upsetting her. Son dialed and message left. Pt encouraged to eat lunch, but does not want any.

## 2018-09-30 NOTE — ED Notes (Signed)
Lunch tray left with RN

## 2018-09-30 NOTE — Clinical Social Work Note (Addendum)
CSW attempted to call patient's son Kaisyn Reinhold, [phone] to discuss change in PT's recommendation to home health.  CSW left a message awaiting on call back from patient's son.    5:10pm  CSW spoke with case manager who spoke with son, he still would like CSW to pursue short term rehab at [facility].  CSW will continue to follow, awaiting insurance authorization.  Evette Cristal, MSW, [facility] ED Covering CSW [phone] 09/30/2018 5:02 PM

## 2018-10-01 LAB — CBC
HCT: 34.7 % — ABNORMAL LOW (ref 36.0–46.0)
Hemoglobin: 11.3 g/dL — ABNORMAL LOW (ref 12.0–15.0)
MCH: 30.4 pg (ref 26.0–34.0)
MCHC: 32.6 g/dL (ref 30.0–36.0)
MCV: 93.3 fL (ref 80.0–100.0)
Platelets: 242 10*3/uL (ref 150–400)
RBC: 3.72 MIL/uL — ABNORMAL LOW (ref 3.87–5.11)
RDW: 14.9 % (ref 11.5–15.5)
WBC: 7.2 10*3/uL (ref 4.0–10.5)
nRBC: 0 % (ref 0.0–0.2)

## 2018-10-01 LAB — CREATININE, SERUM
Creatinine, Ser: 1.36 mg/dL — ABNORMAL HIGH (ref 0.44–1.00)
GFR calc Af Amer: 39 mL/min — ABNORMAL LOW (ref >60)
GFR calc non Af Amer: 33 mL/min — ABNORMAL LOW (ref >60)

## 2018-10-01 MED ORDER — ACETAMINOPHEN 325 MG PO TABS
ORAL_TABLET | ORAL | Status: AC
  Filled 2018-10-01: qty 2

## 2018-10-01 MED ORDER — ACETAMINOPHEN 325 MG PO TABS
650.0000 mg | ORAL_TABLET | Freq: Once | ORAL | Status: AC
  Administered 2018-10-01: 650 mg via ORAL

## 2018-10-01 NOTE — Clinical Social Work Note (Addendum)
11:30am  CSW spoke to patient's son Mee Hives, [phone] and updated him that SNF is still waiting for insurance authorization.  CSW explained to patient's son what to expect at SNF, and what the process is for looking for placement.  CSW also explained how insurance will pay for stay at SNF.  Patient's son asked how patient would be able to get to SNF, CSW informed him that patient's can go via EMS and bill insurance of if family are able to transport that is the other option.  Patient's son stated that he lives in [place], and is unable to transport patient, He requested to use EMS transport once patient is ready for discharge and Josem Kaufmann has been received.  CSW to continue to follow patient's progress throughout discharge planning.  4:00pm   CSW contacted [facility], insurance authorization is still pending, SNF stated they have contacted insurance company to request faster authorization, still waiting for approval.  4:50pm   CSW updated patient's son Carlis Abbott that SNF is still waiting for insurance authorization.  5:45pm  CSW updated patient that SNF is still waiting for insurance authorization, CSW updated patient's son, CSW to continue to follow patient's progress throughout discharge planning.  Evette Cristal, MSW, [facility] ED Covering CSW [phone] 10/01/2018 5:48 PM

## 2018-10-01 NOTE — ED Notes (Signed)
Jill Castro Education officer, museum at [employer] pt on plan of care at this time

## 2018-10-01 NOTE — ED Notes (Signed)
PT at bedside with pt at this time 

## 2018-10-01 NOTE — Progress Notes (Signed)
Physical Therapy Treatment Patient Details Name: Jill Castro MRN: [id number] DOB: 1927-03-16 Today's Date: 10/01/2018    History of Present Illness Patient is an 84 year old female admitted s/p fall for a pelvic pain and LE swelling. Imaging revealed "recent fractures of the right sacral ala, right pubic symphysis".  PMH includes HOH, macular degeneration, osteoporosis and scoliosis.    PT Comments    Pt agreeable to PT; reports moderate amount of pain in R groin with movement. Pt participates in edge of the bed sitting and seated and supine exercises with assist as needed. Responds well to slow careful movement with rest between sets. Pt declined standing at this time. Continue PT to progress strength, endurance to improve all functional mobility.     Follow Up Recommendations  Home health PT;Supervision for mobility/OOB     Equipment Recommendations  Rolling walker with 5" wheels    Recommendations for Other Services       Precautions / Restrictions Precautions Precautions: Fall Restrictions Weight Bearing Restrictions: Yes RLE Weight Bearing: Weight bearing as tolerated LLE Weight Bearing: Weight bearing as tolerated    Mobility  Bed Mobility Overal bed mobility: Needs Assistance Bed Mobility: Supine to Sit;Sit to Supine     Supine to sit: Min guard Sit to supine: Min assist   General bed mobility comments: Assist for LEs; manages upper body well  Transfers                 General transfer comment: Pt declined   Ambulation/Gait                 Stairs             Wheelchair Mobility    Modified Rankin (Stroke Patients Only)       Balance Overall balance assessment: Needs assistance Sitting-balance support: Bilateral upper extremity supported;Feet supported Sitting balance-Leahy Scale: Good                                      Cognition Arousal/Alertness: Awake/alert Behavior During Therapy: WFL for tasks  assessed/performed Overall Cognitive Status: Within Functional Limits for tasks assessed                                        Exercises General Exercises - Lower Extremity Quad Sets: Strengthening;Both;20 reps;Supine Gluteal Sets: Strengthening;Both;20 reps;Supine Long Arc Quad: AROM;Both;10 reps;Seated(2 sets) Heel Slides: AAROM;Both;20 reps;Supine Hip ABduction/ADduction: AAROM;Both;10 reps;Seated;Other (comment)(2 sets) Hip Flexion/Marching: AROM;Both;10 reps;Seated(2 sets; performed single side) Toe Raises: AROM;Both;20 reps;Seated Heel Raises: AROM;Both;20 reps;Seated    General Comments        Pertinent Vitals/Pain Pain Assessment: Faces Faces Pain Scale: Hurts even more Pain Location: R groin with movement Pain Intervention(s): Monitored during session    Home Living                      Prior Function            PT Goals (current goals can now be found in the care plan section) Progress towards PT goals: Progressing toward goals    Frequency    7X/week      PT Plan Current plan remains appropriate    Co-evaluation              AM-PAC PT "6 Clicks" Daily  Activity  Outcome Measure  Difficulty turning over in bed (including adjusting bedclothes, sheets and blankets)?: A Little Difficulty moving from lying on back to sitting on the side of the bed? : A Little Difficulty sitting down on and standing up from a chair with arms (e.g., wheelchair, bedside commode, etc,.)?: Unable Help needed moving to and from a bed to chair (including a wheelchair)?: A Lot Help needed walking in hospital room?: A Lot Help needed climbing 3-5 steps with a railing? : Total 6 Click Score: 12    End of Session   Activity Tolerance: Patient limited by pain Patient left: in bed;with call bell/phone within reach;with bed alarm set   PT Visit Diagnosis: History of falling (Z91.81);Muscle weakness (generalized) (M62.81);Difficulty in walking, not  elsewhere classified (R26.2)     Time: 1500-1530 PT Time Calculation (min) (ACUTE ONLY): 30 min  Charges:  $Therapeutic Exercise: 8-22 mins $Therapeutic Activity: 8-22 mins                      Larae Grooms, PTA 10/01/2018, 4:47 PM

## 2018-10-01 NOTE — ED Notes (Signed)
Report given to Andrea RN

## 2018-10-01 NOTE — ED Notes (Signed)
Pt given meal tray at this time 

## 2018-10-01 NOTE — ED Notes (Signed)
Pt speaking with son at this time. Social worker contacted and answered all questions he had per Baldemar Lenis

## 2018-10-01 NOTE — ED Notes (Addendum)
Pt given bed bath and clean gown applied. Linens changed and new external catheter and tubing changed and  placed. Pt given dinner tray at this time. Pt resting comfortably in bed at this time.

## 2018-10-01 NOTE — ED Notes (Signed)
Pt given toothbrush and toothpaste and essentials for brushing her teeth. Pt advised that we would get her washed up today and change sheets.

## 2018-10-01 NOTE — ED Notes (Signed)
Called Dietary and spoke with Jasmine and requested soup, sherbet ice cream and milk for pt's dinner per pt's request. She stated she will send up

## 2018-10-01 NOTE — ED Notes (Addendum)
Spoke with pt's son and updated him on pts status as of this morning. Pt's son Jill Castro would like someone from Social Work to call him back. Will attempt to contact and relay message.  Branch

## 2018-10-01 NOTE — ED Provider Notes (Signed)
-----------------------------------------   8:18 AM on 10/01/2018 -----------------------------------------   Blood pressure (!) 163/93, pulse 70, temperature 97.8 F (36.6 C), resp. rate 15, height 5\' 4"  (1.626 m), weight 58.8 kg, SpO2 93 %.  The patient had no acute events since last update.  Continue to await placement into skilled nursing/short-term rehab.  Clinical social worker is currently working with the patient and son for placement.  Awaiting insurance authorization at this time.     Harvest Dark, MD 10/01/18 [phone]

## 2018-10-01 NOTE — ED Notes (Signed)
Social worker called and advised pt's son was needing to speak with him.

## 2018-10-01 NOTE — ED Notes (Signed)
Pt given ice cream, milk and water at this time.

## 2018-10-02 NOTE — ED Notes (Signed)
Pt informed had a bed at [facility] at this time. Pt states understanding, MD notified. Per Randall Hiss from [facility].

## 2018-10-02 NOTE — Clinical Social Work Note (Addendum)
CSW received phone call from [facility] that they have received approval for patient to go to SNF for short term rehab.  CSW contacted patient's son Aisley Whan [phone] and updated him that patient will be discharging today.  CSW contacted bedside nurse to inform her that patient has been approved and asked her to inform patient as well.  Patient to be d/c'ed today to [facility] room 71A.  Patient and family agreeable to plans will transport via ems RN to call report to [phone].  Patient's son is aware that patient is discharging today, CSW updated Rollene Fare at [facility] that patient will be discharging to SNF.  Evette Cristal, MSW, [facility] ED Covering CSW [phone] 10/02/2018 9:49 AM

## 2018-10-02 NOTE — ED Notes (Addendum)
Pt able to take PO medications, 1 at a time. Pt readjusted in bed prior to medication administration. Pt given breakfast meal tray at this time. Pt tolerated well. Pt requesting coffee at this time.

## 2018-10-02 NOTE — ED Notes (Signed)
Pt bed sheet changed and external catheter replaced after sheets were found to be damp. Small amount of stool noted. Barrier cream applied. Pt tolerated well. Provided for comfort and safety and will continue to assess.

## 2018-10-02 NOTE — ED Notes (Signed)
PT at bedside at this time 

## 2018-10-02 NOTE — Progress Notes (Signed)
Physical Therapy Treatment Patient Details Name: Jill Castro MRN: [id number] DOB: 06-19-1927 Today's Date: 10/02/2018    History of Present Illness Patient is an 84 year old female admitted s/p fall for a pelvic pain and LE swelling. Imaging revealed "recent fractures of the right sacral ala, right pubic symphysis".  PMH includes HOH, macular degeneration, osteoporosis and scoliosis.    PT Comments    Pt in bed, requesting to use bedpan.  Unable to walk to commode.  Mod a for rolling left/right for placement.  Medium soft BM with care as appropriate.  She agreed to try to walk this am.  To edge of bed with inc time and min assist.  She was able to stand with min a x 1 but after multiple attempts she was not able to take any steps forward.  She was able with encouragement to "shimmy" feet sideways along bed.  She was motivated to attempt but pain remains a limiting factor.  Participated in exercises as described below.  Returned to supine and positioned to comfort.  SNF is appropriate for discharge to return pt to PLOF.   Follow Up Recommendations  SNF     Equipment Recommendations       Recommendations for Other Services       Precautions / Restrictions Precautions Precautions: Fall Restrictions Weight Bearing Restrictions: Yes RLE Weight Bearing: Weight bearing as tolerated LLE Weight Bearing: Weight bearing as tolerated    Mobility  Bed Mobility Overal bed mobility: Needs Assistance Bed Mobility: Rolling;Supine to Sit;Sit to Supine Rolling: Mod assist   Supine to sit: Min assist Sit to supine: Min assist   General bed mobility comments: Assist for LEs; manages upper body well  Transfers Overall transfer level: Needs assistance Equipment used: Rolling walker (2 wheeled) Transfers: Sit to/from Stand Sit to Stand: Min assist            Ambulation/Gait Ambulation/Gait assistance: Min Web designer (Feet): 2 Feet Assistive device: Rolling walker (2  wheeled) Gait Pattern/deviations: Step-to pattern Gait velocity: decreased   General Gait Details: pt unable to take steps forward today but was able to take several sidesteps along bed with encouragement.  Limited by pain but motivated to try.   Stairs             Wheelchair Mobility    Modified Rankin (Stroke Patients Only)       Balance Overall balance assessment: Needs assistance Sitting-balance support: Bilateral upper extremity supported;Feet supported Sitting balance-Leahy Scale: Good     Standing balance support: Bilateral upper extremity supported Standing balance-Leahy Scale: Poor Standing balance comment: heavy reliance on walker due to pain.                            Cognition Arousal/Alertness: Awake/alert Behavior During Therapy: WFL for tasks assessed/performed Overall Cognitive Status: Within Functional Limits for tasks assessed                                        Exercises Other Exercises Other Exercises: bed pan for medium soft BM.  Assist needed for rolling but she was able to do a small bridge to reposition. Other Exercises: seated ankle pumps, LAQ, ab/add and small marches 2 x 10    General Comments        Pertinent Vitals/Pain Pain Assessment: Faces Faces Pain Scale: Hurts  even more Pain Location: R groin with movement Pain Descriptors / Indicators: Aching;Discomfort Pain Intervention(s): Limited activity within patient's tolerance;Monitored during session    Home Living                      Prior Function            PT Goals (current goals can now be found in the care plan section) Progress towards PT goals: Progressing toward goals    Frequency    7X/week      PT Plan Discharge plan needs to be updated    Co-evaluation              AM-PAC PT "6 Clicks" Daily Activity  Outcome Measure  Difficulty turning over in bed (including adjusting bedclothes, sheets and  blankets)?: Unable Difficulty moving from lying on back to sitting on the side of the bed? : Unable Difficulty sitting down on and standing up from a chair with arms (e.g., wheelchair, bedside commode, etc,.)?: Unable Help needed moving to and from a bed to chair (including a wheelchair)?: A Lot Help needed walking in hospital room?: A Lot Help needed climbing 3-5 steps with a railing? : Total 6 Click Score: 8    End of Session Equipment Utilized During Treatment: Gait belt Activity Tolerance: Patient limited by pain Patient left: in bed;with call bell/phone within reach;with bed alarm set         Time: 1610-9604 PT Time Calculation (min) (ACUTE ONLY): 32 min  Charges:                        Chesley Noon, PTA 10/02/18, 10:49 AM

## 2018-10-02 NOTE — ED Provider Notes (Signed)
-----------------------------------------   6:33 AM on 10/02/2018 -----------------------------------------   Blood pressure (!) 145/73, pulse 64, temperature 98.4 F (36.9 C), temperature source Oral, resp. rate 16, height 1.626 m (5\' 4" ), weight 58.8 kg, SpO2 96 %.  The patient had no acute events since last update.  Calm and cooperative at this time.  Pending placement.   Hinda Kehr, MD 10/02/18 [phone]

## 2018-10-02 NOTE — ED Notes (Signed)
Waiting on [facility] to arrive for transport

## 2018-10-02 NOTE — ED Notes (Signed)
Pt given coffee at this time 

## 2018-10-02 NOTE — ED Notes (Signed)
NAD noted at time of D/C. Pt transferred to [facility] room 71 by [facility]. VSS at time of D/C.

## 2018-10-02 NOTE — ED Notes (Signed)
PT reports patient had BM at this time.

## 2018-10-02 NOTE — ED Notes (Signed)
Attempted to return call of patient's son, Jisella Ashenfelter [phone] without success.

## 2018-10-02 NOTE — ED Notes (Signed)
This RN to bedside, introduced self to patient. Pt requesting a phone with a speaker to call Mardene Celeste who works at [facility] to get clothes out of her room. This RN explained will give patient a phone after breakfast due to staff not likely being in the office at this time. Pt states understanding. Resting in bed, NAD noted at this time.

## 2018-10-10 ENCOUNTER — Ambulatory Visit: Payer: Self-pay | Admitting: *Deleted

## 2018-10-10 ENCOUNTER — Telehealth: Payer: Self-pay | Admitting: Cardiovascular Disease

## 2018-10-10 NOTE — Telephone Encounter (Signed)
Pt's son, Carlis Abbott calling PCPs office stating he was told by the occupational therapist at [facility] that the pt had a BP of 84/50 and then 110/70 when it was retaken. Pt's son also states that the pt requested him call Dr. Nicki Reaper to make her aware of BP.The pt's son also voices concern that the pt may be dehydrated. Pt's son states he did not speak with a nurse or anyone at the facility prior to calling the office.The son also states he was not at facility with the pt and is calling the practice at his mother's request.   Spoke with Angela,LPN at [facility] who does report that the pt initially had a BP of 84/50 after physical therapy today. Nurse states that she had the pt to lie down in bed and when she rechecked her BP it was 110/70. Nurse states that the pt is currently resting in bed with no complaints or symptoms currently. Nurse reports that the pt's BP is usually in the 140/60s-70s. Nurse states that the pt did voice concern over lower BP and nurse told the pt that she was going to hold her dose of Hydralazine for tonight due to the lower BP. Nurse did not voice any additional concerns at this time.   Reason for Disposition . [1] Follow-up call from patient regarding patient's clinical status AND [2] information urgent  Protocols used: PCP CALL - NO TRIAGE-A-AH

## 2018-10-10 NOTE — Telephone Encounter (Signed)
Please advise 

## 2018-10-10 NOTE — Telephone Encounter (Signed)
Pt c/o BP issue: STAT if pt c/o blurred vision, one-sided weakness or slurred speech  1. What are your last 5 BP readings?  84/50   110/60   2. Are you having any other symptoms (ex. Dizziness, headache, blurred vision, passed out)?  Weak generalized fatigue   3. What is your BP issue?  Patient son concerned bp is low and since she fell and had hip fx has been at [facility] for rehab and he doesn't know if they will address

## 2018-10-11 ENCOUNTER — Telehealth: Payer: Self-pay

## 2018-10-11 NOTE — Telephone Encounter (Signed)
Please call and confirm pt doing ok.  She is currently at [facility] per note.  Please confirm.  If this is correct, confirm this is skilled nursing.  There is a physician that follows her there and they need to make him or her aware of blood pressure and concerns about hydration, etc.

## 2018-10-11 NOTE — Telephone Encounter (Signed)
Left message for patients son to call back.

## 2018-10-11 NOTE — Telephone Encounter (Signed)
Spoke with son to let him know that we did receive message. Per son, he talked with [facility] and pt is being monitored due to BP issues but has not had anymore issues with BP dropping since she had therapy yesterday.

## 2018-10-11 NOTE — Telephone Encounter (Signed)
Copied from [place] [id number]. Topic: General - Other >> Oct 11, 2018 10:26 AM Yvette Rack wrote: Reason for CRM: Pt friend Zachery Dakins called in while with the pt asking if Dr. Nicki Reaper was aware of an appt with [facility] on 10/16/18 at 10:30 am. Jeneen Rinks stated pt is at [facility] at the current moment due to a cracked pelvis and just wanted to know about the appt with [facility]. Cb# [phone]

## 2018-10-11 NOTE — Telephone Encounter (Signed)
I have not referred pt recently.  Need more info.  Who is the appt with?  Is this an appt with Jefm Bryant (ortho? For f/u pelvic fractures - made in hospital or was this an old appt.

## 2018-10-11 NOTE — Telephone Encounter (Signed)
Spoke with patients son per release form and Dois Davenport with [employer] care. BP at 168/85 last night and therapist then checked blood pressure was 85/50 then 110/60. She is there due to pelvic fracture. She is there for rehab from a fall and fracture. He noticed she has had some orthostatic hypotension and was concerned about dehydration while she is there.   Called facility and spoke with patients nurse [place] and he states that they do have a physician and resident who manage patients while they are there. Advised that I would make family aware.  Spoke with son and reviewed information with him regarding physicians there at the facility and to please let us know if he should have any further question or concerns. He was very appreciative with no further questions.

## 2018-10-12 NOTE — Telephone Encounter (Signed)
It looks like Geophysicist/field seismologist made the appointment for her for a hospital follow up.

## 2018-10-12 NOTE — Telephone Encounter (Signed)
Who is the appt with?  Thanks

## 2018-10-15 NOTE — Telephone Encounter (Signed)
Jill Castro

## 2018-10-15 NOTE — Telephone Encounter (Signed)
Spoke with patient. She is going to the appt tomorrow.

## 2018-10-15 NOTE — Telephone Encounter (Signed)
Would recommend keeping appt.  This is for f/u with ortho (f/u regarding her pelvic fractures)

## 2018-10-22 DIAGNOSIS — Z7901 Long term (current) use of anticoagulants: Secondary | ICD-10-CM

## 2018-10-22 DIAGNOSIS — I872 Venous insufficiency (chronic) (peripheral): Secondary | ICD-10-CM

## 2018-10-22 DIAGNOSIS — I130 Hypertensive heart and chronic kidney disease with heart failure and stage 1 through stage 4 chronic kidney disease, or unspecified chronic kidney disease: Secondary | ICD-10-CM

## 2018-10-22 DIAGNOSIS — M6281 Muscle weakness (generalized): Secondary | ICD-10-CM

## 2018-10-22 DIAGNOSIS — M545 Low back pain: Secondary | ICD-10-CM

## 2018-10-22 DIAGNOSIS — R2689 Other abnormalities of gait and mobility: Secondary | ICD-10-CM

## 2018-10-22 DIAGNOSIS — I4891 Unspecified atrial fibrillation: Secondary | ICD-10-CM

## 2018-10-22 DIAGNOSIS — N183 Chronic kidney disease, stage 3 (moderate): Secondary | ICD-10-CM | POA: Diagnosis not present

## 2018-10-22 DIAGNOSIS — R441 Visual hallucinations: Secondary | ICD-10-CM

## 2018-10-22 DIAGNOSIS — I5032 Chronic diastolic (congestive) heart failure: Secondary | ICD-10-CM | POA: Diagnosis not present

## 2018-10-29 ENCOUNTER — Ambulatory Visit: Payer: Medicare Other | Admitting: Cardiovascular Disease

## 2018-11-19 ENCOUNTER — Ambulatory Visit (HOSPITAL_COMMUNITY)
Admission: AD | Admit: 2018-11-19 | Discharge: 2018-11-19 | Disposition: A | Discharge status: Home / Self Care | Payer: Medicare Other | Source: Other Acute Inpatient Hospital | Attending: Emergency Medicine | Admitting: Emergency Medicine

## 2018-11-19 ENCOUNTER — Other Ambulatory Visit: Payer: Self-pay

## 2018-11-19 ENCOUNTER — Encounter: Payer: Self-pay | Admitting: *Deleted

## 2018-11-19 ENCOUNTER — Emergency Department: Payer: Medicare Other

## 2018-11-19 ENCOUNTER — Emergency Department
Admission: EM | Admit: 2018-11-19 | Discharge: 2018-11-19 | Disposition: A | Discharge status: Other Acute Inpatient Hospital | Payer: Medicare Other | Attending: Emergency Medicine | Admitting: Emergency Medicine

## 2018-11-19 DIAGNOSIS — K5700 Diverticulitis of small intestine with perforation and abscess without bleeding: Secondary | ICD-10-CM | POA: Diagnosis not present

## 2018-11-19 DIAGNOSIS — Z8542 Personal history of malignant neoplasm of other parts of uterus: Secondary | ICD-10-CM | POA: Insufficient documentation

## 2018-11-19 DIAGNOSIS — R112 Nausea with vomiting, unspecified: Secondary | ICD-10-CM | POA: Diagnosis not present

## 2018-11-19 DIAGNOSIS — F329 Major depressive disorder, single episode, unspecified: Secondary | ICD-10-CM | POA: Insufficient documentation

## 2018-11-19 DIAGNOSIS — N183 Chronic kidney disease, stage 3 (moderate): Secondary | ICD-10-CM | POA: Insufficient documentation

## 2018-11-19 DIAGNOSIS — Z7901 Long term (current) use of anticoagulants: Secondary | ICD-10-CM | POA: Insufficient documentation

## 2018-11-19 DIAGNOSIS — I5032 Chronic diastolic (congestive) heart failure: Secondary | ICD-10-CM | POA: Diagnosis not present

## 2018-11-19 DIAGNOSIS — K631 Perforation of intestine (nontraumatic): Secondary | ICD-10-CM

## 2018-11-19 DIAGNOSIS — R109 Unspecified abdominal pain: Secondary | ICD-10-CM | POA: Insufficient documentation

## 2018-11-19 DIAGNOSIS — Z79899 Other long term (current) drug therapy: Secondary | ICD-10-CM | POA: Insufficient documentation

## 2018-11-19 DIAGNOSIS — I130 Hypertensive heart and chronic kidney disease with heart failure and stage 1 through stage 4 chronic kidney disease, or unspecified chronic kidney disease: Secondary | ICD-10-CM | POA: Insufficient documentation

## 2018-11-19 DIAGNOSIS — R1030 Lower abdominal pain, unspecified: Secondary | ICD-10-CM | POA: Diagnosis present

## 2018-11-19 DIAGNOSIS — K5710 Diverticulosis of small intestine without perforation or abscess without bleeding: Secondary | ICD-10-CM

## 2018-11-19 LAB — ABO/RH: ABO/RH(D): O POS

## 2018-11-19 LAB — CBC
HEMATOCRIT: 20.6 % — AB (ref 36.0–46.0)
HEMOGLOBIN: 6.3 g/dL — AB (ref 12.0–15.0)
MCH: 30.9 pg (ref 26.0–34.0)
MCHC: 30.6 g/dL (ref 30.0–36.0)
MCV: 101 fL — ABNORMAL HIGH (ref 80.0–100.0)
NRBC: 0 % (ref 0.0–0.2)
Platelets: 127 10*3/uL — ABNORMAL LOW (ref 150–400)
RBC: 2.04 MIL/uL — AB (ref 3.87–5.11)
RDW: 16.8 % — ABNORMAL HIGH (ref 11.5–15.5)
WBC: 7.2 10*3/uL (ref 4.0–10.5)

## 2018-11-19 LAB — URINALYSIS, COMPLETE (UACMP) WITH MICROSCOPIC
BILIRUBIN URINE: NEGATIVE
Bacteria, UA: NONE SEEN
Glucose, UA: NEGATIVE mg/dL
Hgb urine dipstick: NEGATIVE
KETONES UR: NEGATIVE mg/dL
Leukocytes, UA: NEGATIVE
NITRITE: NEGATIVE
Protein, ur: NEGATIVE mg/dL
Specific Gravity, Urine: 1.011 (ref 1.005–1.030)
Squamous Epithelial / HPF: NONE SEEN (ref 0–5)
pH: 6 (ref 5.0–8.0)

## 2018-11-19 LAB — HEPATIC FUNCTION PANEL
ALT: 16 U/L (ref 0–44)
AST: 23 U/L (ref 15–41)
Albumin: 3.9 g/dL (ref 3.5–5.0)
Alkaline Phosphatase: 139 U/L — ABNORMAL HIGH (ref 38–126)
Bilirubin, Direct: <0.1 mg/dL (ref 0.0–0.2)
Total Bilirubin: 0.7 mg/dL (ref 0.3–1.2)
Total Protein: 7.2 g/dL (ref 6.5–8.1)

## 2018-11-19 LAB — BASIC METABOLIC PANEL
ANION GAP: 8 (ref 5–15)
BUN: 44 mg/dL — ABNORMAL HIGH (ref 8–23)
CHLORIDE: 102 mmol/L (ref 98–111)
CO2: 25 mmol/L (ref 22–32)
Calcium: 9.2 mg/dL (ref 8.9–10.3)
Creatinine, Ser: 1.89 mg/dL — ABNORMAL HIGH (ref 0.44–1.00)
GFR, EST AFRICAN AMERICAN: 27 mL/min — AB (ref >60)
GFR, EST NON AFRICAN AMERICAN: 23 mL/min — AB (ref >60)
Glucose, Bld: 129 mg/dL — ABNORMAL HIGH (ref 70–99)
POTASSIUM: 4.1 mmol/L (ref 3.5–5.1)
SODIUM: 135 mmol/L (ref 135–145)

## 2018-11-19 LAB — TROPONIN I

## 2018-11-19 LAB — PREPARE RBC (CROSSMATCH)

## 2018-11-19 LAB — LIPASE, BLOOD: Lipase: 64 U/L — ABNORMAL HIGH (ref 11–51)

## 2018-11-19 MED ORDER — ONDANSETRON HCL 4 MG/2ML IJ SOLN
4.0000 mg | Freq: Once | INTRAMUSCULAR | Status: AC
  Administered 2018-11-19: 4 mg via INTRAVENOUS
  Filled 2018-11-19: qty 2

## 2018-11-19 MED ORDER — METRONIDAZOLE IN NACL 5-0.79 MG/ML-% IV SOLN
500.0000 mg | Freq: Three times a day (TID) | INTRAVENOUS | Status: DC
  Administered 2018-11-19: 500 mg via INTRAVENOUS
  Filled 2018-11-19: qty 100

## 2018-11-19 MED ORDER — SODIUM CHLORIDE 0.9 % IV SOLN: 2.0000 g | INTRAVENOUS | Status: DC

## 2018-11-19 MED ORDER — SODIUM CHLORIDE 0.9 % IV SOLN
2.0000 g | Freq: Once | INTRAVENOUS | Status: AC
  Administered 2018-11-19: 2 g via INTRAVENOUS
  Filled 2018-11-19: qty 2

## 2018-11-19 MED ORDER — IOPAMIDOL (ISOVUE-300) INJECTION 61%: 30.0000 mL | Freq: Once | INTRAVENOUS | Status: DC | PRN

## 2018-11-19 MED ORDER — SODIUM CHLORIDE 0.9 % IV SOLN: 10.0000 mL/h | Freq: Once | INTRAVENOUS | Status: DC

## 2018-11-19 MED ORDER — IOHEXOL 300 MG/ML  SOLN: 30.0000 mL | Freq: Once | INTRAMUSCULAR | Status: DC | PRN

## 2018-11-19 MED ORDER — MORPHINE SULFATE (PF) 4 MG/ML IV SOLN
4.0000 mg | Freq: Once | INTRAVENOUS | Status: AC
  Administered 2018-11-19: 4 mg via INTRAVENOUS
  Filled 2018-11-19: qty 1

## 2018-11-19 MED ORDER — IOPAMIDOL (ISOVUE-300) INJECTION 61%
30.0000 mL | Freq: Once | INTRAVENOUS | Status: AC | PRN
  Administered 2018-11-19: 30 mL via ORAL

## 2018-11-19 MED ORDER — SODIUM CHLORIDE 0.9 % IV BOLUS
500.0000 mL | Freq: Once | INTRAVENOUS | Status: AC
  Administered 2018-11-19: 500 mL via INTRAVENOUS

## 2018-11-19 NOTE — ED Notes (Signed)
ED Provider at bedside. 

## 2018-11-19 NOTE — ED Notes (Signed)
Called CT to have images powershared and CD  [phone]

## 2018-11-19 NOTE — ED Notes (Signed)
Called [facility] for transfer [phone] spoke to [place]

## 2018-11-19 NOTE — ED Notes (Signed)
Consent for transfer to [facility] signed by son.

## 2018-11-19 NOTE — ED Notes (Signed)
Family with pt

## 2018-11-19 NOTE — ED Provider Notes (Signed)
[facility] Emergency Department Provider Note ____________________________________________   First MD Initiated Contact with Patient 11/19/18 1842     (approximate)  I have reviewed the triage vital signs and the nursing notes.   HISTORY  Chief Complaint Abdominal Pain    HPI Jill Castro is a 84 y.o. female with PMH as noted below who presents with lower abdominal pain, acute onset several hours ago, associated with an episode of vomiting, and not associated with fever.  The patient states that she has been constipated recently.  She states that her abdomen might be somewhat more distended than usual.  She denies any urinary symptoms.  No prior history of this pain.  The patient denies any prior abdominal surgeries.  Past Medical History:  Diagnosis Date  . Sherran Needs syndrome   . Diastolic CHF, acute (HCC)    Echo (8/10) with EF 60-65%, mild LVH, mild to moderate TR, PASP 40 mmHg.  . Dizziness    Thought to be side effect of Norvasc  . GERD (gastroesophageal reflux disease)   . Glaucoma   . HTN (hypertension)   . Hypercalcemia    On HCTZ  . Hypercholesterolemia   . Hyperkalemia   . Macular degeneration   . Osteoporosis   . Renal fibromuscular dysplasia ([place])   . Renal insufficiency   . S/P cardiac catheterization 05/2002   Luminal irregularities only in the coronaries. EF 55%. There was some irregulatrity in the right renal artery suggestive of possible fibromuscular dysplasia. Myoview in 5/08 showed no ischemia or infarction. Lexiscan myoview at [facility] (10/10) showed EF 55%, normal wall motion, no evience ofor ischemia or infarction.  . Scoliosis associated with other condition    Chronic back pain  . Uterine cancer ([place])    S/P hypsterectomy in 2008  . Wears hearing aid    bilateral    Patient Active Problem List   Diagnosis Date Noted  . A-fib ([place]) 08/23/2018  . Atrial flutter ([place]) 08/03/2018  . Cervical lymphadenopathy  12/08/2017  . Loose stools 08/20/2017  . Depression 01/29/2017  . Visual hallucinations 07/21/2016  . Loss of weight 05/12/2016  . Facial lesion 08/30/2015  . Health care maintenance 04/22/2015  . DOE (dyspnea on exertion) 02/03/2015  . Drainage from nose 10/27/2014  . GERD (gastroesophageal reflux disease) 03/15/2013  . Right shoulder pain 11/30/2012  . Chronic kidney disease (CKD), stage III (moderate) ([place]) 10/18/2012  . Hyperkalemia 10/18/2012  . Osteoporosis 10/18/2012  . History of endometrial cancer 10/18/2012  . Chronic back pain 10/18/2012  . Hypertension 10/16/2012  . EDEMA 10/30/2009  . Hypercholesterolemia 09/22/2009  . Chest pain 09/08/2009  . Chronic diastolic CHF (congestive heart failure) ([place]) 07/07/2009  . Shortness of breath 07/07/2009    Past Surgical History:  Procedure Laterality Date  . AQUEOUS SHUNT Left 01/30/2017   Procedure: AQUEOUS SHUNT ahmed tube;  Surgeon: Ronnell Freshwater, MD;  Location: [place];  Service: Ophthalmology;  Laterality: Left;  ahmed tube shunt with scleral patch graft  . CARDIAC CATHETERIZATION     Left  . CARDIOVERSION N/A 08/06/2018   Procedure: CARDIOVERSION;  Surgeon: Wellington Hampshire, MD;  Location: [facility];  Service: Cardiovascular;  Laterality: N/A;  . CARDIOVERSION N/A 08/24/2018   Procedure: CARDIOVERSION;  Surgeon: Minna Merritts, MD;  Location: [facility];  Service: Cardiovascular;  Laterality: N/A;  . CATARACT EXTRACTION Right 07/03/2013   [facility] Dr Wallace Going  . CATARACT EXTRACTION W/PHACO Left 01/30/2017   Procedure: CATARACT EXTRACTION  PHACO AND INTRAOCULAR LENS PLACEMENT (IOC)  left;  Surgeon: Ronnell Freshwater, MD;  Location: [place];  Service: Ophthalmology;  Laterality: Left;  . REFRACTIVE SURGERY    . TEE WITHOUT CARDIOVERSION N/A 08/06/2018   Procedure: TRANSESOPHAGEAL ECHOCARDIOGRAM (TEE);  Surgeon: Wellington Hampshire, MD;  Location: [facility];  Service: Cardiovascular;   Laterality: N/A;  . TOTAL ABDOMINAL HYSTERECTOMY W/ BILATERAL SALPINGOOPHORECTOMY     stage I C well differentiated adenocarcinoma    Prior to Admission medications   Medication Sig Start Date End Date Taking? Authorizing Provider  acetaminophen (TYLENOL) 325 MG tablet Take 325 mg by mouth daily as needed for mild pain.   Yes [provider]  acetaminophen (TYLENOL) 500 MG tablet Take 500 mg by mouth daily as needed for moderate pain.   Yes [provider]  amiodarone (PACERONE) 200 MG tablet Take 1 tablet (200 mg total) by mouth daily. 08/29/18  Yes Gollan, Kathlene November, MD  B Complex Vitamins (PA B-COMPLEX WITH B-12) TABS Take 1 tablet by mouth daily.     Yes [provider]  Calcium Carbonate-Vitamin D (CALCIUM 600+D) 600-200 MG-UNIT TABS Take 1 tablet by mouth daily.   Yes [provider]  docusate sodium (STOOL SOFTENER) 100 MG capsule Take 100 mg by mouth daily as needed for mild constipation.   Yes [provider]  ELIQUIS 2.5 MG TABS tablet TAKE ONE TABLET BY MOUTH TWICE DAILY Patient taking differently: Take 2.5 mg by mouth 2 (two) times daily.  09/07/18  Yes Gollan, Kathlene November, MD  furosemide (LASIX) 20 MG tablet Take 1 tablet (20 mg total) by mouth daily. 09/20/18 12/19/18 Yes Gollan, Kathlene November, MD  Glucosamine-Chondroitin 1500-1200 MG/30ML LIQD Take 15 mLs by mouth daily.    Yes [provider]  hydrALAZINE (APRESOLINE) 100 MG tablet Take 1 tablet (100 mg total) by mouth 3 (three) times daily. 09/21/18 12/20/18 Yes Gollan, Kathlene November, MD  losartan (COZAAR) 100 MG tablet Take 1 tablet (100 mg total) by mouth daily. 08/29/18  Yes Minna Merritts, MD  Magnesium 250 MG TABS Take 250 mg by mouth daily.   Yes [provider]  metoprolol succinate (TOPROL XL) 25 MG 24 hr tablet Take 1 tablet (25 mg total) by mouth daily. 09/05/18  Yes Minna Merritts, MD  Multiple Vitamin (MULTIVITAMIN) tablet Take 1 tablet by mouth daily.   Yes  [provider]  nystatin (NYSTATIN) powder Apply topically every 12 (twelve) hours as needed (for skin integrity/rash).    Yes [provider]  polyethylene glycol (MIRALAX / GLYCOLAX) packet Take 17 g by mouth daily.   Yes [provider]  ranitidine (ZANTAC) 150 MG tablet Take 150 mg by mouth at bedtime.    Yes [provider]  simvastatin (ZOCOR) 10 MG tablet TAKE ONE TABLET BY MOUTH AT BEDTIME Patient taking differently: Take 10 mg by mouth at bedtime.  09/06/18  Yes Minna Merritts, MD    Allergies Clonidine; Benicar [olmesartan]; Celebrex [celecoxib]; Iodine; and Penicillins  Family History  Problem Relation Age of Onset  . Heart disease Father   . Heart disease Mother   . Parkinson's disease Mother   . Diabetes Cousin     Social History Social History   Tobacco Use  . Smoking status: Never Smoker  . Smokeless tobacco: Never Used  Substance Use Topics  . Alcohol use: No    Alcohol/week: 0.0 standard drinks  . Drug use: No    Review of  Systems  Constitutional: No fever. Eyes: No redness. ENT: No sore throat. Cardiovascular: Denies chest pain. Respiratory: Denies shortness of breath. Gastrointestinal: Positive for vomiting. Genitourinary: Negative for dysuria.  Musculoskeletal: Negative for back pain. Skin: Negative for rash. Neurological: Negative for headache.   ____________________________________________   PHYSICAL EXAM:  VITAL SIGNS: ED Triage Vitals  Enc Vitals Group     BP 11/19/18 1836 (!) 174/106     Pulse Rate 11/19/18 1836 77     Resp 11/19/18 1836 20     Temp 11/19/18 1836 98.3 F (36.8 C)     Temp Source 11/19/18 1836 Oral     SpO2 11/19/18 1836 95 %     Weight 11/19/18 1833 150 lb (68 kg)     Height 11/19/18 1833 5\' 4"  (1.626 m)     Head Circumference --      Peak Flow --      Pain Score 11/19/18 1832 5     Pain Loc --      Pain Edu? --      Excl. in [place]? --     Constitutional: Alert and  oriented.  Uncomfortable appearing but in no acute distress. Eyes: Conjunctivae are normal.  No scleral icterus. Head: Atraumatic. Nose: No congestion/rhinnorhea. Mouth/Throat: Mucous membranes are somewhat dry.   Neck: Normal range of motion.  Cardiovascular:   Good peripheral circulation. Respiratory: Normal respiratory effort.  No retractions.  Gastrointestinal: Lower abdomen distended.  Soft with moderate right lower quadrant and mild left lower quadrant tenderness. Genitourinary: No flank tenderness. Musculoskeletal:   Extremities warm and well perfused.  Neurologic:  Normal speech and language. No gross focal neurologic deficits are appreciated.  Skin:  Skin is warm and dry. No rash noted. Psychiatric: Mood and affect are normal. Speech and behavior are normal.  ____________________________________________   LABS (all labs ordered are listed, but only abnormal results are displayed)  Labs Reviewed  BASIC METABOLIC PANEL - Abnormal; Notable for the following components:      Result Value   Glucose, Bld 129 (*)    BUN 44 (*)    Creatinine, Ser 1.89 (*)    GFR calc non Af Amer 23 (*)    GFR calc Af Amer 27 (*)    All other components within normal limits  CBC - Abnormal; Notable for the following components:   RBC 2.04 (*)    Hemoglobin 6.3 (*)    HCT 20.6 (*)    MCV 101.0 (*)    RDW 16.8 (*)    Platelets 127 (*)    All other components within normal limits  HEPATIC FUNCTION PANEL - Abnormal; Notable for the following components:   Alkaline Phosphatase 139 (*)    All other components within normal limits  LIPASE, BLOOD - Abnormal; Notable for the following components:   Lipase 64 (*)    All other components within normal limits  URINALYSIS, COMPLETE (UACMP) WITH MICROSCOPIC - Abnormal; Notable for the following components:   Color, Urine YELLOW (*)    APPearance CLEAR (*)    All other components within normal limits  TROPONIN I  PREPARE RBC (CROSSMATCH)  TYPE AND  SCREEN  ABO/RH   ____________________________________________  EKG  ED ECG REPORT I, Arta Silence, the attending physician, personally viewed and interpreted this ECG.  Date: 11/19/2018 EKG Time: 1839 Rate: 78 Rhythm: normal sinus rhythm QRS Axis: normal Intervals: Nonspecific IVCD ST/T Wave abnormalities: normal Narrative Interpretation: Nonspecific abnormalities with no evidence of acute ischemia  ____________________________________________  RADIOLOGY  CT abdomen: Fluid in the right upper quadrant, findings consistent with duodenal diverticulum with possible perforation  ____________________________________________   PROCEDURES  Procedure(s) performed: No  Procedures  Critical Care performed: Yes  CRITICAL CARE Performed by: Arta Silence   Total critical care time: 60 minutes  Critical care time was exclusive of separately billable procedures and treating other patients.  Critical care was necessary to treat or prevent imminent or life-threatening deterioration.  Critical care was time spent personally by me on the following activities: development of treatment plan with patient and/or surrogate as well as nursing, discussions with consultants, evaluation of patient's response to treatment, examination of patient, obtaining history from patient or surrogate, ordering and performing treatments and interventions, ordering and review of laboratory studies, ordering and review of radiographic studies, pulse oximetry and re-evaluation of patient's condition. ____________________________________________   INITIAL IMPRESSION / ASSESSMENT AND PLAN / ED COURSE  Pertinent labs & imaging results that were available during my care of the patient were reviewed by me and considered in my medical decision making (see chart for details).  84 year old female with PMH as noted above presents with acute onset of lower abdominal pain and vomiting.    On exam the  patient is uncomfortable but not acutely ill-appearing.  Her vital signs are normal.  The abdomen appears somewhat distended although the patient cannot tell me if this is any change from her baseline.  She does report significant constipation.  She is tender in the bilateral lower quadrants.  Differential includes constipation, bowel obstruction, volvulus, diverticulitis, colitis, or less likely urinary retention.  We will obtain lab work-up, UA, CT, and reassess.  ----------------------------------------- 10:27 PM on 11/19/2018 -----------------------------------------  The lab work-up is significant primarily for anemia, with a hemoglobin of 6.3 down from eleven 1 month ago.  The CT shows fluid in the right upper quadrant and findings suggesting a duodenal diverticulum with likely perforation.  I consulted Dr. San Jetty from general surgery.  We discussed the findings and the patient's clinical presentation.  He advises that surgical management of this could potentially become complicated requiring surgical subspecialist, and he recommended that we transfer the patient to a tertiary facility.  I discussed the results of the work-up extensively with the patient and her family members present.  Based on our discussion, the patient would prefer transfer to [facility].  I contacted the [facility] transfer center and discussed the case with Dr. Dorothyann Peng from general surgery at [facility].  He agreed to accept the patient in transfer.  He advised that she may not require surgery depending on if the fluid is primarily retroperitoneal however she would then likely require close observation.  The patient will be transferred ED to ED and evaluated by surgery there.  The patient agrees with this plan.  I ordered broad-spectrum antibiotics for intra-abdominal infection, and I ordered a type and screen and blood transfusion. At this time, [facility] is en route to transport the patient.  Blood is not yet ready, however given the  patient's stable vital signs I think she is stable for transfer at this time and can receive transfusion when she arrives at [facility].  ____________________________________________   FINAL CLINICAL IMPRESSION(S) / ED DIAGNOSES  Final diagnoses:  Duodenal diverticulum  Duodenal perforation ([place])      NEW MEDICATIONS STARTED DURING THIS VISIT:  Discharge Medication List as of 11/19/2018 11:28 PM       Note:  This document was prepared using Dragon voice recognition software and may include unintentional  dictation errors.    Arta Silence, MD 11/19/18 [phone]

## 2018-11-19 NOTE — ED Notes (Signed)
Up to bathroom with assistance.  meds given  Iv fluids infusing.  siderails up x 2

## 2018-11-19 NOTE — ED Notes (Signed)
Consent signed by pt for blood

## 2018-11-19 NOTE — ED Notes (Signed)
Called [facility] for transport as [facility] was unavailable 2130

## 2018-11-19 NOTE — ED Notes (Addendum)
[facility] here to transport pt to [facility]. Pt alert.  Family with pt.  prbc's started prior to transfer.  rn at bedside during 1st 15 minutes of blood infusion. Pt placed on 2 liters oxygen via [place].

## 2018-11-19 NOTE — ED Triage Notes (Signed)
Pt brought in via [facility] from [facility].  Pt has abd pain with n/v.   Iv in place  4mg  zofran given by ems.  Pt alert.

## 2018-11-19 NOTE — ED Notes (Signed)
md in with pt again.  meds infusing.

## 2018-11-19 NOTE — ED Notes (Signed)
Pt to [facility] via [facility]

## 2018-11-19 NOTE — ED Notes (Signed)
Bladder scan 169 cc urine

## 2018-11-19 NOTE — Progress Notes (Signed)
Pharmacy Antibiotic Note  Jill Castro is a 84 y.o. female admitted on 11/19/2018 with intra-abdominal infection.  Pharmacy has been consulted for cefepine dosing.  Plan: Cefepime 2 grams q 24 hours ordered  Height: 5\' 4"  (162.6 cm) Weight: 150 lb (68 kg) IBW/kg (Calculated) : 54.7  Temp (24hrs), Avg:98.3 F (36.8 C), Min:98.3 F (36.8 C), Max:98.3 F (36.8 C)  Recent Labs  Lab 11/19/18 1842  WBC 7.2  CREATININE 1.89*    Estimated Creatinine Clearance: 19.1 mL/min (A) (by C-G formula based on SCr of 1.89 mg/dL (H)).    Allergies  Allergen Reactions  . Clonidine   . Benicar [Olmesartan] Other (See Comments)    Lip swelling  . Celebrex [Celecoxib]   . Iodine     Avoids due to decreased kidney function  . Penicillins Rash    Has patient had a PCN reaction causing immediate rash, facial/tongue/throat swelling, SOB or lightheadedness with hypotension: Unknown Has patient had a PCN reaction causing severe rash involving mucus membranes or skin necrosis: Unknown Has patient had a PCN reaction that required hospitalization: Unknown Has patient had a PCN reaction occurring within the last 10 years: Unknown If all of the above answers are "NO", then may proceed with Cephalosporin use.     Antimicrobials this admission: Cefepime, metronidazole  >>    >>   Dose adjustments this admission:   Microbiology results: No micro   Thank you for allowing pharmacy to be a part of this patient's care.  Anthonny Schiller S 11/19/2018 11:04 PM

## 2018-11-19 NOTE — ED Notes (Signed)
Pt brought in via [facility] from [facility].  Pt has abd distention.  Vomited x 1.  zofran given by ems.  Pt denies chest pain or sob.  Recent pelvis fracture and pt did rehab.  Pt reports few bm's recently.

## 2018-11-19 NOTE — ED Notes (Signed)
[facility]TALA and Medical Necessity documentation reviewed at this time and noted to be compete per policy.

## 2018-11-20 LAB — TYPE AND SCREEN
ABO/RH(D): O POS
Antibody Screen: NEGATIVE
Unit division: 0

## 2018-11-20 LAB — BPAM RBC
Blood Product Expiration Date: 202001162359
ISSUE DATE / TIME: 201912232248
Unit Type and Rh: 5100

## 2018-11-22 MED ORDER — ACETAMINOPHEN 325 MG PO TABS
975.00 | ORAL_TABLET | ORAL | Status: DC
Start: 2018-11-23 — End: 2018-11-22

## 2018-11-22 MED ORDER — DORZOLAMIDE HCL-TIMOLOL MAL 22.3-6.8 MG/ML OP SOLN
1.00 | OPHTHALMIC | Status: DC
Start: 2018-11-24 — End: 2018-11-22

## 2018-11-22 MED ORDER — ONDANSETRON HCL 4 MG/2ML IJ SOLN
4.00 | INTRAMUSCULAR | Status: DC
Start: ? — End: 2018-11-22

## 2018-11-22 MED ORDER — GENERIC EXTERNAL MEDICATION
12.50 | Status: DC
Start: 2018-11-22 — End: 2018-11-22

## 2018-11-22 MED ORDER — HYDROMORPHONE HCL 1 MG/ML IJ SOLN
0.25 | INTRAMUSCULAR | Status: DC
Start: ? — End: 2018-11-22

## 2018-11-22 MED ORDER — METRONIDAZOLE IN NACL 5-0.79 MG/ML-% IV SOLN
500.00 | INTRAVENOUS | Status: DC
Start: 2018-11-22 — End: 2018-11-22

## 2018-11-22 MED ORDER — GENERIC EXTERNAL MEDICATION
1.00 | Status: DC
Start: 2018-11-22 — End: 2018-11-22

## 2018-11-22 MED ORDER — DEXTROSE IN LACTATED RINGERS 5 % IV SOLN
INTRAVENOUS | Status: DC
Start: ? — End: 2018-11-22

## 2018-11-22 MED ORDER — LIDOCAINE HCL (PF) 1 % IJ SOLN
0.50 | INTRAMUSCULAR | Status: DC
Start: ? — End: 2018-11-22

## 2018-11-22 MED ORDER — AMIODARONE HCL 200 MG PO TABS
200.00 | ORAL_TABLET | ORAL | Status: DC
Start: 2018-11-23 — End: 2018-11-22

## 2018-11-22 MED ORDER — HEPARIN SODIUM (PORCINE) 5000 UNIT/ML IJ SOLN
5000.00 | INTRAMUSCULAR | Status: DC
Start: 2018-11-22 — End: 2018-11-22

## 2018-11-24 MED ORDER — LOPERAMIDE HCL 2 MG PO CAPS
2.00 | ORAL_CAPSULE | ORAL | Status: DC
Start: ? — End: 2018-11-24

## 2018-11-24 MED ORDER — HYDROMORPHONE HCL 1 MG/ML IJ SOLN
0.25 | INTRAMUSCULAR | Status: DC
Start: ? — End: 2018-11-24

## 2018-11-24 MED ORDER — LORAZEPAM 2 MG/ML IJ SOLN
0.50 | INTRAMUSCULAR | Status: DC
Start: ? — End: 2018-11-24

## 2018-11-29 ENCOUNTER — Telehealth: Payer: Self-pay | Admitting: *Deleted

## 2018-11-29 NOTE — Telephone Encounter (Signed)
Patient was admitted into hospice home on 11/23/2018. There were no other pages to send over. It was just an [place] for you

## 2018-11-29 NOTE — Telephone Encounter (Signed)
Copied from [place] [id number]. Topic: General - Other >> Nov 27, 2018  1:11 PM Valla Leaver wrote: Reason for CRM: Debra, [name] wants to notify Dr. Nicki Reaper that they didn't have any additional pages to the forms they sent regarding the patients initial admission to hospice. Please call if any questions or concerns.

## 2018-12-10 DIAGNOSIS — S36430A Laceration of duodenum, initial encounter: Secondary | ICD-10-CM

## 2018-12-10 DIAGNOSIS — E440 Moderate protein-calorie malnutrition: Secondary | ICD-10-CM | POA: Diagnosis not present

## 2018-12-10 DIAGNOSIS — I773 Arterial fibromuscular dysplasia: Secondary | ICD-10-CM

## 2018-12-10 DIAGNOSIS — N183 Chronic kidney disease, stage 3 (moderate): Secondary | ICD-10-CM

## 2018-12-10 DIAGNOSIS — F39 Unspecified mood [affective] disorder: Secondary | ICD-10-CM | POA: Diagnosis not present

## 2018-12-10 DIAGNOSIS — I5032 Chronic diastolic (congestive) heart failure: Secondary | ICD-10-CM | POA: Diagnosis not present

## 2018-12-10 DIAGNOSIS — I4811 Longstanding persistent atrial fibrillation: Secondary | ICD-10-CM

## 2019-01-03 DIAGNOSIS — E440 Moderate protein-calorie malnutrition: Secondary | ICD-10-CM | POA: Diagnosis not present

## 2019-01-03 DIAGNOSIS — F39 Unspecified mood [affective] disorder: Secondary | ICD-10-CM

## 2019-01-03 DIAGNOSIS — I5032 Chronic diastolic (congestive) heart failure: Secondary | ICD-10-CM

## 2019-01-03 DIAGNOSIS — I480 Paroxysmal atrial fibrillation: Secondary | ICD-10-CM | POA: Diagnosis not present

## 2019-01-03 DIAGNOSIS — K261 Acute duodenal ulcer with perforation: Secondary | ICD-10-CM

## 2019-01-29 DIAGNOSIS — H10013 Acute follicular conjunctivitis, bilateral: Secondary | ICD-10-CM | POA: Diagnosis not present

## 2019-01-29 DIAGNOSIS — R609 Edema, unspecified: Secondary | ICD-10-CM

## 2019-02-08 DIAGNOSIS — K631 Perforation of intestine (nontraumatic): Secondary | ICD-10-CM

## 2019-02-08 DIAGNOSIS — I4891 Unspecified atrial fibrillation: Secondary | ICD-10-CM | POA: Diagnosis not present

## 2019-02-08 DIAGNOSIS — I5030 Unspecified diastolic (congestive) heart failure: Secondary | ICD-10-CM | POA: Diagnosis not present

## 2019-02-08 DIAGNOSIS — F39 Unspecified mood [affective] disorder: Secondary | ICD-10-CM | POA: Diagnosis not present

## 2019-02-25 DIAGNOSIS — J181 Lobar pneumonia, unspecified organism: Secondary | ICD-10-CM

## 2019-02-25 DIAGNOSIS — R0902 Hypoxemia: Secondary | ICD-10-CM | POA: Diagnosis not present

## 2019-03-04 DIAGNOSIS — E8779 Other fluid overload: Secondary | ICD-10-CM | POA: Diagnosis not present

## 2019-03-04 DIAGNOSIS — E222 Syndrome of inappropriate secretion of antidiuretic hormone: Secondary | ICD-10-CM | POA: Diagnosis not present

## 2019-03-05 DIAGNOSIS — S8011XA Contusion of right lower leg, initial encounter: Secondary | ICD-10-CM | POA: Diagnosis not present

## 2019-03-06 DIAGNOSIS — I5020 Unspecified systolic (congestive) heart failure: Secondary | ICD-10-CM

## 2019-03-06 DIAGNOSIS — I4891 Unspecified atrial fibrillation: Secondary | ICD-10-CM

## 2019-03-06 DIAGNOSIS — F39 Unspecified mood [affective] disorder: Secondary | ICD-10-CM

## 2019-03-06 DIAGNOSIS — K631 Perforation of intestine (nontraumatic): Secondary | ICD-10-CM | POA: Diagnosis not present

## 2019-03-12 DIAGNOSIS — N183 Chronic kidney disease, stage 3 (moderate): Secondary | ICD-10-CM | POA: Diagnosis not present

## 2019-03-12 DIAGNOSIS — E222 Syndrome of inappropriate secretion of antidiuretic hormone: Secondary | ICD-10-CM | POA: Diagnosis not present

## 2019-03-27 IMAGING — DX DG CHEST 1V PORT
1 series · 1 of 1 positions shown · non-contrast
Comparison: 08/12/2017

CLINICAL DATA: AFib with rapid response

EXAM:
PORTABLE CHEST 1 VIEW

[chest ap]
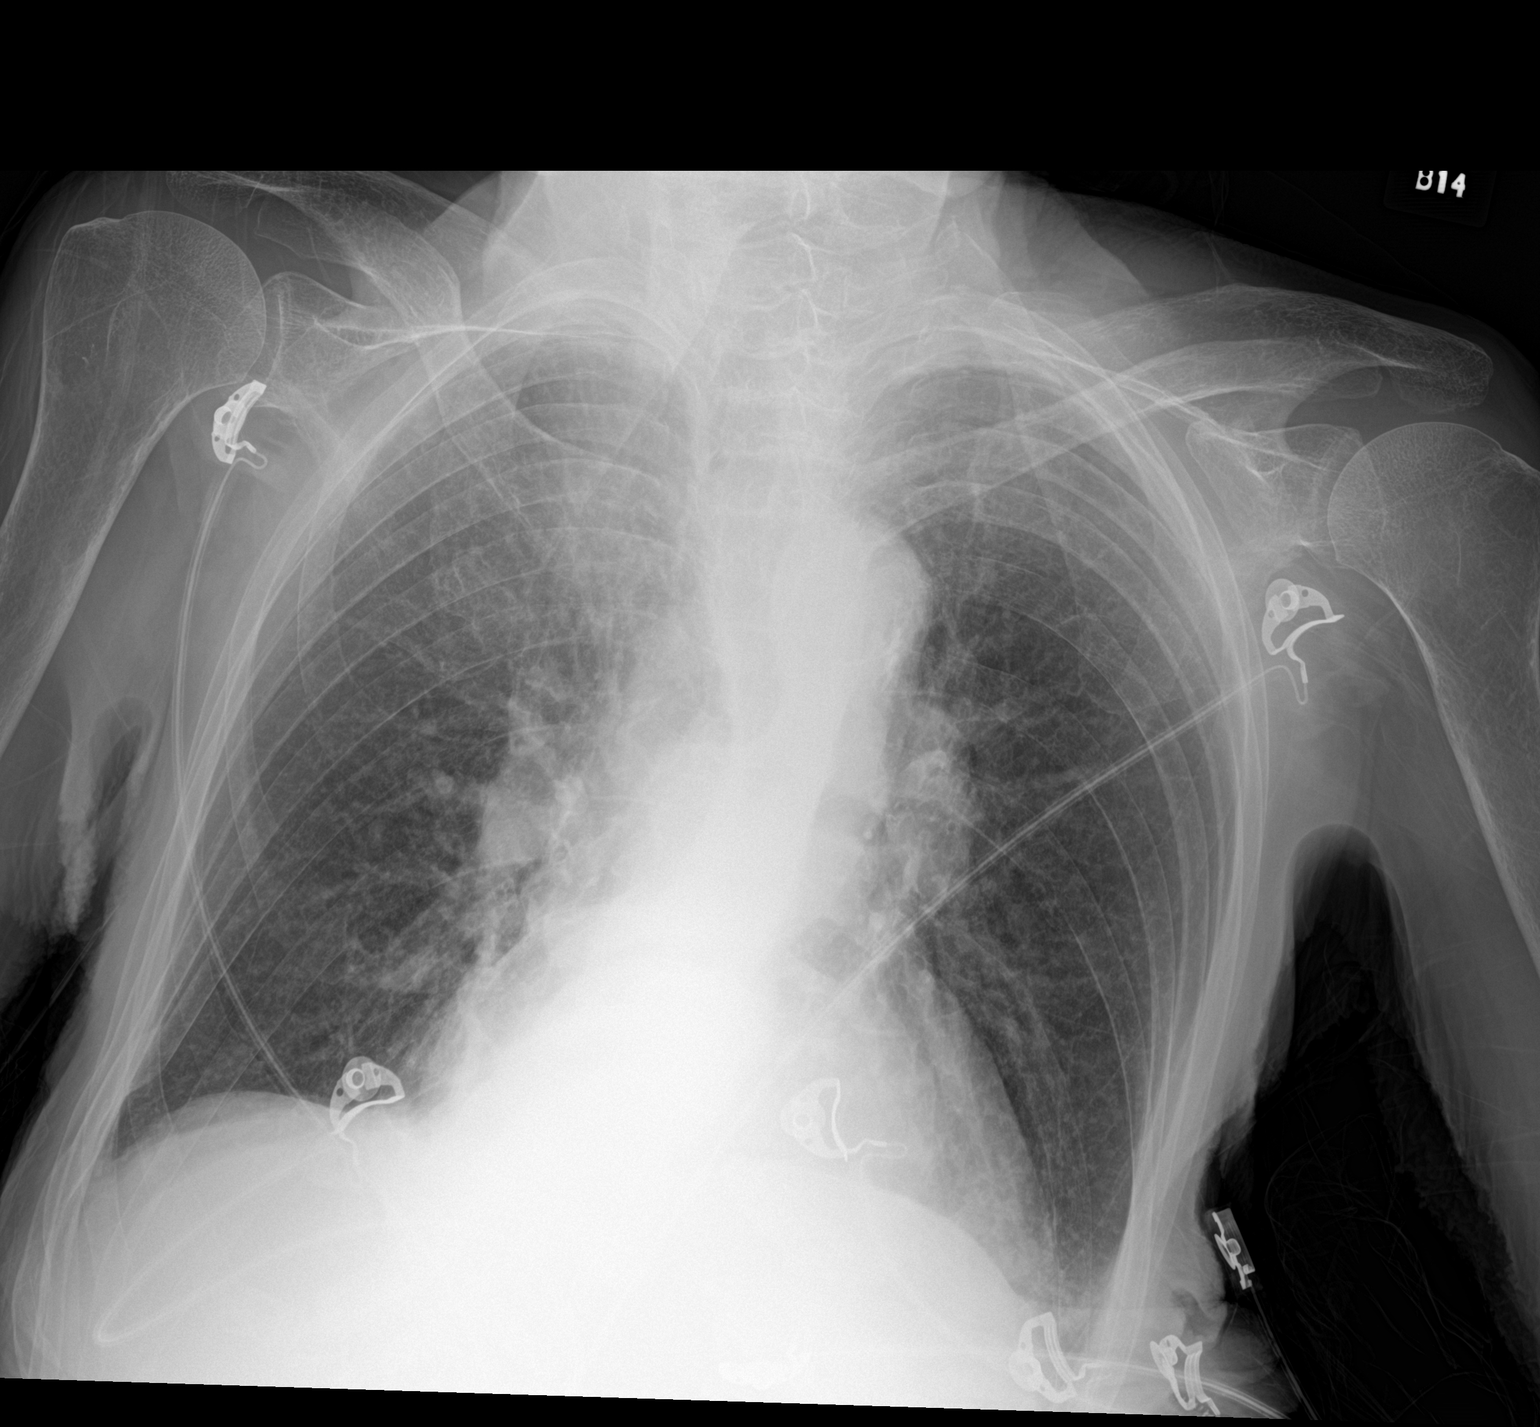

[1 of 1 positions shown; findings below may reference images not displayed]

FINDINGS: Chronic cardiomegaly with aortic tortuosity. Lower mediastinal mass
from hiatal hernia based on prior. Large lung volumes with mild
diaphragm flattening. There is no edema, consolidation, effusion, or
pneumothorax.. Hazy increased density at the medial right apex is
attributed to technique.
IMPRESSION: Chronic cardiomegaly.  Negative for failure.

## 2019-03-29 DEATH — deceased

## 2019-05-07 ENCOUNTER — Ambulatory Visit: Payer: Medicare Other | Admitting: Internal Medicine

## 2019-05-07 ENCOUNTER — Ambulatory Visit: Payer: Medicare Other
